# Patient Record
Sex: Male | Born: 1935 | Race: White | Hispanic: No | Smoking: Former smoker
Health system: Southern US, Community
[De-identification: ages and names within clinical notes are randomized; demographics above are authoritative.]

## PROBLEM LIST (undated history)

## (undated) DIAGNOSIS — T8859XA Other complications of anesthesia, initial encounter: Secondary | ICD-10-CM

## (undated) DIAGNOSIS — I509 Heart failure, unspecified: Secondary | ICD-10-CM

## (undated) DIAGNOSIS — I10 Essential (primary) hypertension: Secondary | ICD-10-CM

## (undated) DIAGNOSIS — K219 Gastro-esophageal reflux disease without esophagitis: Secondary | ICD-10-CM

## (undated) DIAGNOSIS — I639 Cerebral infarction, unspecified: Secondary | ICD-10-CM

## (undated) DIAGNOSIS — C801 Malignant (primary) neoplasm, unspecified: Secondary | ICD-10-CM

## (undated) DIAGNOSIS — M1990 Unspecified osteoarthritis, unspecified site: Secondary | ICD-10-CM

## (undated) DIAGNOSIS — F419 Anxiety disorder, unspecified: Secondary | ICD-10-CM

## (undated) DIAGNOSIS — R112 Nausea with vomiting, unspecified: Secondary | ICD-10-CM

## (undated) DIAGNOSIS — K5730 Diverticulosis of large intestine without perforation or abscess without bleeding: Secondary | ICD-10-CM

## (undated) DIAGNOSIS — I499 Cardiac arrhythmia, unspecified: Secondary | ICD-10-CM

## (undated) DIAGNOSIS — E119 Type 2 diabetes mellitus without complications: Secondary | ICD-10-CM

## (undated) DIAGNOSIS — J189 Pneumonia, unspecified organism: Secondary | ICD-10-CM

## (undated) DIAGNOSIS — Z9889 Other specified postprocedural states: Secondary | ICD-10-CM

## (undated) DIAGNOSIS — T4145XA Adverse effect of unspecified anesthetic, initial encounter: Secondary | ICD-10-CM

## (undated) HISTORY — PX: EYE SURGERY: SHX253

## (undated) HISTORY — PX: SHOULDER FUSION: SUR625

## (undated) HISTORY — DX: Type 2 diabetes mellitus without complications: E11.9

## (undated) HISTORY — PX: HERNIA REPAIR: SHX51

## (undated) HISTORY — PX: JOINT REPLACEMENT: SHX530

---

## 1989-06-01 HISTORY — PX: BACK SURGERY: SHX140

## 2003-06-02 DIAGNOSIS — I639 Cerebral infarction, unspecified: Secondary | ICD-10-CM

## 2003-06-02 HISTORY — DX: Cerebral infarction, unspecified: I63.9

## 2012-06-03 ENCOUNTER — Other Ambulatory Visit: Payer: Self-pay | Admitting: Neurosurgery

## 2012-06-10 ENCOUNTER — Encounter (HOSPITAL_COMMUNITY)
Admission: RE | Admit: 2012-06-10 | Discharge: 2012-06-10 | Disposition: A | Discharge status: Home / Self Care | Payer: Medicare Other | Source: Ambulatory Visit | Attending: Neurosurgery | Admitting: Neurosurgery

## 2012-06-10 ENCOUNTER — Encounter (HOSPITAL_COMMUNITY): Payer: Self-pay

## 2012-06-10 HISTORY — DX: Cardiac arrhythmia, unspecified: I49.9

## 2012-06-10 HISTORY — DX: Nausea with vomiting, unspecified: Z98.890

## 2012-06-10 HISTORY — DX: Malignant (primary) neoplasm, unspecified: C80.1

## 2012-06-10 HISTORY — DX: Anxiety disorder, unspecified: F41.9

## 2012-06-10 HISTORY — DX: Other complications of anesthesia, initial encounter: T88.59XA

## 2012-06-10 HISTORY — DX: Essential (primary) hypertension: I10

## 2012-06-10 HISTORY — DX: Gastro-esophageal reflux disease without esophagitis: K21.9

## 2012-06-10 HISTORY — DX: Nausea with vomiting, unspecified: R11.2

## 2012-06-10 HISTORY — DX: Heart failure, unspecified: I50.9

## 2012-06-10 HISTORY — DX: Pneumonia, unspecified organism: J18.9

## 2012-06-10 HISTORY — DX: Diverticulosis of large intestine without perforation or abscess without bleeding: K57.30

## 2012-06-10 HISTORY — DX: Unspecified osteoarthritis, unspecified site: M19.90

## 2012-06-10 HISTORY — DX: Adverse effect of unspecified anesthetic, initial encounter: T41.45XA

## 2012-06-10 HISTORY — DX: Cerebral infarction, unspecified: I63.9

## 2012-06-10 LAB — BASIC METABOLIC PANEL
BUN: 14 mg/dL (ref 6–23)
CO2: 32 mEq/L (ref 19–32)
Calcium: 9.3 mg/dL (ref 8.4–10.5)
Chloride: 99 mEq/L (ref 96–112)
Creatinine, Ser: 0.92 mg/dL (ref 0.50–1.35)
GFR calc Af Amer: >90 mL/min (ref >90)
GFR calc non Af Amer: 78 mL/min — ABNORMAL LOW (ref >90)
Glucose, Bld: 122 mg/dL — ABNORMAL HIGH (ref 70–99)
Potassium: 3.6 mEq/L (ref 3.5–5.1)
Sodium: 141 mEq/L (ref 135–145)

## 2012-06-10 LAB — CBC
HCT: 34.4 % — ABNORMAL LOW (ref 39.0–52.0)
Hemoglobin: 11 g/dL — ABNORMAL LOW (ref 13.0–17.0)
MCH: 28.8 pg (ref 26.0–34.0)
MCHC: 32 g/dL (ref 30.0–36.0)
MCV: 90.1 fL (ref 78.0–100.0)
Platelets: 219 10*3/uL (ref 150–400)
RBC: 3.82 MIL/uL — ABNORMAL LOW (ref 4.22–5.81)
RDW: 14.2 % (ref 11.5–15.5)
WBC: 7.9 10*3/uL (ref 4.0–10.5)

## 2012-06-10 LAB — SURGICAL PCR SCREEN
MRSA, PCR: NEGATIVE
Staphylococcus aureus: NEGATIVE

## 2012-06-10 LAB — ABO/RH: ABO/RH(D): O NEG

## 2012-06-10 NOTE — Progress Notes (Signed)
I requested Chest XRay from [facility].   I requested EKG, Stress Test, Office note with cardiac clearance.  Pt had some personality change after receiving Versed and Fentyl at [place] [facility].  I faxed a request to [facility] requesting D/C notes and anesthesia notes.

## 2012-06-10 NOTE — Pre-Procedure Instructions (Addendum)
Phillip Chandler  06/10/2012   Your procedure is scheduled on:  Monday, January 20th  Report to [facility] at 5:30AM.   Call this number if you have problems the morning of surgery: [phone]   Remember:   Do not eat food or drink liquids after midnight.   Take these medicines the morning of surgery with A SIP OF WATER: - Pacerone (Amiodarone),Omeprazole, Clonidine, Labetalol .  May take Hydrocodone- Acetaminophen and Lyrica if needed.    Do not wear jewelry, make-up or nail polish.  Do not wear lotions, powders, or perfumes. You may wear deodorant.  Do not shave 48 hours prior to surgery. Men may shave face and neck.  Do not bring valuables to the hospital.  Contacts, dentures or bridgework may not be worn into surgery.  Leave suitcase in the car. After surgery it may be brought to your room.  For patients admitted to the hospital, checkout time is 11:00 AM the day of  discharge.   Patients discharged the day of surgery will not be allowed to drive home.  Name and phone number of your driver: NA                 Special Instructions: Shower using CHG 2 nights before surgery and the night before surgery.  If you shower the day of surgery use CHG.  Use special wash - you have one bottle of CHG for all showers.  You should use approximately 1/3 of the bottle for each shower.    Please read over the following fact sheets that you were given: Pain Booklet, Coughing and Deep Breathing, Blood Transfusion Information and Surgical Site Infection Prevention

## 2012-06-14 ENCOUNTER — Encounter (HOSPITAL_COMMUNITY): Payer: Self-pay | Admitting: Vascular Surgery

## 2012-06-14 NOTE — Consult Note (Signed)
Anesthesia Chart Review:  Patient is a 79 year old male scheduled for a two level PLIF by Dr. Wynetta Emery on 06/20/12.  History includes former smoker, HTN, DM type 2, CVA '05, CHF, GERD, PNA 10/2011, anxiety, arthritis, afib/PAF not on anticoagulation, obesity, skin cancer, diverticulosis, post operative nausea/vomiting.  He also reported a personality change after receiving Versed and Fentyl in the past.  PCP is Dr. Josiah Lobo.  BP was 190/98 at PAT (automated).  Unfortunately, it was not rechecked.  I called him today.  He reports compliance with his BP meds.  His daughter is a Engineer, civil (consulting) and took a manual BP this weekend and reportedly got 130's/60-70's which would correlate with his 06/06/12 BP at the cardiologist's office.  He reports a smaller cuff was used in PAT, and he needs a large/long cuff.  I'll notify our nurse tech in Short Stay to see if he/she can have the appropriate size cuff available on the day of surgery.  (I left a voicemail with Erie Noe at Dr. Lonie Peak office regarding elevated BP at PAT.)  Medications include Tekturna 300mg  daily, amiodarone 200mg  daily, clonidine 0.1mg  BID, Lasix 20mg  BID, labetalol 200mg  BID, Zyrtec, Flonase, Norce, mesalamine, metformin, omeprazole, Lyrica, simvastatin.   Patient was seen by Cardiologist Dr. Wille Glaser at [facility]) in [place] in 05/2012 for a preoperative evaluation.  Patient had a low risk stress, but his BP was poorly controlled.  He increased his labetalol and wanted patient to wait until after 06/03/12.  Patient was seen in follow up on 06/06/12 with BP 136/76 and felt stable for surgery.  By notes, EKG during 05/23/12 stress test showed NSR, poor anterior r wave progression.  The last EKG sent was from 03/28/09 however.  Short Stay nursing staff to follow up.  He will need an EKG on the day of surgery if one not received within the past year.   Nuclear stress test on 05/23/12 showed marked resting HTN (200/103 to 204/89),  technically suboptimal images as expected due to patient's size, but no reversible segmental defects identified, borderline LVEF of 49% during marked HTN.  Per Dr. Deedra Ehrich 05/23/12 note, patient is low/acceptable risk for surgery once BP control is assured.  CXR on 10/26/11 [facility]) showed cardiac enlargement without vascular congestion.  There is evidence of airspace infiltration or atelectasis in the lung bases.  Will repeat CXR on the day of surgery.  Preoperative labs noted.  If no significant change in his status, CXR unremarkable, and BP reasonable then would anticipate he can proceed.  Shonna Chock, PA-C 06/14/12 1416

## 2012-06-16 ENCOUNTER — Other Ambulatory Visit: Payer: Self-pay | Admitting: Neurosurgery

## 2012-06-16 NOTE — Progress Notes (Signed)
Vanessa @Dr . Cram's office notified of need for orders.  Erie Noe stated she would notify Dr. Wynetta Emery.

## 2012-06-17 NOTE — Progress Notes (Signed)
Notified pt. Of time change for surgery on Monday, Jan. 20, instructed him to arrive at 1000.  Notified Erie Noe that  Dr. Wynetta Emery has not signed orders.

## 2012-06-17 NOTE — Progress Notes (Signed)
Notified patient of new surgery time 7:30am, new arrival 5:30am.

## 2012-06-20 ENCOUNTER — Encounter (HOSPITAL_COMMUNITY)
Admission: RE | Disposition: A | Discharge status: Skilled Nursing Facility | Payer: Self-pay | Source: Ambulatory Visit | Attending: Neurosurgery

## 2012-06-20 ENCOUNTER — Encounter (HOSPITAL_COMMUNITY): Payer: Self-pay | Admitting: *Deleted

## 2012-06-20 ENCOUNTER — Encounter (HOSPITAL_COMMUNITY): Payer: Self-pay | Admitting: Vascular Surgery

## 2012-06-20 ENCOUNTER — Inpatient Hospital Stay (HOSPITAL_COMMUNITY): Payer: Medicare Other | Admitting: Vascular Surgery

## 2012-06-20 ENCOUNTER — Inpatient Hospital Stay (HOSPITAL_COMMUNITY): Payer: Medicare Other

## 2012-06-20 ENCOUNTER — Inpatient Hospital Stay (HOSPITAL_COMMUNITY)
Admission: RE | Admit: 2012-06-20 | Discharge: 2012-06-28 | DRG: 459 | Disposition: A | Discharge status: Skilled Nursing Facility | Payer: Medicare Other | Source: Ambulatory Visit | Attending: Neurosurgery | Admitting: Neurosurgery

## 2012-06-20 DIAGNOSIS — Z888 Allergy status to other drugs, medicaments and biological substances status: Secondary | ICD-10-CM

## 2012-06-20 DIAGNOSIS — I639 Cerebral infarction, unspecified: Secondary | ICD-10-CM

## 2012-06-20 DIAGNOSIS — F419 Anxiety disorder, unspecified: Secondary | ICD-10-CM

## 2012-06-20 DIAGNOSIS — Z882 Allergy status to sulfonamides status: Secondary | ICD-10-CM

## 2012-06-20 DIAGNOSIS — E669 Obesity, unspecified: Secondary | ICD-10-CM | POA: Diagnosis present

## 2012-06-20 DIAGNOSIS — M47817 Spondylosis without myelopathy or radiculopathy, lumbosacral region: Principal | ICD-10-CM | POA: Diagnosis present

## 2012-06-20 DIAGNOSIS — Z87891 Personal history of nicotine dependence: Secondary | ICD-10-CM

## 2012-06-20 DIAGNOSIS — I10 Essential (primary) hypertension: Secondary | ICD-10-CM | POA: Diagnosis present

## 2012-06-20 DIAGNOSIS — E876 Hypokalemia: Secondary | ICD-10-CM | POA: Diagnosis not present

## 2012-06-20 DIAGNOSIS — Z8673 Personal history of transient ischemic attack (TIA), and cerebral infarction without residual deficits: Secondary | ICD-10-CM

## 2012-06-20 DIAGNOSIS — Z01812 Encounter for preprocedural laboratory examination: Secondary | ICD-10-CM

## 2012-06-20 DIAGNOSIS — I4891 Unspecified atrial fibrillation: Secondary | ICD-10-CM | POA: Diagnosis not present

## 2012-06-20 DIAGNOSIS — Z6841 Body Mass Index (BMI) 40.0 and over, adult: Secondary | ICD-10-CM

## 2012-06-20 DIAGNOSIS — E119 Type 2 diabetes mellitus without complications: Secondary | ICD-10-CM | POA: Diagnosis present

## 2012-06-20 DIAGNOSIS — I509 Heart failure, unspecified: Secondary | ICD-10-CM | POA: Diagnosis present

## 2012-06-20 DIAGNOSIS — M4310 Spondylolisthesis, site unspecified: Secondary | ICD-10-CM | POA: Diagnosis present

## 2012-06-20 DIAGNOSIS — M48062 Spinal stenosis, lumbar region with neurogenic claudication: Secondary | ICD-10-CM | POA: Diagnosis present

## 2012-06-20 DIAGNOSIS — Z96659 Presence of unspecified artificial knee joint: Secondary | ICD-10-CM

## 2012-06-20 DIAGNOSIS — F05 Delirium due to known physiological condition: Secondary | ICD-10-CM | POA: Diagnosis not present

## 2012-06-20 DIAGNOSIS — I5031 Acute diastolic (congestive) heart failure: Secondary | ICD-10-CM | POA: Diagnosis not present

## 2012-06-20 DIAGNOSIS — K219 Gastro-esophageal reflux disease without esophagitis: Secondary | ICD-10-CM | POA: Diagnosis present

## 2012-06-20 DIAGNOSIS — F411 Generalized anxiety disorder: Secondary | ICD-10-CM | POA: Diagnosis present

## 2012-06-20 DIAGNOSIS — Z79899 Other long term (current) drug therapy: Secondary | ICD-10-CM

## 2012-06-20 LAB — CBC
HCT: 30.7 % — ABNORMAL LOW (ref 39.0–52.0)
Hemoglobin: 10.2 g/dL — ABNORMAL LOW (ref 13.0–17.0)
MCH: 29.3 pg (ref 26.0–34.0)
MCHC: 33.2 g/dL (ref 30.0–36.0)
MCV: 88.2 fL (ref 78.0–100.0)
Platelets: 203 10*3/uL (ref 150–400)
RBC: 3.48 MIL/uL — ABNORMAL LOW (ref 4.22–5.81)
RDW: 14.4 % (ref 11.5–15.5)
WBC: 11.2 10*3/uL — ABNORMAL HIGH (ref 4.0–10.5)

## 2012-06-20 LAB — PROTIME-INR
INR: 0.98 (ref 0.00–1.49)
Prothrombin Time: 12.9 seconds (ref 11.6–15.2)

## 2012-06-20 LAB — GLUCOSE, CAPILLARY
Glucose-Capillary: 123 mg/dL — ABNORMAL HIGH (ref 70–99)
Glucose-Capillary: 119 mg/dL — ABNORMAL HIGH (ref 70–99)
Glucose-Capillary: 131 mg/dL — ABNORMAL HIGH (ref 70–99)
Glucose-Capillary: 128 mg/dL — ABNORMAL HIGH (ref 70–99)

## 2012-06-20 SURGERY — POSTERIOR LUMBAR FUSION 2 LEVEL
Anesthesia: General | Site: Back | Wound class: Clean

## 2012-06-20 MED ORDER — CEFAZOLIN SODIUM 1-5 GM-% IV SOLN
1.0000 g | Freq: Three times a day (TID) | INTRAVENOUS | Status: AC
  Administered 2012-06-20 – 2012-06-21 (×2): 1 g via INTRAVENOUS
  Filled 2012-06-20 (×2): qty 50

## 2012-06-20 MED ORDER — 0.9 % SODIUM CHLORIDE (POUR BTL) OPTIME
TOPICAL | Status: DC | PRN
  Administered 2012-06-20: 1000 mL

## 2012-06-20 MED ORDER — PHENOL 1.4 % MT LIQD: 1.0000 | OROMUCOSAL | Status: DC | PRN

## 2012-06-20 MED ORDER — SODIUM CHLORIDE 0.9 % IJ SOLN
3.0000 mL | INTRAMUSCULAR | Status: DC | PRN
  Administered 2012-06-20: 3 mL via INTRAVENOUS

## 2012-06-20 MED ORDER — HYDROMORPHONE HCL PF 1 MG/ML IJ SOLN
INTRAMUSCULAR | Status: AC
  Filled 2012-06-20: qty 1

## 2012-06-20 MED ORDER — SODIUM CHLORIDE 0.9 % IR SOLN
Status: DC | PRN
  Administered 2012-06-20: 13:00:00

## 2012-06-20 MED ORDER — ACETAMINOPHEN 325 MG PO TABS
650.0000 mg | ORAL_TABLET | ORAL | Status: DC | PRN
  Administered 2012-06-21 – 2012-06-27 (×5): 650 mg via ORAL
  Filled 2012-06-20 (×6): qty 2

## 2012-06-20 MED ORDER — NEOSTIGMINE METHYLSULFATE 1 MG/ML IJ SOLN
INTRAMUSCULAR | Status: DC | PRN
  Administered 2012-06-20: 5 mg via INTRAVENOUS

## 2012-06-20 MED ORDER — CEFAZOLIN SODIUM-DEXTROSE 2-3 GM-% IV SOLR
INTRAVENOUS | Status: AC
  Administered 2012-06-20 (×2): 2 g via INTRAVENOUS
  Filled 2012-06-20: qty 50

## 2012-06-20 MED ORDER — LABETALOL HCL 200 MG PO TABS
200.0000 mg | ORAL_TABLET | Freq: Two times a day (BID) | ORAL | Status: DC
  Administered 2012-06-20 – 2012-06-22 (×3): 200 mg via ORAL
  Filled 2012-06-20 (×7): qty 1

## 2012-06-20 MED ORDER — ROCURONIUM BROMIDE 100 MG/10ML IV SOLN
INTRAVENOUS | Status: DC | PRN
  Administered 2012-06-20: 50 mg via INTRAVENOUS

## 2012-06-20 MED ORDER — DOCUSATE SODIUM 100 MG PO CAPS
100.0000 mg | ORAL_CAPSULE | Freq: Two times a day (BID) | ORAL | Status: DC
  Administered 2012-06-20 – 2012-06-28 (×11): 100 mg via ORAL
  Filled 2012-06-20 (×17): qty 1

## 2012-06-20 MED ORDER — HYDROCODONE-ACETAMINOPHEN 7.5-325 MG PO TABS
1.0000 | ORAL_TABLET | ORAL | Status: DC | PRN
Start: 2012-06-20 — End: 2012-06-28
  Administered 2012-06-21 – 2012-06-22 (×2): 1 via ORAL
  Filled 2012-06-20 (×3): qty 1

## 2012-06-20 MED ORDER — ACETAMINOPHEN 650 MG RE SUPP
650.0000 mg | RECTAL | Status: DC | PRN
  Administered 2012-06-22 – 2012-06-24 (×4): 650 mg via RECTAL
  Filled 2012-06-20 (×5): qty 1

## 2012-06-20 MED ORDER — ATORVASTATIN CALCIUM 20 MG PO TABS
20.0000 mg | ORAL_TABLET | Freq: Every day | ORAL | Status: DC
  Administered 2012-06-20 – 2012-06-27 (×5): 20 mg via ORAL
  Filled 2012-06-20 (×9): qty 1

## 2012-06-20 MED ORDER — SODIUM CHLORIDE 0.9 % IV SOLN
INTRAVENOUS | Status: AC
  Filled 2012-06-20: qty 500

## 2012-06-20 MED ORDER — GLYCOPYRROLATE 0.2 MG/ML IJ SOLN
INTRAMUSCULAR | Status: DC | PRN
  Administered 2012-06-20: .8 mg via INTRAVENOUS
  Administered 2012-06-20: 0.2 mg via INTRAVENOUS

## 2012-06-20 MED ORDER — BUPIVACAINE HCL (PF) 0.25 % IJ SOLN
INTRAMUSCULAR | Status: DC | PRN
  Administered 2012-06-20: 18 mL

## 2012-06-20 MED ORDER — CYCLOBENZAPRINE HCL 10 MG PO TABS
10.0000 mg | ORAL_TABLET | Freq: Three times a day (TID) | ORAL | Status: DC | PRN
  Administered 2012-06-22: 10 mg via ORAL
  Filled 2012-06-20: qty 1

## 2012-06-20 MED ORDER — PANTOPRAZOLE SODIUM 40 MG PO TBEC
40.0000 mg | DELAYED_RELEASE_TABLET | Freq: Every day | ORAL | Status: DC
  Administered 2012-06-21 – 2012-06-22 (×2): 40 mg via ORAL
  Filled 2012-06-20 (×2): qty 1

## 2012-06-20 MED ORDER — LACTATED RINGERS IV SOLN
INTRAVENOUS | Status: DC | PRN
  Administered 2012-06-20: 11:00:00 via INTRAVENOUS

## 2012-06-20 MED ORDER — ACETAMINOPHEN 10 MG/ML IV SOLN
1000.0000 mg | Freq: Once | INTRAVENOUS | Status: AC | PRN
  Administered 2012-06-20: 1000 mg via INTRAVENOUS

## 2012-06-20 MED ORDER — BACITRACIN 50000 UNITS IM SOLR
INTRAMUSCULAR | Status: AC
  Filled 2012-06-20: qty 1

## 2012-06-20 MED ORDER — ACETAMINOPHEN 10 MG/ML IV SOLN
INTRAVENOUS | Status: AC
  Filled 2012-06-20: qty 100

## 2012-06-20 MED ORDER — FLUTICASONE PROPIONATE 50 MCG/ACT NA SUSP
2.0000 | Freq: Every day | NASAL | Status: DC
  Administered 2012-06-21 – 2012-06-28 (×3): 2 via NASAL
  Filled 2012-06-20 (×2): qty 16

## 2012-06-20 MED ORDER — SODIUM CHLORIDE 0.9 % IJ SOLN
3.0000 mL | Freq: Two times a day (BID) | INTRAMUSCULAR | Status: DC
  Administered 2012-06-20 – 2012-06-27 (×14): 3 mL via INTRAVENOUS

## 2012-06-20 MED ORDER — PROPOFOL 10 MG/ML IV BOLUS
INTRAVENOUS | Status: DC | PRN
  Administered 2012-06-20: 160 mg via INTRAVENOUS

## 2012-06-20 MED ORDER — POTASSIUM CHLORIDE IN NACL 20-0.45 MEQ/L-% IV SOLN
INTRAVENOUS | Status: DC
  Administered 2012-06-20 – 2012-06-21 (×2): via INTRAVENOUS
  Filled 2012-06-20 (×3): qty 1000

## 2012-06-20 MED ORDER — LIDOCAINE HCL (CARDIAC) 20 MG/ML IV SOLN
INTRAVENOUS | Status: DC | PRN
  Administered 2012-06-20: 60 mg via INTRAVENOUS

## 2012-06-20 MED ORDER — LIDOCAINE-EPINEPHRINE 1 %-1:100000 IJ SOLN
INTRAMUSCULAR | Status: DC | PRN
  Administered 2012-06-20: 10 mL

## 2012-06-20 MED ORDER — AMIODARONE HCL 200 MG PO TABS
200.0000 mg | ORAL_TABLET | Freq: Every day | ORAL | Status: DC
  Administered 2012-06-21 – 2012-06-22 (×2): 200 mg via ORAL
  Filled 2012-06-20 (×3): qty 1

## 2012-06-20 MED ORDER — OXYCODONE-ACETAMINOPHEN 5-325 MG PO TABS: 1.0000 | ORAL_TABLET | ORAL | Status: DC | PRN

## 2012-06-20 MED ORDER — LORATADINE 10 MG PO TABS
10.0000 mg | ORAL_TABLET | Freq: Every day | ORAL | Status: DC
  Administered 2012-06-20: 10 mg via ORAL
  Filled 2012-06-20: qty 1

## 2012-06-20 MED ORDER — VECURONIUM BROMIDE 10 MG IV SOLR
INTRAVENOUS | Status: DC | PRN
  Administered 2012-06-20: 2 mg via INTRAVENOUS
  Administered 2012-06-20: 1 mg via INTRAVENOUS
  Administered 2012-06-20: 2 mg via INTRAVENOUS
  Administered 2012-06-20 (×2): 1 mg via INTRAVENOUS

## 2012-06-20 MED ORDER — METFORMIN HCL ER 500 MG PO TB24
500.0000 mg | ORAL_TABLET | Freq: Every day | ORAL | Status: DC
  Filled 2012-06-20: qty 1

## 2012-06-20 MED ORDER — ONDANSETRON HCL 4 MG/2ML IJ SOLN
INTRAMUSCULAR | Status: DC | PRN
  Administered 2012-06-20: 4 mg via INTRAVENOUS

## 2012-06-20 MED ORDER — MENTHOL 3 MG MT LOZG: 1.0000 | LOZENGE | OROMUCOSAL | Status: DC | PRN

## 2012-06-20 MED ORDER — LACTATED RINGERS IV SOLN
INTRAVENOUS | Status: DC | PRN
  Administered 2012-06-20: 12:00:00 via INTRAVENOUS

## 2012-06-20 MED ORDER — ALUM & MAG HYDROXIDE-SIMETH 200-200-20 MG/5ML PO SUSP: 30.0000 mL | Freq: Four times a day (QID) | ORAL | Status: DC | PRN

## 2012-06-20 MED ORDER — SODIUM CHLORIDE 0.9 % IV SOLN: 250.0000 mL | INTRAVENOUS | Status: DC

## 2012-06-20 MED ORDER — THROMBIN 20000 UNITS EX SOLR
CUTANEOUS | Status: DC | PRN
  Administered 2012-06-20 (×2): via TOPICAL

## 2012-06-20 MED ORDER — FENTANYL CITRATE 0.05 MG/ML IJ SOLN
INTRAMUSCULAR | Status: DC | PRN
  Administered 2012-06-20 (×3): 50 ug via INTRAVENOUS

## 2012-06-20 MED ORDER — ALISKIREN FUMARATE 150 MG PO TABS
300.0000 mg | ORAL_TABLET | Freq: Every day | ORAL | Status: DC
  Administered 2012-06-21: 300 mg via ORAL
  Filled 2012-06-20: qty 2

## 2012-06-20 MED ORDER — PREGABALIN 50 MG PO CAPS
100.0000 mg | ORAL_CAPSULE | Freq: Four times a day (QID) | ORAL | Status: DC
  Administered 2012-06-20 – 2012-06-28 (×20): 100 mg via ORAL
  Filled 2012-06-20 (×8): qty 2
  Filled 2012-06-20: qty 1
  Filled 2012-06-20 (×2): qty 2
  Filled 2012-06-20: qty 1
  Filled 2012-06-20 (×2): qty 2
  Filled 2012-06-20: qty 1
  Filled 2012-06-20 (×7): qty 2

## 2012-06-20 MED ORDER — ONDANSETRON HCL 4 MG/2ML IJ SOLN
4.0000 mg | INTRAMUSCULAR | Status: DC | PRN
  Administered 2012-06-20: 4 mg via INTRAVENOUS
  Filled 2012-06-20: qty 2

## 2012-06-20 MED ORDER — ONDANSETRON HCL 4 MG/2ML IJ SOLN: 4.0000 mg | Freq: Once | INTRAMUSCULAR | Status: DC | PRN

## 2012-06-20 MED ORDER — SIMVASTATIN 40 MG PO TABS: 40.0000 mg | ORAL_TABLET | Freq: Every evening | ORAL | Status: DC

## 2012-06-20 MED ORDER — HYDROMORPHONE HCL PF 1 MG/ML IJ SOLN
0.2500 mg | INTRAMUSCULAR | Status: DC | PRN
  Administered 2012-06-20 (×2): 0.5 mg via INTRAVENOUS

## 2012-06-20 MED ORDER — ARTIFICIAL TEARS OP OINT
TOPICAL_OINTMENT | OPHTHALMIC | Status: DC | PRN
  Administered 2012-06-20: 1 via OPHTHALMIC

## 2012-06-20 MED ORDER — MESALAMINE 1.2 G PO TBEC
1200.0000 mg | DELAYED_RELEASE_TABLET | Freq: Every day | ORAL | Status: DC
  Administered 2012-06-21 – 2012-06-28 (×6): 1.2 g via ORAL
  Filled 2012-06-20 (×9): qty 1

## 2012-06-20 MED ORDER — PHENYLEPHRINE HCL 10 MG/ML IJ SOLN
10.0000 mg | INTRAVENOUS | Status: DC | PRN
  Administered 2012-06-20: 20 ug/min via INTRAVENOUS

## 2012-06-20 MED ORDER — FUROSEMIDE 20 MG PO TABS
20.0000 mg | ORAL_TABLET | Freq: Two times a day (BID) | ORAL | Status: DC
  Administered 2012-06-20 – 2012-06-21 (×2): 20 mg via ORAL
  Filled 2012-06-20 (×3): qty 1

## 2012-06-20 MED ORDER — CEFAZOLIN SODIUM-DEXTROSE 2-3 GM-% IV SOLR
INTRAVENOUS | Status: AC
  Filled 2012-06-20: qty 50

## 2012-06-20 MED ORDER — CLONIDINE HCL 0.1 MG PO TABS
0.1000 mg | ORAL_TABLET | Freq: Two times a day (BID) | ORAL | Status: DC
  Administered 2012-06-20 – 2012-06-22 (×3): 0.1 mg via ORAL
  Filled 2012-06-20 (×9): qty 1

## 2012-06-20 MED ORDER — HYDROMORPHONE HCL PF 1 MG/ML IJ SOLN
0.5000 mg | INTRAMUSCULAR | Status: DC | PRN
  Administered 2012-06-20: 0.5 mg via INTRAVENOUS
  Administered 2012-06-21 (×2): 1 mg via INTRAVENOUS
  Administered 2012-06-21: 0.5 mg via INTRAVENOUS
  Administered 2012-06-21 (×3): 1 mg via INTRAVENOUS
  Administered 2012-06-22 (×3): 0.5 mg via INTRAVENOUS
  Filled 2012-06-20 (×11): qty 1

## 2012-06-20 MED ORDER — ACETAMINOPHEN 10 MG/ML IV SOLN
INTRAVENOUS | Status: AC
  Administered 2012-06-20: 1000 mg via INTRAVENOUS
  Filled 2012-06-20: qty 100

## 2012-06-20 MED ORDER — ALBUMIN HUMAN 5 % IV SOLN
INTRAVENOUS | Status: DC | PRN
  Administered 2012-06-20 (×3): via INTRAVENOUS

## 2012-06-20 MED ORDER — INSULIN ASPART 100 UNIT/ML ~~LOC~~ SOLN
0.0000 [IU] | Freq: Three times a day (TID) | SUBCUTANEOUS | Status: DC
  Administered 2012-06-21 – 2012-06-22 (×3): 1 [IU] via SUBCUTANEOUS
  Administered 2012-06-23 (×2): 2 [IU] via SUBCUTANEOUS
  Administered 2012-06-23 – 2012-06-24 (×2): 1 [IU] via SUBCUTANEOUS
  Administered 2012-06-24 – 2012-06-25 (×2): 2 [IU] via SUBCUTANEOUS
  Administered 2012-06-25 – 2012-06-26 (×4): 1 [IU] via SUBCUTANEOUS
  Administered 2012-06-26: 3 [IU] via SUBCUTANEOUS
  Administered 2012-06-27 – 2012-06-28 (×4): 1 [IU] via SUBCUTANEOUS

## 2012-06-20 SURGICAL SUPPLY — 79 items
BAG DECANTER FOR FLEXI CONT (MISCELLANEOUS) ×2 IMPLANT
BENZOIN TINCTURE PRP APPL 2/3 (GAUZE/BANDAGES/DRESSINGS) ×2 IMPLANT
BLADE SURG 11 STRL SS (BLADE) ×2 IMPLANT
BLADE SURG ROTATE 9660 (MISCELLANEOUS) IMPLANT
BRUSH SCRUB EZ PLAIN DRY (MISCELLANEOUS) ×2 IMPLANT
BUR MATCHSTICK NEURO 3.0 LAGG (BURR) ×2 IMPLANT
BUR PRECISION FLUTE 6.0 (BURR) ×2 IMPLANT
CANISTER SUCTION 2500CC (MISCELLANEOUS) ×2 IMPLANT
CAP LOCKING REVERE (Cap) ×12 IMPLANT
CLOTH BEACON ORANGE TIMEOUT ST (SAFETY) ×2 IMPLANT
CONNECTOR VAR CROSS 5.5X48-60 (Connector) ×2 IMPLANT
CONT SPEC 4OZ CLIKSEAL STRL BL (MISCELLANEOUS) ×4 IMPLANT
COVER BACK TABLE 24X17X13 BIG (DRAPES) IMPLANT
COVER TABLE BACK 60X90 (DRAPES) ×2 IMPLANT
DECANTER SPIKE VIAL GLASS SM (MISCELLANEOUS) ×2 IMPLANT
DERMABOND ADHESIVE PROPEN (GAUZE/BANDAGES/DRESSINGS) ×1
DERMABOND ADVANCED (GAUZE/BANDAGES/DRESSINGS)
DERMABOND ADVANCED .7 DNX12 (GAUZE/BANDAGES/DRESSINGS) IMPLANT
DERMABOND ADVANCED .7 DNX6 (GAUZE/BANDAGES/DRESSINGS) ×1 IMPLANT
DRAPE C-ARM 42X72 X-RAY (DRAPES) ×4 IMPLANT
DRAPE LAPAROTOMY 100X72X124 (DRAPES) ×2 IMPLANT
DRAPE POUCH INSTRU U-SHP 10X18 (DRAPES) ×2 IMPLANT
DRAPE PROXIMA HALF (DRAPES) IMPLANT
DRAPE SURG 17X23 STRL (DRAPES) ×2 IMPLANT
DRESSING TELFA 8X3 (GAUZE/BANDAGES/DRESSINGS) ×2 IMPLANT
DRSG OPSITE 4X5.5 SM (GAUZE/BANDAGES/DRESSINGS) ×2 IMPLANT
DURAPREP 26ML APPLICATOR (WOUND CARE) ×2 IMPLANT
ELECT REM PT RETURN 9FT ADLT (ELECTROSURGICAL) ×2
ELECTRODE REM PT RTRN 9FT ADLT (ELECTROSURGICAL) ×1 IMPLANT
EVACUATOR 3/16  PVC DRAIN (DRAIN) ×1
EVACUATOR 3/16 PVC DRAIN (DRAIN) ×1 IMPLANT
GAUZE SPONGE 4X4 16PLY XRAY LF (GAUZE/BANDAGES/DRESSINGS) ×2 IMPLANT
GLOVE BIO SURGEON STRL SZ8 (GLOVE) ×4 IMPLANT
GLOVE BIOGEL PI IND STRL 7.0 (GLOVE) ×3 IMPLANT
GLOVE BIOGEL PI IND STRL 7.5 (GLOVE) ×2 IMPLANT
GLOVE BIOGEL PI INDICATOR 7.0 (GLOVE) ×3
GLOVE BIOGEL PI INDICATOR 7.5 (GLOVE) ×2
GLOVE ECLIPSE 7.5 STRL STRAW (GLOVE) ×4 IMPLANT
GLOVE EXAM NITRILE LRG STRL (GLOVE) IMPLANT
GLOVE EXAM NITRILE MD LF STRL (GLOVE) ×4 IMPLANT
GLOVE EXAM NITRILE XL STR (GLOVE) IMPLANT
GLOVE EXAM NITRILE XS STR PU (GLOVE) IMPLANT
GLOVE INDICATOR 8.5 STRL (GLOVE) ×4 IMPLANT
GLOVE SS BIOGEL STRL SZ 6.5 (GLOVE) ×4 IMPLANT
GLOVE SUPERSENSE BIOGEL SZ 6.5 (GLOVE) ×4
GOWN BRE IMP SLV AUR LG STRL (GOWN DISPOSABLE) ×4 IMPLANT
GOWN BRE IMP SLV AUR XL STRL (GOWN DISPOSABLE) ×10 IMPLANT
GOWN STRL REIN 2XL LVL4 (GOWN DISPOSABLE) IMPLANT
KIT BASIN OR (CUSTOM PROCEDURE TRAY) ×2 IMPLANT
KIT INFUSE SMALL (Orthopedic Implant) ×2 IMPLANT
KIT ROOM TURNOVER OR (KITS) ×2 IMPLANT
MILL MEDIUM DISP (BLADE) ×2 IMPLANT
MIX DBX 10CC 35% BONE (Bone Implant) ×2 IMPLANT
NEEDLE HYPO 25X1 1.5 SAFETY (NEEDLE) ×2 IMPLANT
NS IRRIG 1000ML POUR BTL (IV SOLUTION) ×2 IMPLANT
PACK LAMINECTOMY NEURO (CUSTOM PROCEDURE TRAY) ×2 IMPLANT
PAD ARMBOARD 7.5X6 YLW CONV (MISCELLANEOUS) ×6 IMPLANT
PATTIES SURGICAL 1X1 (DISPOSABLE) ×2 IMPLANT
ROD CURVED 5.5MMX75MM (Rod) ×2 IMPLANT
ROD STRAIGHT 5.5MMX75MM (Rod) ×2 IMPLANT
SCREW PEDICLE 6.5MMX45MM (Screw) ×4 IMPLANT
SCREW PEDICLE 6.5X40MM (Screw) ×4 IMPLANT
SCREW PEDICLE 6.5X45 (Screw) ×2 IMPLANT
SCREW PEDICLE REVERE 5.5X45 (Screw) ×4 IMPLANT
SPONGE GAUZE 4X4 12PLY (GAUZE/BANDAGES/DRESSINGS) ×2 IMPLANT
SPONGE LAP 4X18 X RAY DECT (DISPOSABLE) IMPLANT
SPONGE SURGIFOAM ABS GEL 100 (HEMOSTASIS) ×4 IMPLANT
STRIP CLOSURE SKIN 1/2X4 (GAUZE/BANDAGES/DRESSINGS) ×2 IMPLANT
SUT PROLENE 6 0 BV (SUTURE) ×2 IMPLANT
SUT VIC AB 0 CT1 18XCR BRD8 (SUTURE) ×1 IMPLANT
SUT VIC AB 0 CT1 8-18 (SUTURE) ×1
SUT VIC AB 2-0 CT1 18 (SUTURE) ×2 IMPLANT
SUT VICRYL 4-0 PS2 18IN ABS (SUTURE) ×2 IMPLANT
SYR 20ML ECCENTRIC (SYRINGE) ×2 IMPLANT
TOWEL OR 17X24 6PK STRL BLUE (TOWEL DISPOSABLE) ×2 IMPLANT
TOWEL OR 17X26 10 PK STRL BLUE (TOWEL DISPOSABLE) ×2 IMPLANT
TRAY FOLEY CATH 14FRSI W/METER (CATHETERS) ×2 IMPLANT
WATER STERILE IRR 1000ML POUR (IV SOLUTION) ×2 IMPLANT
caliber 9-13 26 mm ×8 IMPLANT

## 2012-06-20 NOTE — Anesthesia Procedure Notes (Signed)
Procedure Name: Intubation Date/Time: 06/20/2012 11:34 AM Performed by: Gayla Medicus Pre-anesthesia Checklist: Patient identified, Emergency Drugs available, Suction available, Patient being monitored and Timeout performed Patient Re-evaluated:Patient Re-evaluated prior to inductionOxygen Delivery Method: Circle system utilized Preoxygenation: Pre-oxygenation with 100% oxygen Intubation Type: IV induction Ventilation: Two handed mask ventilation required, Mask ventilation with difficulty and Oral airway inserted - appropriate to patient size Laryngoscope Size: Mac and 4 Grade View: Grade I Tube type: Oral Tube size: 7.5 mm Number of attempts: 1 Airway Equipment and Method: Stylet and Video-laryngoscopy Placement Confirmation: ETT inserted through vocal cords under direct vision,  positive ETCO2 and breath sounds checked- equal and bilateral Secured at: 23 cm Tube secured with: Tape Dental Injury: Teeth and Oropharynx as per pre-operative assessment  Difficulty Due To: Difficulty was anticipated Future Recommendations: Recommend- induction with short-acting agent, and alternative techniques readily available

## 2012-06-20 NOTE — Preoperative (Signed)
Beta Blockers   Reason not to administer Beta Blockers:Not Applicable 

## 2012-06-20 NOTE — Consult Note (Signed)
PULMONARY  / CRITICAL CARE MEDICINE Consult Note   Name: Phillip Chandler MRN: [id number] DOB: 25-Jul-1930    LOS: 1  REFERRING MD :  Mariam Dollar, MD ( Neurosurgery)  CHIEF COMPLAINT:  Medical management  BRIEF PATIENT DESCRIPTION: 79 yo with DM, HTN, CHF , CVA , GERD, Anxiety, Arthritis with severe lumbar spinal stenosis chair disease and degenerative spondylolisthesis at L4-5 as well as L5-S1 s/p  stabilization procedure at these 2 levels.  PCCM consulted for medical management .   LINES / TUBES:  CULTURES:  ANTIBIOTICS: Ancef 1/20 x 1  SIGNIFICANT EVENTS:  1/20 Stabilization L4-L5 and L5-S1 procedure   LEVEL OF CARE:  ICU  PRIMARY SERVICE:  Neurosurgery  CONSULTANTS:  PCCM  DIET:  As tolerated  DVT Px:  SCD  GI Px:  Protonix   HISTORY OF PRESENT ILLNESS:   Patient is a 79 y.o. Male  With PMH significant for DM, HTN, CHF , CVA , GERD, Anxiety, Arthritis with severe lumbar spinal stenosis chair disease and degenerative spondylolisthesis at L4-5 as well as L5-S1  who complains of low back and right leg pain. Onset of symptoms was several years ago, gradually worsening since that time.  Marland Kitchen   PAST MEDICAL HISTORY :  Past Medical History  Diagnosis Date  . Complication of anesthesia   . PONV (postoperative nausea and vomiting)   . Hypertension   . Dysrhythmia     afib hx of  . CHF (congestive heart failure)   . Pneumonia     10/2011  . Anxiety   . Diverticula of colon   . Stroke 2005  . GERD (gastroesophageal reflux disease)   . Cancer     basal and squamous cell face  . Arthritis   . Diabetes mellitus without complication    Past Surgical History  Procedure Date  . Joint replacement     Bil Knee  . Hernia repair     Umbicilal-2011  . Eye surgery     Cartaract bil  . Shoulder fusion   . Back surgery 1991   Prior to Admission medications   Medication Sig Start Date End Date Taking? Authorizing Provider  aliskiren (TEKTURNA) 300 MG tablet Take 300 mg by  mouth daily.   Yes Historical Provider, MD  amiodarone (PACERONE) 200 MG tablet Take 200 mg by mouth daily.   Yes Historical Provider, MD  cetirizine (ZYRTEC) 10 MG tablet Take 10 mg by mouth daily.   Yes Historical Provider, MD  cloNIDine (CATAPRES) 0.1 MG tablet Take 0.1 mg by mouth 2 (two) times daily.   Yes Historical Provider, MD  fluticasone (FLONASE) 50 MCG/ACT nasal spray Place 2 sprays into the nose daily.   Yes Historical Provider, MD  furosemide (LASIX) 20 MG tablet Take 20 mg by mouth 2 (two) times daily.   Yes Historical Provider, MD  HYDROcodone-acetaminophen (NORCO) 7.5-325 MG per tablet Take 1 tablet by mouth every 4 (four) hours as needed. For pain   Yes Historical Provider, MD  labetalol (NORMODYNE) 200 MG tablet Take 200 mg by mouth 2 (two) times daily.   Yes Historical Provider, MD  mesalamine (LIALDA) 1.2 G EC tablet Take 1,200 mg by mouth daily with breakfast.   Yes Historical Provider, MD  metFORMIN (GLUMETZA) 500 MG (MOD) 24 hr tablet Take 500 mg by mouth daily with breakfast.   Yes Historical Provider, MD  omeprazole (PRILOSEC) 40 MG capsule Take 40 mg by mouth daily.   Yes Historical Provider, MD  pregabalin (LYRICA) 100 MG capsule Take 100 mg by mouth 4 (four) times daily.   Yes Historical Provider, MD  simvastatin (ZOCOR) 40 MG tablet Take 40 mg by mouth every evening.   Yes Historical Provider, MD   Allergies  Allergen Reactions  . Fentanyl Other (See Comments)    Personality change- cried a lot.  . Versed (Midazolam) Other (See Comments)    Can in personality.  . Altace (Ramipril)     Shortness of breath  . Sulfa Antibiotics     hives   FAMILY HISTORY:  History reviewed. No pertinent family history.  SOCIAL HISTORY:  reports that he has quit smoking. He does not have any smokeless tobacco history on file. He reports that he does not drink alcohol or use illicit drugs.  REVIEW OF SYSTEMS:  As per HPI   INTERVAL HISTORY:   VITAL SIGNS: Temp:  [97.8 F  (36.6 C)-98.7 F (37.1 C)] 98.4 F (36.9 C) (01/20 1930) Pulse Rate:  [54-72] 54  (01/20 2300) Resp:  [11-20] 13  (01/20 2300) BP: (152-204)/(59-101) 163/62 mmHg (01/20 2300) SpO2:  [92 %-97 %] 95 % (01/20 2300) Arterial Line BP: (167-234)/(65-90) 186/65 mmHg (01/20 2300) Weight:  [284 lb 6.3 oz (129 kg)] 284 lb 6.3 oz (129 kg) (01/20 2000)    INTAKE / OUTPUT: Intake/Output      01/20 0701 - 01/21 0700   I.V. (mL/kg) 2165 (16.8)   Blood 724   IV Piggyback 802   Total Intake(mL/kg) 3691 (28.6)   Urine (mL/kg/hr) 430 (0.1)   Drains 275   Blood 450   Total Output 1155   Net +2536         PHYSICAL EXAMINATION: General:  In mild discomfort Neuro:  Alert and Oriented, HEENT:  PERRl, EOMI  Cardiovascular:  Bradycardia , Irregular rate  Lungs:  Good air movement, no wheezing  Abdomen:  Soft, distended, non-tender,  Musculoskeletal:  Moving all four extremities , 2 + pitting edema present  Skin:  Ecchymosis present on upper extremities   LABS:  Lab 06/20/12 1820 06/20/12 1104  HGB 10.2* --  WBC 11.2* --  PLT 203 --  NA -- --  K -- --  CL -- --  CO2 -- --  GLUCOSE -- --  BUN -- --  CREATININE -- --  CALCIUM -- --  MG -- --  PHOS -- --  AST -- --  ALT -- --  ALKPHOS -- --  BILITOT -- --  PROT -- --  ALBUMIN -- --  INR -- 0.98  APTT -- --  INR -- 0.98  LATICACIDVEN -- --  TROPONINI -- --  PHART -- --  PCO2ART -- --  PO2ART -- --  HCO3 -- --  O2SAT -- --   IMAGING: 1/20  PCXR >>> Malpositioned catheter projecting at the right lower neck/upper chest. Probably an IJ approach line which has coursed into a  superficial vein. Recommend removal and repositioning. Increased vascular congestion and retrocardiac atelectasis.  ECG:  DIAGNOSES: Active Problems:  Degenerative spondylolisthesis  Diabetes mellitus  HTN (hypertension)  A-fib  CHF (congestive heart failure)  Anxiety  GERD (gastroesophageal reflux disease)  ASSESSMENT / PLAN:  PULMONARY A:  No active issues. P:   Goal SpO2>92 Supplemental oxygen  CARDIOVASCULAR A: A-fib without RVR. Bradycardia.  Hx of CHF, no evidence of exacerbation. P:  Continue Aliskiren , Amiodaore, Clonidine, Lasix , Labetolol   RENAL A: No active issues. P:   BMET  GASTROINTESTINAL A:  UC/Crohns disease? P:   Continue Mesalamine   HEMATOLOGIC A:  Anemia - unclear baseline  P:  Trend CBC  ENDOCRINE A:  DM P:   D/c Metformin SSI   NEUROLOGIC A:  S/p Redo decompressive lumbar laminectomy L4-5 in excess will be needed with a standard interbody fusion. P:   Per Neurosurgery   Lonia Farber, MD Pulmonary and Critical Care Medicine [facility] Pager: [phone]

## 2012-06-20 NOTE — Anesthesia Preprocedure Evaluation (Addendum)
Anesthesia Evaluation  Patient identified by MRN, date of birth, ID band Patient awake    Reviewed: Allergy & Precautions, H&P , NPO status , Patient's Chart, lab work & pertinent test results  History of Anesthesia Complications (+) PONV  Airway Mallampati: II      Dental  (+) Edentulous Upper and Edentulous Lower   Pulmonary pneumonia -, resolved,  breath sounds clear to auscultation        Cardiovascular hypertension, +CHF + dysrhythmias Rhythm:Regular Rate:Normal     Neuro/Psych    GI/Hepatic   Endo/Other  diabetes  Renal/GU      Musculoskeletal   Abdominal (+) + obese,  Abdomen: soft.    Peds  Hematology   Anesthesia Other Findings   Reproductive/Obstetrics                         Anesthesia Physical Anesthesia Plan  ASA: III  Anesthesia Plan: General   Post-op Pain Management:    Induction: Intravenous  Airway Management Planned: Oral ETT  Additional Equipment: Arterial line and CVP  Intra-op Plan:   Post-operative Plan: Extubation in OR  Informed Consent: I have reviewed the patients History and Physical, chart, labs and discussed the procedure including the risks, benefits and alternatives for the proposed anesthesia with the patient or authorized representative who has indicated his/her understanding and acceptance.     Plan Discussed with: CRNA and Surgeon  Anesthesia Plan Comments: (Type 2 DM  Htn H/O Afib now in SR Low risk nuclear stress test done on 05/23/12 Obesity Chronic venous stasis H/O Post-op N/V)        Anesthesia Quick Evaluation

## 2012-06-20 NOTE — Op Note (Signed)
Preoperative diagnosis: Grade 1 spondylolisthesis L4-5 with severe lumbar spinal stenosis L4-5 L5-S1 with severe foraminal stenosis of the L4, L5, and S1 nerve root. Mechanical low back pain lumbar radiculopathy and neurogenic claudication  Postoperative diagnosis: Same  Procedure: Redo decompressive lumbar laminectomy L4-5 in excess will be needed with a standard interbody fusion  #2 decompressive lumbar laminectomy L5-S1 and excess will be needed with a standard interbody fusion  #3 posterior lumbar interbody fusion L4-5 and L5-S1 using expandable peek cages packed with local autograft mixed with DBX  #4 pedicle screw fixation using the 5.5 Revere globus pedicle to system L4-S1  #5 posterior lateral arthrodesis L4-S1 using local autograft mixed with DBX and BMP  #6 open reduction spinal deformity  #7 placement of a large Hemovac drain  Surgeon: Jillyn Hidden Francy Mcilvaine  Assistant: Shirlean Kelly  Anesthesia: Gen.  EBL: 800  History of present illness: Patient is a very pleasant 79 year old on who's had long-standing history with his low back he actually underwent previous laminectomy for excision of some sort spinal tumor many many years ago however the patient had progressive worsening back and right greater left leg pain rating to his hip down the backs of his legs his tops of his feet and big toe. He had a baseline footdrop on the left from his previous surgery and he's had progressive weakness has developed and the right leg. Workup has revealed severe stenosis and spondylolisthesis and bi-foraminal stenosis of the L4-L5 and S1 nerve roots. Due to patient's failed conservative treatment imaging findings progression of clinical syndrome I recommended a decompression stabilization procedure at L4-5 and L5-S1 x-rays were reviewed the risks benefits of the operation with the patient as well as perioperative course and expectations of outcome alternatives of surgery he understood and agreed to proceed  forward.  Operative procedure: Patient was induced under general anesthesia positioned prone on the Wilson frame his back was prepped and draped in routine sterile fashion. His old incision was opened up and the scar tissues dissected free and subperiosteal dissections care lamina of L4-L5 and S1 bilaterally exposing the TPS L4 L5-S1 bilaterally once identify the appropriate level with fluoroscopy the spinous process of L4 was removed and immediately this unroofed a pocket of 5 clear fluid felt to be possibly ran assuming sealer synovial cyst on the right side on the opposite side middle surgery. So working underneath the lamina of L5 facets decompression was begun marking up to the scarred an area at L4-5 there was marked facet arthropathy with severe diastases of the facet joints there is marked overgrowth of all the facet joints and severe compression of thecal sac at L. at L4-5 facet complex level an extensive amount of inflammatory tissue even on the side had not been operated on L4-5 so this is all teased off of the dura a very large osteophyte coming off the L4-5 facet was digging into the thecal sac at this level was teased off of the dura with a dental dissector and removed the suture rongeurs R. Christian Patent examiner. While I was doing this I did see some clear fluid however after a complete the dissection had room and had done radical foraminotomies of the L4-L5 and S1 nerve roots exploring this area the L4-5 disc space I did not appreciate any dural tear and there is no longer any leaking out after being packed with short-arm Gelfoam. So I started decompressed on the right side and excision of scar tissues dissected off of the old laminotomy defect the  L4-L5 and S1 nerve was identified complete medial facetectomies were performed on that side as well I'll the scar tissues dissected off the disc space to identify the disc space at L4-5 and L5 nerve root was skeletonized flush with pedicle as was  aggressive abutting both superior tickling process is a ladder axis the lateral margins disc S. bilaterally. After all decompression been done attention second pedicle screw placement using a high-speed drill pilot holes were drilled K. of the PROBE with a 552 Christian probe the probe O55 tap however at L4 pedicle which was small and they elected to use 5 5 x 45 screws with 6 5 x 45 at L5 and 6 5 x 40 at S1 are all 6 screws and placed all screws excellent purchase fluoroscopy was used the step along the way as well as internal and external bony landmarks to confirm no medial or lateral breech. Then testing the interbody work first working at L5-S1 the right-sided spaces cleanout a distractor was inserted sequentially up to a 10 and a 9 mm cage was selected the spaces cleanout I. endplates were prepared with Epstein curettes rotating cutters disc space cleanout pituitary rongeurs and a 9 cage was placed and expanded proximal 4 returns to 12 mm this had good apposition the endplates and opened up the disc space significantly then testing at L4-5 had a candidate lateral to the dura again some additional clear fluid came out of bed annulotomy was again and goes with the back of her edematous fluid as opposed CSF this disc was packed in and workup the spinal listhesis level of L4-5 disc spaces cleanout pituitary rongeurs Epstein curettes and rotating cutters then after adequate endplate preparation been achieved and 9 the medications inserted here to it also expanded up to approximately 12 mm this significantly reduced the deformity L4-5. Then the distractor was removed with the opposite side in a similar fashion both opposite sides were prepared local are graft was packed centrally BMP was packed anteriorly and after local autograft packed centrally the other ossification were inserted. After only about work been done postop fluoroscopy confirmed significant reduction of her scoliotic deformity as well as a  spondylolisthesis and good placement of all screws and implants. There was a copiously irrigated meticulous in space was maintained aggressive decortication was care MTPs or lateral gutters 75 mm rods were placed top tightness tightened at S1 the L5 and S4-L4 screws were also tightened down. Across it was applied ulnar foraminal reexplored with a coronary.her to confirm patency no migration of graft material Gelfoam was overlaid top of the dura large abductor was placed the wounds closed in layers with after Vicryl and the skin was closed with a running 4 subcuticular benzoin incisions were applied patient recovered in stable condition. At the end of case on it counts and sponge counts were correct.

## 2012-06-20 NOTE — Transfer of Care (Signed)
Immediate Anesthesia Transfer of Care Note  Patient: Phillip Chandler  Procedure(s) Performed: Procedure(s) (LRB) with comments: POSTERIOR LUMBAR FUSION 2 LEVEL (N/A) - decompression and fusion lumbar four-five, lumbar four-sacral one  Patient Location: PACU  Anesthesia Type:General  Level of Consciousness: awake, alert  and oriented  Airway & Oxygen Therapy: Patient Spontanous Breathing and Patient connected to face mask oxygen  Post-op Assessment: Report given to PACU RN, Post -op Vital signs reviewed and stable and Patient moving all extremities X 4  Post vital signs: Reviewed and stable  Complications: No apparent anesthesia complications

## 2012-06-20 NOTE — Progress Notes (Signed)
Received order to d/c central line from Dr. Randa Evens. IV team made aware.

## 2012-06-20 NOTE — H&P (Addendum)
Subjective: Patient is a 79 y.o. male who complains of low back and right leg pain. Onset of symptoms was several years ago, gradually worsening since that time.  Onset was not related to a remote injury. The pain is rated severe, and is located at the right gluteal area. The pain is described as sharp and occurs intermittently. The symptoms has been progressive. Symptoms are exacerbated by nothing in particular. The patient has tried exercise therapy as well as epidural steroid injections with no significant relief. CT and subsequent CT myelography have revealed severe stenosis and degenerative disc disease at L4-5 L5-S1 with a grade 1 spondylolisthesis at L4-5 causing severe foraminal stenosis of the L4 and L5 nerve roots. It also shows laminotomy defect in the left where he had his previous surgery performed by Dr. Roxan Hockey. Due to patient's failure conservative treatment progression of clinical syndrome and imaging findings we have recommended a reexploration and redo this decompression L4-5 posterior lumbar interbody fusion L4-5 L5-S1 I extensively reviewed the risks and benefits of the operation as well as perioperative course and expectations of outcome alternatives surgery and he understood and agreed to proceed forward.   Past Medical History  Diagnosis Date  . Complication of anesthesia   . PONV (postoperative nausea and vomiting)   . Hypertension   . Dysrhythmia     afib hx of  . CHF (congestive heart failure)   . Pneumonia     10/2011  . Anxiety   . Diverticula of colon   . Stroke 2005  . GERD (gastroesophageal reflux disease)   . Cancer     basal and squamous cell face  . Arthritis   . Diabetes mellitus without complication     Past Surgical History  Procedure Date  . Joint replacement     Bil Knee  . Hernia repair     Umbicilal-2011  . Eye surgery     Cartaract bil  . Shoulder fusion   . Back surgery 1991    Allergies  Allergen Reactions  . Fentanyl Other (See  Comments)    Personality change- cried a lot.  . Versed (Midazolam) Other (See Comments)    Can in personality.  . Altace (Ramipril)     Shortness of breath  . Sulfa Antibiotics     hives    History  Substance Use Topics  . Smoking status: Former Smoker -- 8 years  . Smokeless tobacco: Not on file     Comment: quit in the 50's  . Alcohol Use: No    History reviewed. No pertinent family history. Prior to Admission medications   Medication Sig Start Date End Date Taking? Authorizing Provider  aliskiren (TEKTURNA) 300 MG tablet Take 300 mg by mouth daily.   Yes Historical Provider, MD  amiodarone (PACERONE) 200 MG tablet Take 200 mg by mouth daily.   Yes Historical Provider, MD  cetirizine (ZYRTEC) 10 MG tablet Take 10 mg by mouth daily.   Yes Historical Provider, MD  cloNIDine (CATAPRES) 0.1 MG tablet Take 0.1 mg by mouth 2 (two) times daily.   Yes Historical Provider, MD  fluticasone (FLONASE) 50 MCG/ACT nasal spray Place 2 sprays into the nose daily.   Yes Historical Provider, MD  furosemide (LASIX) 20 MG tablet Take 20 mg by mouth 2 (two) times daily.   Yes Historical Provider, MD  HYDROcodone-acetaminophen (NORCO) 7.5-325 MG per tablet Take 1 tablet by mouth every 4 (four) hours as needed. For pain   Yes Historical Provider, MD  labetalol (NORMODYNE) 200 MG tablet Take 200 mg by mouth 2 (two) times daily.   Yes Historical Provider, MD  mesalamine (LIALDA) 1.2 G EC tablet Take 1,200 mg by mouth daily with breakfast.   Yes Historical Provider, MD  metFORMIN (GLUMETZA) 500 MG (MOD) 24 hr tablet Take 500 mg by mouth daily with breakfast.   Yes Historical Provider, MD  omeprazole (PRILOSEC) 40 MG capsule Take 40 mg by mouth daily.   Yes Historical Provider, MD  pregabalin (LYRICA) 100 MG capsule Take 100 mg by mouth 4 (four) times daily.   Yes Historical Provider, MD  simvastatin (ZOCOR) 40 MG tablet Take 40 mg by mouth every evening.   Yes Historical Provider, MD     Review of  Systems  Positive ROS: Review of systems is remarkable for vision difficulty wears glasses hearing loss nasal congestion sinus sinusitis hypertension hypercholesterolemia asthma weakness in his arms and legs back pain allergies and diabetes  All other systems have been reviewed and were otherwise negative with the exception of those mentioned in the HPI and as above.  Objective: Vital signs in last 24 hours: Temp:  [97.8 F (36.6 C)] 97.8 F (36.6 C) (01/20 0919) Pulse Rate:  [59] 59  (01/20 0919) Resp:  [18] 18  (01/20 0919) BP: (174)/(101) 174/101 mmHg (01/20 0924) SpO2:  [97 %] 97 % (01/20 0919)  General Appearance: Alert, cooperative, no distress, appears stated age Head: Normocephalic, without obvious abnormality, atraumatic Eyes: PERRL, conjunctiva/corneas clear, EOM's intact, fundi benign, both eyes      Ears: Normal TM's and external ear canals, both ears Throat: Lips, mucosa, and tongue normal; teeth and gums normal Neck: Supple, symmetrical, trachea midline, no adenopathy; thyroid: No enlargement/tenderness/nodules; no carotid bruit or JVD Back: Symmetric, no curvature, ROM normal, no CVA tenderness Lungs: Clear to auscultation bilaterally, respirations unlabored Heart: Regular rate and rhythm, S1 and S2 normal, no murmur, rub or gallop Abdomen: Soft, non-tender, bowel sounds active all four quadrants, no masses, no organomegaly Extremities: Extremities normal, multiple areas of spontaneous ecchymoses and bruising significant lower extremity edema Pulses: 2+ and symmetric all extremities Skin: Skin color, texture, turgor normal, no rashes or lesions  NEUROLOGIC:  Motor Exam - upper extremity. strength is 5 out of 5 right lower extremity strength is 4+ out of 5 in his right quad 4/5 weakness in dorsiflexion of his right foot and EHL is also 4+ out of  5 the left side as a baseline footdrop from his original surgery many many years ago. Sensory Exam - grossly  normal Coordination - grossly normal Gait - grossly normal Balance - grossly normal Cranial Nerves: I: smell Not tested  II: visual acuity  OS:     OD:   II: visual fields Full to confrontation  II: pupils Equal, round, reactive to light  III,VII: ptosis None  III,IV,VI: extraocular muscles  Full ROM  V: mastication Normal  V: facial light touch sensation  Normal  V,VII: corneal reflex  Present  VII: facial muscle function - upper  Normal  VII: facial muscle function - lower Normal  VIII: hearing Not tested  IX: soft palate elevation  Normal  IX,X: gag reflex Present  XI: trapezius strength  5/5  XI: sternocleidomastoid strength 5/5  XI: neck flexion strength  5/5  XII: tongue strength  Normal    Data Review Lab Results  Component Value Date   WBC 7.9 06/10/2012   HGB 11.0* 06/10/2012   HCT 34.4* 06/10/2012   MCV 90.1  06/10/2012   PLT 219 06/10/2012   Lab Results  Component Value Date   NA 141 06/10/2012   K 3.6 06/10/2012   CL 99 06/10/2012   CO2 32 06/10/2012   BUN 14 06/10/2012   CREATININE 0.92 06/10/2012   GLUCOSE 122* 06/10/2012   No results found for this basename: INR, PROTIME    Assessment/Plan: 79 year old gentleman presents with severe lumbar spinal stenosis chair disease and degenerative spondylolisthesis at L4-5 as well as L5-S1 he presents for decompression stabilization procedure at these 2 levels.   Jini Horiuchi P 06/20/2012 9:55 AM

## 2012-06-20 NOTE — Anesthesia Postprocedure Evaluation (Signed)
  Anesthesia Post-op Note  Patient: Phillip Chandler  Procedure(s) Performed: Procedure(s) (LRB) with comments: POSTERIOR LUMBAR FUSION 2 LEVEL (N/A) - decompression and fusion lumbar four-five, lumbar four-sacral one  Patient Location: PACU  Anesthesia Type:General  Level of Consciousness: awake  Airway and Oxygen Therapy: Patient Spontanous Breathing  Post-op Pain: mild  Post-op Assessment: Post-op Vital signs reviewed  Post-op Vital Signs: Reviewed  Complications: No apparent anesthesia complications

## 2012-06-21 DIAGNOSIS — I5031 Acute diastolic (congestive) heart failure: Secondary | ICD-10-CM | POA: Diagnosis not present

## 2012-06-21 DIAGNOSIS — K219 Gastro-esophageal reflux disease without esophagitis: Secondary | ICD-10-CM | POA: Diagnosis present

## 2012-06-21 DIAGNOSIS — I639 Cerebral infarction, unspecified: Secondary | ICD-10-CM | POA: Insufficient documentation

## 2012-06-21 DIAGNOSIS — F419 Anxiety disorder, unspecified: Secondary | ICD-10-CM | POA: Diagnosis present

## 2012-06-21 DIAGNOSIS — M4310 Spondylolisthesis, site unspecified: Secondary | ICD-10-CM

## 2012-06-21 LAB — BASIC METABOLIC PANEL
BUN: 16 mg/dL (ref 6–23)
BUN: 21 mg/dL (ref 6–23)
BUN: 20 mg/dL (ref 6–23)
CO2: 28 mEq/L (ref 19–32)
CO2: 23 mEq/L (ref 19–32)
CO2: 27 mEq/L (ref 19–32)
Calcium: 8.4 mg/dL (ref 8.4–10.5)
Calcium: 8.4 mg/dL (ref 8.4–10.5)
Calcium: 8.5 mg/dL (ref 8.4–10.5)
Chloride: 104 mEq/L (ref 96–112)
Chloride: 102 mEq/L (ref 96–112)
Chloride: 104 mEq/L (ref 96–112)
Creatinine, Ser: 0.92 mg/dL (ref 0.50–1.35)
Creatinine, Ser: 1.32 mg/dL (ref 0.50–1.35)
Creatinine, Ser: 1.29 mg/dL (ref 0.50–1.35)
GFR calc Af Amer: >90 mL/min (ref >90)
GFR calc Af Amer: 58 mL/min — ABNORMAL LOW (ref >90)
GFR calc Af Amer: 59 mL/min — ABNORMAL LOW (ref >90)
GFR calc non Af Amer: 78 mL/min — ABNORMAL LOW (ref >90)
GFR calc non Af Amer: 50 mL/min — ABNORMAL LOW (ref >90)
GFR calc non Af Amer: 51 mL/min — ABNORMAL LOW (ref >90)
Glucose, Bld: 126 mg/dL — ABNORMAL HIGH (ref 70–99)
Glucose, Bld: 118 mg/dL — ABNORMAL HIGH (ref 70–99)
Glucose, Bld: 130 mg/dL — ABNORMAL HIGH (ref 70–99)
Potassium: 3.6 mEq/L (ref 3.5–5.1)
Potassium: 4.3 mEq/L (ref 3.5–5.1)
Potassium: 4.1 mEq/L (ref 3.5–5.1)
Sodium: 141 mEq/L (ref 135–145)
Sodium: 139 mEq/L (ref 135–145)
Sodium: 141 mEq/L (ref 135–145)

## 2012-06-21 LAB — TYPE AND SCREEN
ABO/RH(D): O NEG
Antibody Screen: NEGATIVE
Unit division: 0
Unit division: 0

## 2012-06-21 LAB — CREATININE, URINE, RANDOM: Creatinine, Urine: 231.41 mg/dL

## 2012-06-21 LAB — CBC
HCT: 30.9 % — ABNORMAL LOW (ref 39.0–52.0)
Hemoglobin: 10.1 g/dL — ABNORMAL LOW (ref 13.0–17.0)
MCH: 29.1 pg (ref 26.0–34.0)
MCHC: 32.7 g/dL (ref 30.0–36.0)
MCV: 89 fL (ref 78.0–100.0)
Platelets: 203 10*3/uL (ref 150–400)
RBC: 3.47 MIL/uL — ABNORMAL LOW (ref 4.22–5.81)
RDW: 14.9 % (ref 11.5–15.5)
WBC: 10.3 10*3/uL (ref 4.0–10.5)

## 2012-06-21 LAB — GLUCOSE, CAPILLARY
Glucose-Capillary: 124 mg/dL — ABNORMAL HIGH (ref 70–99)
Glucose-Capillary: 140 mg/dL — ABNORMAL HIGH (ref 70–99)
Glucose-Capillary: 121 mg/dL — ABNORMAL HIGH (ref 70–99)
Glucose-Capillary: 149 mg/dL — ABNORMAL HIGH (ref 70–99)

## 2012-06-21 LAB — SODIUM, URINE, RANDOM: Sodium, Ur: <10 mEq/L

## 2012-06-21 MED ORDER — SODIUM CHLORIDE 0.9 % IV SOLN
INTRAVENOUS | Status: DC
  Administered 2012-06-21 – 2012-06-27 (×3): via INTRAVENOUS

## 2012-06-21 MED ORDER — CEFAZOLIN SODIUM-DEXTROSE 2-3 GM-% IV SOLR
2.0000 g | Freq: Three times a day (TID) | INTRAVENOUS | Status: AC
  Administered 2012-06-21 (×2): 2 g via INTRAVENOUS
  Filled 2012-06-21 (×2): qty 50

## 2012-06-21 MED ORDER — SODIUM CHLORIDE 0.9 % IV BOLUS (SEPSIS)
250.0000 mL | Freq: Once | INTRAVENOUS | Status: AC
  Administered 2012-06-21: 250 mL via INTRAVENOUS

## 2012-06-21 MED ORDER — SODIUM CHLORIDE 0.9 % IV BOLUS (SEPSIS)
500.0000 mL | Freq: Once | INTRAVENOUS | Status: AC
  Administered 2012-06-21: 500 mL via INTRAVENOUS

## 2012-06-21 NOTE — Progress Notes (Signed)
Subjective: Patient reports Please feeling okay and legs feel better no new leg pain no new numbness tingling he has a lot of pain in his back that is managed the medication.  Objective: Vital signs in last 24 hours: Temp:  [98.4 F (36.9 C)-99.1 F (37.3 C)] 99.1 F (37.3 C) (01/21 0757) Pulse Rate:  [54-79] 78  (01/21 1000) Resp:  [11-20] 15  (01/21 1000) BP: (125-204)/(59-83) 125/66 mmHg (01/21 1000) SpO2:  [92 %-98 %] 94 % (01/21 1000) Arterial Line BP: (140-234)/(58-90) 140/60 mmHg (01/21 1000) Weight:  [129 kg (284 lb 6.3 oz)] 129 kg (284 lb 6.3 oz) (01/20 2000)  Intake/Output from previous day: 01/20 0701 - 01/21 0700 In: 4166 [I.V.:2590; Blood:724; IV Piggyback:852] Out: 1930 [Urine:805; Drains:675; Blood:450] Intake/Output this shift: Total I/O In: 150 [I.V.:150] Out: 405 [Urine:145; Drains:260]  Baseline strength right looks to me at 4-4+ out of 5 and baseline left foot drop  Lab Results:  Basename 06/21/12 0500 06/20/12 1820  WBC 10.3 11.2*  HGB 10.1* 10.2*  HCT 30.9* 30.7*  PLT 203 203   BMET  Basename 06/21/12 0500  NA 141  K 3.6  CL 104  CO2 28  GLUCOSE 126*  BUN 16  CREATININE 0.92  CALCIUM 8.4    Studies/Results: Dg Chest 2 View  06/20/2012  *RADIOLOGY REPORT*  Clinical Data: Preop lumbar surgery.  Hypertension, diabetes.  CHEST - 2 VIEW  Comparison: 04/15/2012  Findings: The heart is borderline in size.  Mild vascular congestion.  Mild biapical pleural thickening, stable.  Stable prominence of the markings in the right perihilar region.  Lingular scarring, stable.  No effusions or acute infiltrates.  No acute bony abnormality.  IMPRESSION: Stable chronic changes.  No acute findings.   Original Report Authenticated By: Charlett Nose, M.D.    Dg Lumbar Spine 2-3 Views  06/20/2012  *RADIOLOGY REPORT*  Clinical Data: L4-S1 PLIF  DG C-ARM 61-120 MIN, LUMBAR SPINE - 2-3 VIEW  Technique: Two fluoroscopic intraoperative spot views of the lower lumbar spine  are provided.  Comparison:  None.  Findings: Provided images demonstrate interbody spacers and pedicle screws from L4-S1.  IMPRESSION: L4-S1 PLIF in progress.   Original Report Authenticated By: Holley Dexter, M.D.    Dg Chest Port 1 View  06/20/2012  *RADIOLOGY REPORT*  Clinical Data: 79 year old male line placement.  PORTABLE CHEST - 1 VIEW  Comparison: 0859 hours the same day and earlier.  Findings: Portable semi upright AP view 1847 hours.  Curve plastic catheter projects over the right base of the neck and clavicle.  No other central line identified.  Stable cardiomegaly and mediastinal contours.  No pneumothorax. Mildly increased retrocardiac opacity.  Increased vascular congestion, no definite overt pulmonary edema.  IMPRESSION: 1.  Malpositioned catheter projecting at the right lower neck/upper chest.  Probably an IJ approach line which has coursed into a superficial vein.  Recommend removal and repositioning. 2.  Increased vascular congestion and retrocardiac atelectasis.  #1 discussed with Dr. Judie Petit by telephone on 06/20/12 at 1901 hours.   Original Report Authenticated By: Erskine Speed, M.D.    Dg C-arm [id number] Min  06/20/2012  *RADIOLOGY REPORT*  Clinical Data: L4-S1 PLIF  DG C-ARM 61-120 MIN, LUMBAR SPINE - 2-3 VIEW  Technique: Two fluoroscopic intraoperative spot views of the lower lumbar spine are provided.  Comparison:  None.  Findings: Provided images demonstrate interbody spacers and pedicle screws from L4-S1.  IMPRESSION: L4-S1 PLIF in progress.   Original Report Authenticated By: Maisie Fus  Maricela Curet, M.D.     Assessment/Plan: Continue mobilizes physical therapy I did take out his Hemovac secondary to the mix of blood and CSF drainage. We'll continue to mobilize with physical therapy appreciate critical-care assisting in his management.  LOS: 1 day     Edina Winningham P 06/21/2012, 10:32 AM

## 2012-06-21 NOTE — Progress Notes (Signed)
PULMONARY  / CRITICAL CARE MEDICINE Consult Note   Name: Phillip Chandler MRN: [id number] DOB: 07-31-1930    LOS: 1  REFERRING MD :  Mariam Dollar, MD ( Neurosurgery)  CHIEF COMPLAINT:  Medical management  BRIEF PATIENT DESCRIPTION: 79 yo with DM, HTN, CHF , CVA , GERD, Anxiety, Arthritis with severe lumbar spinal stenosis chair disease and degenerative spondylolisthesis at L4-5 as well as L5-S1 s/p  stabilization procedure at these 2 levels.  PCCM consulted for medical management .   LINES / TUBES: PIV  CULTURES: None  ANTIBIOTICS: Ancef 1/20 x 1  SIGNIFICANT EVENTS:  1/20 Stabilization L4-L5 and L5-S1 procedure   LEVEL OF CARE:  ICU  PRIMARY SERVICE:  Neurosurgery  CONSULTANTS:  PCCM  DIET:  As tolerated  DVT Px:  SCD  GI Px:  Protonix    INTERVAL HISTORY:  Doing well .  No new issues.  VITAL SIGNS: Temp:  [97.8 F (36.6 C)-99.1 F (37.3 C)] 99.1 F (37.3 C) (01/21 0757) Pulse Rate:  [54-79] 79  (01/21 0800) Resp:  [11-20] 16  (01/21 0800) BP: (150-204)/(59-101) 157/76 mmHg (01/21 0800) SpO2:  [92 %-98 %] 94 % (01/21 0800) Arterial Line BP: (167-234)/(61-90) 191/74 mmHg (01/21 0800) Weight:  [129 kg (284 lb 6.3 oz)] 129 kg (284 lb 6.3 oz) (01/20 2000)    INTAKE / OUTPUT: Intake/Output      01/20 0701 - 01/21 0700 01/21 0701 - 01/22 0700   I.V. (mL/kg) 2590 (20.1) 50 (0.4)   Blood 724    IV Piggyback 852    Total Intake(mL/kg) 4166 (32.3) 50 (0.4)   Urine (mL/kg/hr) 805 (0.3) 100   Drains 675 140   Blood 450    Total Output 1930 240   Net +2236 -190          PHYSICAL EXAMINATION: General:  In mild discomfort Neuro:  Alert and Oriented, HEENT:  PERRl, EOMI  Cardiovascular:  Bradycardia , Irregular rate  Lungs:  Good air movement, no wheezing  Abdomen:  Soft, distended, non-tender,  Musculoskeletal:  Moving all four extremities , 1 + pitting edema present  Skin:  Ecchymosis present on upper extremities   LABS:  Lab 06/21/12 0500 06/20/12 1820  06/20/12 1104  HGB 10.1* 10.2* --  WBC 10.3 11.2* --  PLT 203 203 --  NA 141 -- --  K 3.6 -- --  CL 104 -- --  CO2 28 -- --  GLUCOSE 126* -- --  BUN 16 -- --  CREATININE 0.92 -- --  CALCIUM 8.4 -- --  MG -- -- --  PHOS -- -- --  AST -- -- --  ALT -- -- --  ALKPHOS -- -- --  BILITOT -- -- --  PROT -- -- --  ALBUMIN -- -- --  INR -- -- 0.98  APTT -- -- --  INR -- -- 0.98  LATICACIDVEN -- -- --  TROPONINI -- -- --  PHART -- -- --  PCO2ART -- -- --  PO2ART -- -- --  HCO3 -- -- --  O2SAT -- -- --   IMAGING: No films  DIAGNOSES: Active Problems:  Degenerative spondylolisthesis  Diabetes mellitus  HTN (hypertension)  A-fib  CHF (congestive heart failure)  Anxiety  GERD (gastroesophageal reflux disease)  ASSESSMENT / PLAN:  PULMONARY A: No active issues. P:   Goal SpO2>92 Supplemental oxygen  CARDIOVASCULAR A: A-fib without RVR. Bradycardia.  Hx of CHF, no evidence of exacerbation. P:  Continue Aliskiren , Amiodaore, Clonidine,  Lasix , Labetolol   RENAL A: No active issues. P:   Monitor  GASTROINTESTINAL A:   UC/Crohns disease? P:   Continue Mesalamine   HEMATOLOGIC A:  Anemia - unclear baseline  P:  Trend CBC  ENDOCRINE A:  DM P:   D/c Metformin SSI   NEUROLOGIC A:  S/p Redo decompressive lumbar laminectomy L4-5 in excess will be needed with a standard interbody fusion. P:   Per Neurosurgery   Shan Levans, MD Pulmonary and Critical Care Medicine [facility]  [phone]  Cell  [phone]  If no response or cell goes to voicemail, call beeper [phone]

## 2012-06-21 NOTE — Progress Notes (Signed)
eLink Physician-Brief Progress Note Patient Name: Phillip Chandler DOB: 09-Jun-1930 MRN: [id number]  Date of Service  06/21/2012   HPI/Events of Note   fena < 1  eICU Interventions  Ns bolus   Intervention Category Intermediate Interventions: Oliguria - evaluation and management  Ricahrd Schwager V. 06/21/2012, 5:49 PM

## 2012-06-21 NOTE — Progress Notes (Signed)
Rehab Admissions Coordinator Note:  Patient was screened by Clois Dupes for appropriateness for an Inpatient Acute Rehab Consult.  At this time, we are recommending Inpatient Rehab consult.  Clois Dupes 06/21/2012, 1:42 PM  I can be reached at [phone].

## 2012-06-21 NOTE — Progress Notes (Signed)
Physical Therapy Treatment Patient Details Name: Phillip Chandler MRN: [id number] DOB: 06-18-1930 Today's Date: 06/21/2012 Time: 1191-4782 PT Time Calculation (min): 13 min  PT Assessment / Plan / Recommendation Comments on Treatment Session  Pt with increased fatigue sitting in chair and needs increase (A) to transfer from chair > bed.  Pt with noticeable decrease in BP and RN aware.  Pt returned to bed with BP maintaining however continues to run low.  RN in room.  Continue to recommend inpatient rehab.    Follow Up Recommendations  CIR     Barriers to Discharge None      Equipment Recommendations  None recommended by PT    Recommendations for Other Services Rehab consult  Frequency Min 5X/week   Plan Discharge plan remains appropriate;Frequency remains appropriate    Precautions / Restrictions Precautions Precautions: Back Precaution Booklet Issued: Yes (comment) Precaution Comments: Pt educated on 3/3 back precautions Required Braces or Orthoses: Spinal Brace Spinal Brace: Lumbar corset;Applied in sitting position Restrictions Weight Bearing Restrictions: No   Pertinent Vitals/Pain BP decrease see vitals    Mobility  Bed Mobility Bed Mobility: Rolling Left;Left Sidelying to Sit Rolling Right: 3: Mod assist;With rail Rolling Left: 3: Mod assist;With rail Supine to Sit: 3: Mod assist;With rails;HOB elevated Sitting - Scoot to Edge of Bed: 3: Mod assist;With rail Sit to Supine: 1: +2 Total assist;HOB flat Sit to Supine: Patient Percentage: 20% Details for Bed Mobility Assistance: Facilitation to maintain back precautions. Pt required (A) with BIL LE and side lying to roll supine onto back.  Transfers Transfers: Sit to Stand;Stand to Sit Sit to Stand: 1: +2 Total assist;With upper extremity assist;From bed (pad ) Sit to Stand: Patient Percentage: 20% Stand to Sit: 1: +2 Total assist;To bed;To elevated surface Stand to Sit: Patient Percentage: 10% Stand Pivot Transfers:  1: +2 Total assist Stand Pivot Transfers: Patient Percentage: 50% Squat Pivot Transfers: 1: +2 Total assist Squat Pivot Transfers: Patient Percentage: 10% Details for Transfer Assistance: Pt increased fatigue this second visit with decr BP with map (54) . Pt required facilitation to pivot to the right . Ambulation/Gait Ambulation/Gait Assistance: Not tested (comment)    Exercises     PT Diagnosis: Difficulty walking;Generalized weakness;Acute pain;Abnormality of gait  PT Problem List: Decreased strength;Decreased activity tolerance;Decreased balance;Decreased mobility;Decreased coordination;Decreased knowledge of use of DME;Pain;Decreased knowledge of precautions PT Treatment Interventions: DME instruction;Gait training;Stair training;Functional mobility training;Therapeutic activities;Therapeutic exercise;Balance training;Neuromuscular re-education;Patient/family education   PT Goals Acute Rehab PT Goals PT Goal Formulation: With patient Time For Goal Achievement: 07/05/12 Potential to Achieve Goals: Good Pt will Roll Supine to Left Side: with supervision PT Goal: Rolling Supine to Left Side - Progress: Not met Pt will go Supine/Side to Sit: with supervision PT Goal: Supine/Side to Sit - Progress: Not met Pt will Sit at [facility] of Bed: with modified independence;1-2 min PT Goal: Sit at Edge Of Bed - Progress: Not met Pt will go Sit to Supine/Side: with min assist PT Goal: Sit to Supine/Side - Progress: Not met Pt will go Sit to Stand: with supervision PT Goal: Sit to Stand - Progress: Not met Pt will go Stand to Sit: with supervision PT Goal: Stand to Sit - Progress: Not met Pt will Transfer Bed to Chair/Chair to Bed: with supervision PT Transfer Goal: Bed to Chair/Chair to Bed - Progress: Not met Pt will Ambulate: 16 - 50 feet;with min assist;with rolling walker (with left AFO) PT Goal: Ambulate - Progress: Goal set today  Visit Information  Last PT Received On:  06/21/12 Assistance Needed: +2 PT/OT Co-Evaluation/Treatment: Yes    Subjective Data  Subjective: "I'm having a hard time standing." Patient Stated Goal: To eventually go home after more therapy.  Family agreeable to inpatient rehab   Cognition  Overall Cognitive Status: Appears within functional limits for tasks assessed/performed Arousal/Alertness: Lethargic Orientation Level: Appears intact for tasks assessed Behavior During Session: Lethargic    Balance  Balance Balance Assessed: No Static Sitting Balance Static Sitting - Balance Support: Bilateral upper extremity supported;Feet supported Static Sitting - Level of Assistance: 5: Stand by assistance Static Standing Balance Static Standing - Balance Support: Bilateral upper extremity supported;During functional activity Static Standing - Level of Assistance: 1: +2 Total assist Static Standing - Comment/# of Minutes: (A) to maintain balance due to right side lean and overall unsteady with initial sit stand with wide BOS  End of Session PT - End of Session Equipment Utilized During Treatment: Gait belt;Back brace Activity Tolerance: Patient limited by fatigue Patient left: in bed;with call bell/phone within reach;with nursing in room;with family/visitor present Nurse Communication: Mobility status   GP     Elnoria Livingston 06/21/2012, 2:02 PM

## 2012-06-21 NOTE — Progress Notes (Signed)
Occupational Therapy Treatment Patient Details Name: Phillip Chandler MRN: [id number] DOB: 02-Dec-1930 Today's Date: 06/21/2012 Time: 4098-1191 OT Time Calculation (min): 13 min  OT Assessment / Plan / Recommendation Comments on Treatment Session Pt with decr mobility this session. pt with increased fatigue after sitting in chair for prolonged period. pt with decr BP at this time.    Follow Up Recommendations  CIR    Barriers to Discharge       Equipment Recommendations  3 in 1 bedside comode;Wheelchair (measurements OT);Wheelchair cushion (measurements OT)    Recommendations for Other Services Rehab consult  Frequency Min 2X/week   Plan Discharge plan remains appropriate    Precautions / Restrictions Precautions Precautions: Back Precaution Booklet Issued: Yes (comment) Precaution Comments: Pt educated on 3/3 back precautions Required Braces or Orthoses: Spinal Brace Spinal Brace: Lumbar corset;Applied in sitting position Restrictions Weight Bearing Restrictions: No   Pertinent Vitals/Pain BP decr see vitals    ADL  Eating/Feeding: Set up Where Assessed - Eating/Feeding: Chair Grooming: Wash/dry face;Set up Where Assessed - Grooming: Supported sitting Upper Body Bathing: Chest;Right arm;Left arm;Abdomen;Moderate assistance Where Assessed - Upper Body Bathing: Supported sitting Lower Body Bathing: +1 Total assistance Where Assessed - Lower Body Bathing: Unsupported sitting Upper Body Dressing: Minimal assistance Where Assessed - Upper Body Dressing: Unsupported sitting Lower Body Dressing: +1 Total assistance Where Assessed - Lower Body Dressing: Unsupported sitting Toilet Transfer: +2 Total assistance Toilet Transfer: Patient Percentage: 30% Toilet Transfer Method: Sit to stand Toilet Transfer Equipment: Raised toilet seat with arms (or 3-in-1 over toilet) Equipment Used: Back brace;Gait belt Transfers/Ambulation Related to ADLs: Pt on elevated surface from AM visit.  Pt required total+2 Pt=20% with pad used. pt required total +2 facilitation to pivot Rt into bed using pad to help hip extension. Pt unable to to maintain upright posture. Pt squad pivot to Rt side. ADL Comments: pt with decr BP and RN calling for therapy to (A) with transfer. Pt repositioned supine .Rn in room addressing decr BP    OT Diagnosis: Generalized weakness;Acute pain  OT Problem List: Decreased strength;Decreased activity tolerance;Impaired balance (sitting and/or standing);Decreased safety awareness;Decreased knowledge of use of DME or AE;Decreased knowledge of precautions;Pain OT Treatment Interventions: Self-care/ADL training;DME and/or AE instruction;Therapeutic activities;Patient/family education;Balance training   OT Goals Acute Rehab OT Goals OT Goal Formulation: With patient Time For Goal Achievement: 07/05/12 Potential to Achieve Goals: Good ADL Goals Pt Will Perform Grooming: with set-up;Sitting, chair;Supported ADL Goal: Grooming - Progress: Goal set today Pt Will Perform Upper Body Bathing: with set-up;Sitting, chair;Supported ADL Goal: Upper Body Bathing - Progress: Goal set today Pt Will Perform Upper Body Dressing: with set-up;Sitting, chair;Supported ADL Goal: Patent attorney - Progress: Goal set today Pt Will Transfer to Toilet: with max assist;with DME;3-in-1 ADL Goal: Statistician - Progress: Progressing toward goals Miscellaneous OT Goals Miscellaneous OT Goal #1: Pt will complete bed mobility Min (A) as precursor to adls with HOB <20 degrees no rail as precursor to adls OT Goal: Miscellaneous Goal #1 - Progress: Progressing toward goals  Visit Information  Last OT Received On: 06/21/12 Assistance Needed: +2 PT/OT Co-Evaluation/Treatment: Yes    Subjective Data  Subjective: "No one should mess with you girls. You are strong. i bet your husband doesn't talk back" Patient Stated Goal: to return home with wife   Prior Functioning  Home  Living Lives With: Spouse Available Help at Discharge: Family (wife uses cane or RW as well) Type of Home: House Home Access: Level entry Home  Layout: One level Bathroom Shower/Tub: Walk-in shower;Door [employer]: Standard (3n1 over commode) Bathroom Accessibility: Yes How Accessible: Accessible via walker Home Adaptive Equipment: Bedside commode/3-in-1;Straight cane;Walker - rolling Additional Comments: wife is older than patient with balance deficits as well. Prior Function Level of Independence: Independent with assistive device(s) (with RW) Able to Take Stairs?: No Driving: Yes Vocation: Retired Musician: No difficulties Dominant Hand: Right    Cognition  Overall Cognitive Status: Appears within functional limits for tasks assessed/performed Arousal/Alertness: Lethargic Orientation Level: Appears intact for tasks assessed Behavior During Session: Lethargic    Mobility  Shoulder Instructions Bed Mobility Bed Mobility: Rolling Left;Left Sidelying to Sit Rolling Right: 3: Mod assist;With rail Rolling Left: 3: Mod assist;With rail Supine to Sit: 3: Mod assist;With rails;HOB elevated Sitting - Scoot to Edge of Bed: 3: Mod assist;With rail Sit to Supine: 1: +2 Total assist;HOB flat Sit to Supine: Patient Percentage: 20% Details for Bed Mobility Assistance: Facilitation to maintain back precautions. Pt required (A) with BIL LE and side lying to roll supine onto back.  Transfers Transfers: Sit to Stand;Stand to Sit Sit to Stand: 1: +2 Total assist;With upper extremity assist;From bed (pad ) Sit to Stand: Patient Percentage: 20% Stand to Sit: 1: +2 Total assist;To bed;To elevated surface Stand to Sit: Patient Percentage: 10% Details for Transfer Assistance: Pt increased fatigue this second visit with decr BP with map (54) . Pt required facilitation to pivot to the right .       Exercises      Balance Balance Balance Assessed: Yes Static Sitting  Balance Static Sitting - Balance Support: Bilateral upper extremity supported;Feet supported Static Sitting - Level of Assistance: 5: Stand by assistance Static Sitting - Comment/# of Minutes: 6 Static Standing Balance Static Standing - Balance Support: Bilateral upper extremity supported;During functional activity Static Standing - Level of Assistance: 1: +2 Total assist Static Standing - Comment/# of Minutes: (A) to maintain balance due to right side lean and overall unsteady with initial sit stand with wide BOS   End of Session OT - End of Session Activity Tolerance: Patient tolerated treatment well Patient left: in bed;with call bell/phone within reach;with family/visitor present (x 2 daughters) Nurse Communication: Mobility status;Precautions  GO     Lucile Shutters 06/21/2012, 1:48 PM Pager: [phone]

## 2012-06-21 NOTE — Evaluation (Signed)
Physical Therapy Evaluation Patient Details Name: Phillip Chandler MRN: [id number] DOB: 1930/09/02 Today's Date: 06/21/2012 Time: 1011-1040 PT Time Calculation (min): 29 min  PT Assessment / Plan / Recommendation Clinical Impression  Pt is 79 y/o male admitted for s/p redo this decompression L4-5 posterior lumbar interbody fusion L4-5 L5-S1 .   Pt with hx of left foot drop from previous back surgery and wears AFO.  Pt will benefit from acute PT services to improve overall mobiity, balance, transfer and gait to prepare for safe d/c to next venue.    PT Assessment  Patient needs continued PT services    Follow Up Recommendations  CIR    Barriers to Discharge None      Equipment Recommendations  None recommended by PT    Recommendations for Other Services Rehab consult   Frequency Min 5X/week    Precautions / Restrictions Precautions Precautions: Back Precaution Booklet Issued: Yes (comment) Precaution Comments: Pt educated on 3/3 back precautions Required Braces or Orthoses: Spinal Brace Spinal Brace: Lumbar corset;Applied in sitting position Restrictions Weight Bearing Restrictions: No   Pertinent Vitals/Pain 3/10 back pain prior to mobility; 5/10 back after mobility      Mobility  Bed Mobility Bed Mobility: Rolling Left;Left Sidelying to Sit Rolling Left: 3: Mod assist;With rail Supine to Sit: 3: Mod assist;With rails;HOB elevated Sitting - Scoot to Edge of Bed: 3: Mod assist;With rail Details for Bed Mobility Assistance: facilitation for weight shifting and pad used to progress hips toward EOB. Pt unable to scoot using hands and can not weight shift without (A) Transfers Transfers: Sit to Stand;Stand to Sit;Stand Pivot Transfers Sit to Stand: 1: +2 Total assist;With upper extremity assist;From bed;From elevated surface Sit to Stand: Patient Percentage: 30% Stand to Sit: 1: +2 Total assist;With upper extremity assist;To elevated surface;To chair/3-in-1 Stand to Sit:  Patient Percentage: 30% Stand Pivot Transfers: 1: +2 Total assist Stand Pivot Transfers: Patient Percentage: 50% Details for Transfer Assistance: Pt required x2 sit<>stand with elevated surface and unable to advance Left LE. Pt without left AFO due to edema and unable to donn shoe. Pt sit<>stand third attempt pivot to right with facilitation for weight shift and manual cues to advance LE. Ambulation/Gait Ambulation/Gait Assistance: Not tested (comment)     PT Diagnosis: Difficulty walking;Generalized weakness;Acute pain;Abnormality of gait  PT Problem List: Decreased strength;Decreased activity tolerance;Decreased balance;Decreased mobility;Decreased coordination;Decreased knowledge of use of DME;Pain;Decreased knowledge of precautions PT Treatment Interventions: DME instruction;Gait training;Stair training;Functional mobility training;Therapeutic activities;Therapeutic exercise;Balance training;Neuromuscular re-education;Patient/family education   PT Goals Acute Rehab PT Goals PT Goal Formulation: With patient Time For Goal Achievement: 07/05/12 Potential to Achieve Goals: Good Pt will Roll Supine to Left Side: with supervision PT Goal: Rolling Supine to Left Side - Progress: Goal set today Pt will go Supine/Side to Sit: with supervision PT Goal: Supine/Side to Sit - Progress: Goal set today Pt will Sit at Edge of Bed: with modified independence;1-2 min PT Goal: Sit at Edge Of Bed - Progress: Goal set today Pt will go Sit to Supine/Side: with min assist PT Goal: Sit to Supine/Side - Progress: Goal set today Pt will go Sit to Stand: with supervision PT Goal: Sit to Stand - Progress: Goal set today Pt will go Stand to Sit: with supervision PT Goal: Stand to Sit - Progress: Goal set today Pt will Transfer Bed to Chair/Chair to Bed: with supervision PT Transfer Goal: Bed to Chair/Chair to Bed - Progress: Goal set today Pt will Ambulate: 16 - 50 feet;with min  assist;with rolling walker  (with left AFO) PT Goal: Ambulate - Progress: Goal set today  Visit Information  Last PT Received On: 06/21/12 Assistance Needed: +2 PT/OT Co-Evaluation/Treatment: Yes    Subjective Data  Subjective: "I think the doctor said I'm going to need more rehab before I go home." Patient Stated Goal: to eventually go home   Prior Functioning  Home Living Lives With: Spouse Available Help at Discharge: Family (wife uses cane or RW as well) Type of Home: House Home Access: Level entry Home Layout: One level Bathroom Shower/Tub: Walk-in shower;Door [employer]: Standard (3n1 over commode) Bathroom Accessibility: Yes How Accessible: Accessible via walker Home Adaptive Equipment: Bedside commode/3-in-1;Straight cane;Walker - rolling Additional Comments: wife is older than patient with balance deficits as well. Prior Function Level of Independence: Independent with assistive device(s) (with RW) Able to Take Stairs?: No Driving: Yes Vocation: Retired Musician: No difficulties Dominant Hand: Right    Cognition  Overall Cognitive Status: Appears within functional limits for tasks assessed/performed Arousal/Alertness: Awake/alert Orientation Level: Appears intact for tasks assessed Behavior During Session: [facility] for tasks performed    Extremity/Trunk Assessment Right Upper Extremity Assessment RUE ROM/Strength/Tone: [facility] for tasks assessed;Due to precautions RUE Coordination: WFL - gross motor (deficits with fine motor due to edema) Left Upper Extremity Assessment LUE ROM/Strength/Tone: WFL for tasks assessed;Due to precautions LUE Coordination: WFL - gross motor (deficits noted due to edema at hand and wrist) Right Lower Extremity Assessment RLE ROM/Strength/Tone: Deficits;Unable to fully assess;Due to precautions (At least 2+/5 gross) RLE Sensation: Deficits (Impaired light touch) Left Lower Extremity Assessment LLE ROM/Strength/Tone: Deficits;Unable to fully  assess;Due to pain LLE ROM/Strength/Tone Deficits: Pt with left foot drop from previous back surgery and wears left AFO.  Pt with noticeable left LE knee buckling during transfer and unable to advance. LLE Sensation: Deficits LLE Sensation Deficits: Impaired light touch   Balance Balance Balance Assessed: Yes Static Sitting Balance Static Sitting - Balance Support: Bilateral upper extremity supported;Feet supported Static Sitting - Level of Assistance: 5: Stand by assistance Static Sitting - Comment/# of Minutes: 6 Static Standing Balance Static Standing - Balance Support: Bilateral upper extremity supported;During functional activity Static Standing - Level of Assistance: 1: +2 Total assist Static Standing - Comment/# of Minutes: (A) to maintain balance due to right side lean and overall unsteady with initial sit stand with wide BOS  End of Session PT - End of Session Equipment Utilized During Treatment: Gait belt;Back brace Activity Tolerance: Patient limited by fatigue Patient left: in chair;with call bell/phone within reach Nurse Communication: Mobility status  GP     Denton Derks 06/21/2012, 1:09 PM [name], PT DPT [phone]

## 2012-06-21 NOTE — Evaluation (Signed)
Occupational Therapy Evaluation Patient Details Name: Phillip Chandler MRN: [id number] DOB: 10/06/1930 Today's Date: 06/21/2012 Time: 1012-1050 OT Time Calculation (min): 38 min  OT Assessment / Plan / Recommendation Clinical Impression  79 yo male s/p redo this decompression L4-5 posterior lumbar interbody fusion L4-5 L5-S1 . Pt with Lt foot drop and wears a brace. Ot to follow acutely. Recommend CIR for d/c planning    OT Assessment  Patient needs continued OT Services    Follow Up Recommendations  CIR    Barriers to Discharge      Equipment Recommendations  3 in 1 bedside comode;Wheelchair (measurements OT);Wheelchair cushion (measurements OT)    Recommendations for Other Services Rehab consult  Frequency  Min 2X/week    Precautions / Restrictions Precautions Precautions: Back Precaution Booklet Issued: Yes (comment) Precaution Comments: Pt educated on 3/3 back precautions Required Braces or Orthoses: Spinal Brace Spinal Brace: Lumbar corset;Applied in sitting position Restrictions Weight Bearing Restrictions: No   Pertinent Vitals/Pain     ADL  Eating/Feeding: Set up Where Assessed - Eating/Feeding: Chair Grooming: Wash/dry face;Set up Where Assessed - Grooming: Supported sitting Upper Body Bathing: Chest;Right arm;Left arm;Abdomen;Moderate assistance Where Assessed - Upper Body Bathing: Supported sitting Lower Body Bathing: +1 Total assistance Where Assessed - Lower Body Bathing: Unsupported sitting Upper Body Dressing: Minimal assistance Where Assessed - Upper Body Dressing: Unsupported sitting Lower Body Dressing: +1 Total assistance Where Assessed - Lower Body Dressing: Unsupported sitting Toilet Transfer: +2 Total assistance Toilet Transfer: Patient Percentage: 30% Toilet Transfer Method: Sit to stand Toilet Transfer Equipment: Raised toilet seat with arms (or 3-in-1 over toilet) Equipment Used: Gait belt Transfers/Ambulation Related to ADLs: Pt must  transfer to the Right side. Pt is unable to lift Lt foot and pivot to left. pt with foot drop on lt side however boot unable to don due to BIL LE edema. Pt will need to elevated BIL LE to decr edema for don of boots.  ADL Comments: Pt required (A) to sequence bed mobility and maintain back precautions. pt with noted edema in Lt UE and open wounds covered with clear tegaderm. Pt able to maintain sitting balance with shoulder flexion demonstrating fair balance. (total (A) to don brace)    OT Diagnosis: Generalized weakness;Acute pain  OT Problem List: Decreased strength;Decreased activity tolerance;Impaired balance (sitting and/or standing);Decreased safety awareness;Decreased knowledge of use of DME or AE;Decreased knowledge of precautions;Pain OT Treatment Interventions: Self-care/ADL training;DME and/or AE instruction;Therapeutic activities;Patient/family education;Balance training   OT Goals Acute Rehab OT Goals OT Goal Formulation: With patient Time For Goal Achievement: 07/05/12 Potential to Achieve Goals: Good ADL Goals Pt Will Perform Grooming: with set-up;Sitting, chair;Supported ADL Goal: Grooming - Progress: Goal set today Pt Will Perform Upper Body Bathing: with set-up;Sitting, chair;Supported ADL Goal: Upper Body Bathing - Progress: Goal set today Pt Will Perform Upper Body Dressing: with set-up;Sitting, chair;Supported ADL Goal: Patent attorney - Progress: Goal set today Pt Will Transfer to Toilet: with max assist;with DME;3-in-1 ADL Goal: Toilet Transfer - Progress: Goal set today Miscellaneous OT Goals Miscellaneous OT Goal #1: Pt will complete bed mobility Min (A) as precursor to adls with HOB <20 degrees no rail as precursor to adls OT Goal: Miscellaneous Goal #1 - Progress: Goal set today  Visit Information  Last OT Received On: 05/21/13 Assistance Needed: +2 PT/OT Co-Evaluation/Treatment: Yes    Subjective Data  Subjective: "No one should mess with you girls. You  are strong. i bet your husband doesn't talk back" Patient Stated Goal: to  return home with wife   Prior Functioning     Home Living Lives With: Spouse Available Help at Discharge: Family (wife uses cane or RW as well) Type of Home: House Home Access: Level entry Home Layout: One level Bathroom Shower/Tub: Walk-in shower;Door [employer]: Standard (3n1 over commode) Bathroom Accessibility: Yes How Accessible: Accessible via walker Home Adaptive Equipment: Bedside commode/3-in-1;Straight cane;Walker - rolling Additional Comments: wife is older than patient with balance deficits as well. Prior Function Level of Independence: Independent with assistive device(s) (with RW) Able to Take Stairs?: No Driving: Yes Vocation: Retired Musician: No difficulties Dominant Hand: Right         Vision/Perception     Cognition  Overall Cognitive Status: Appears within functional limits for tasks assessed/performed Arousal/Alertness: Awake/alert Orientation Level: Appears intact for tasks assessed Behavior During Session: [facility] for tasks performed    Extremity/Trunk Assessment Right Upper Extremity Assessment RUE ROM/Strength/Tone: [facility] for tasks assessed;Due to precautions RUE Coordination: WFL - gross motor (deficits with fine motor due to edema) Left Upper Extremity Assessment LUE ROM/Strength/Tone: WFL for tasks assessed;Due to precautions LUE Coordination: WFL - gross motor (deficits noted due to edema at hand and wrist) Right Lower Extremity Assessment RLE ROM/Strength/Tone: Deficits;Unable to fully assess;Due to precautions (At least 2+/5 gross) RLE Sensation: Deficits (Impaired light touch) Left Lower Extremity Assessment LLE ROM/Strength/Tone: Deficits;Unable to fully assess;Due to pain LLE ROM/Strength/Tone Deficits: Pt with left foot drop from previous back surgery and wears left AFO.  Pt with noticeable left LE knee buckling during transfer and unable  to advance. LLE Sensation: Deficits LLE Sensation Deficits: Impaired light touch     Mobility Bed Mobility Bed Mobility: Rolling Left;Left Sidelying to Sit Rolling Left: 3: Mod assist;With rail Supine to Sit: 3: Mod assist;With rails;HOB elevated Sitting - Scoot to Edge of Bed: 3: Mod assist;With rail Details for Bed Mobility Assistance: facilitation for weight shifting and pad used to progress hips toward EOB. Pt unable to scoot using hands and can not weight shift without (A) Transfers Transfers: Sit to Stand;Stand to Sit Sit to Stand: 1: +2 Total assist;With upper extremity assist;From bed;From elevated surface Sit to Stand: Patient Percentage: 30% Stand to Sit: 1: +2 Total assist;With upper extremity assist;To elevated surface;To chair/3-in-1 Stand to Sit: Patient Percentage: 30% Details for Transfer Assistance: Pt required x2 sit<>stand with elevated surface and unable to advance lt foot. Pt without boot due to edema. pt sit<>stand third attempt pivot to right with facilitation for weight shift.     Shoulder Instructions     Exercise     Balance Balance Balance Assessed: Yes Static Sitting Balance Static Sitting - Balance Support: Bilateral upper extremity supported;Feet supported Static Sitting - Level of Assistance: 5: Stand by assistance Static Sitting - Comment/# of Minutes: 6 Static Standing Balance Static Standing - Balance Support: Bilateral upper extremity supported;During functional activity Static Standing - Level of Assistance: 1: +2 Total assist Static Standing - Comment/# of Minutes: 1 min   End of Session OT - End of Session Activity Tolerance: Patient tolerated treatment well Patient left: in chair;with call bell/phone within reach Nurse Communication: Mobility status;Precautions  GO     Lucile Shutters 06/21/2012, 12:53 PM Pager: [phone]

## 2012-06-21 NOTE — Clinical Documentation Improvement (Signed)
BMI DOCUMENTATION CLARIFICATION QUERY  THIS DOCUMENT IS NOT A PERMANENT PART OF THE MEDICAL RECORD         06/21/12  Dear Dr. Wynetta Emery,  In an effort to better capture your patient's severity of illness, reflect appropriate length of stay and utilization of resources, a review of the patient medical record has revealed the following indicators.   Based on your clinical judgment, please clarify and document in a progress note and/or discharge summary the clinical condition associated with the following supporting information: In responding to this query please exercise your independent judgment.  The fact that a query is asked, does not imply that any particular answer is desired or expected.   Hello Dr. Wynetta Emery!   According to the documented Height and Weight in CHL/EPIC, the patients BMI is 43.24. If your clinical findings/judgment agrees with this,if possible could you please help clarify the suspected diagnosis in the progress note and discharge summary. THANK YOU!    [facility]: A diagnosis of UNDERWEIGHT or MORBID OBESITY should have the BMI documented along with it.  Possible Clinical Conditions?  - Morbid Obesity  - Other condition (please document in the progress notes and/or discharge summary)  - Cannot Clinically determine at this time   Supporting Information: Estimated Body mass index is 43.24 kg/(m^2) as calculated from the following:   Height as of this encounter: 5\' 8" (1.727 m).   Weight as of this encounter: 284 lb 6.3 oz(129 kg).    No additional documentation in chart upon review. SM    Thank You,  Saul Fordyce  Clinical Documentation Specialist: [phone] Pager  Health Information Management [place]

## 2012-06-21 NOTE — Progress Notes (Signed)
eLink Physician-Brief Progress Note Patient Name: Phillip Chandler DOB: 1931-02-25 MRN: [id number]  Date of Service  06/21/2012   HPI/Events of Note   Presumed hypovolemia, Bp drop  eICU Interventions  Dc tekturna Change fluids to NS 100/h Chk urine lytes - push volume if pre-renal   Intervention Category Intermediate Interventions: Oliguria - evaluation and management  ALVA,RAKESH V. 06/21/2012, 3:42 PM

## 2012-06-21 NOTE — Progress Notes (Signed)
Utilization review completed.  

## 2012-06-22 ENCOUNTER — Inpatient Hospital Stay (HOSPITAL_COMMUNITY): Payer: Medicare Other

## 2012-06-22 LAB — CBC WITH DIFFERENTIAL/PLATELET
Basophils Absolute: 0 10*3/uL (ref 0.0–0.1)
Basophils Relative: 0 % (ref 0–1)
Eosinophils Absolute: 0 10*3/uL (ref 0.0–0.7)
Eosinophils Relative: 0 % (ref 0–5)
HCT: 32.8 % — ABNORMAL LOW (ref 39.0–52.0)
Hemoglobin: 10.6 g/dL — ABNORMAL LOW (ref 13.0–17.0)
Lymphocytes Relative: 5 % — ABNORMAL LOW (ref 12–46)
Lymphs Abs: 0.8 10*3/uL (ref 0.7–4.0)
MCH: 29.3 pg (ref 26.0–34.0)
MCHC: 32.3 g/dL (ref 30.0–36.0)
MCV: 90.6 fL (ref 78.0–100.0)
Monocytes Absolute: 2.4 10*3/uL — ABNORMAL HIGH (ref 0.1–1.0)
Monocytes Relative: 14 % — ABNORMAL HIGH (ref 3–12)
Neutro Abs: 14.2 10*3/uL — ABNORMAL HIGH (ref 1.7–7.7)
Neutrophils Relative %: 81 % — ABNORMAL HIGH (ref 43–77)
Platelets: 182 10*3/uL (ref 150–400)
RBC: 3.62 MIL/uL — ABNORMAL LOW (ref 4.22–5.81)
RDW: 14.8 % (ref 11.5–15.5)
WBC: 17.4 10*3/uL — ABNORMAL HIGH (ref 4.0–10.5)

## 2012-06-22 LAB — BASIC METABOLIC PANEL
BUN: 17 mg/dL (ref 6–23)
BUN: 13 mg/dL (ref 6–23)
CO2: 29 mEq/L (ref 19–32)
CO2: 24 mEq/L (ref 19–32)
Calcium: 8.4 mg/dL (ref 8.4–10.5)
Calcium: 8.9 mg/dL (ref 8.4–10.5)
Chloride: 108 mEq/L (ref 96–112)
Chloride: 104 mEq/L (ref 96–112)
Creatinine, Ser: 1.06 mg/dL (ref 0.50–1.35)
Creatinine, Ser: 0.69 mg/dL (ref 0.50–1.35)
GFR calc Af Amer: 75 mL/min — ABNORMAL LOW (ref >90)
GFR calc Af Amer: >90 mL/min (ref >90)
GFR calc non Af Amer: 65 mL/min — ABNORMAL LOW (ref >90)
GFR calc non Af Amer: 88 mL/min — ABNORMAL LOW (ref >90)
Glucose, Bld: 123 mg/dL — ABNORMAL HIGH (ref 70–99)
Glucose, Bld: 158 mg/dL — ABNORMAL HIGH (ref 70–99)
Potassium: 3.6 mEq/L (ref 3.5–5.1)
Potassium: 3.7 mEq/L (ref 3.5–5.1)
Sodium: 144 mEq/L (ref 135–145)
Sodium: 141 mEq/L (ref 135–145)

## 2012-06-22 LAB — CBC
HCT: 30.5 % — ABNORMAL LOW (ref 39.0–52.0)
Hemoglobin: 9.6 g/dL — ABNORMAL LOW (ref 13.0–17.0)
MCH: 28.7 pg (ref 26.0–34.0)
MCHC: 31.5 g/dL (ref 30.0–36.0)
MCV: 91.3 fL (ref 78.0–100.0)
Platelets: 193 10*3/uL (ref 150–400)
RBC: 3.34 MIL/uL — ABNORMAL LOW (ref 4.22–5.81)
RDW: 15 % (ref 11.5–15.5)
WBC: 13.9 10*3/uL — ABNORMAL HIGH (ref 4.0–10.5)

## 2012-06-22 LAB — GLUCOSE, CAPILLARY
Glucose-Capillary: 118 mg/dL — ABNORMAL HIGH (ref 70–99)
Glucose-Capillary: 137 mg/dL — ABNORMAL HIGH (ref 70–99)
Glucose-Capillary: 144 mg/dL — ABNORMAL HIGH (ref 70–99)
Glucose-Capillary: 139 mg/dL — ABNORMAL HIGH (ref 70–99)

## 2012-06-22 MED ORDER — BISACODYL 10 MG RE SUPP
10.0000 mg | Freq: Every day | RECTAL | Status: DC | PRN
  Administered 2012-06-22: 10 mg via RECTAL
  Filled 2012-06-22 (×2): qty 1

## 2012-06-22 MED ORDER — HYDRALAZINE HCL 20 MG/ML IJ SOLN
INTRAMUSCULAR | Status: AC
  Filled 2012-06-22: qty 1

## 2012-06-22 MED ORDER — HYDRALAZINE HCL 20 MG/ML IJ SOLN
20.0000 mg | Freq: Four times a day (QID) | INTRAMUSCULAR | Status: AC
  Administered 2012-06-22 – 2012-06-23 (×5): 20 mg via INTRAVENOUS
  Filled 2012-06-22 (×6): qty 1

## 2012-06-22 MED FILL — Sodium Chloride Irrigation Soln 0.9%: Qty: 1000 | Status: AC

## 2012-06-22 MED FILL — Heparin Sodium (Porcine) Inj 1000 Unit/ML: INTRAMUSCULAR | Qty: 30 | Status: AC

## 2012-06-22 MED FILL — Sodium Chloride IV Soln 0.9%: INTRAVENOUS | Qty: 1000 | Status: AC

## 2012-06-22 NOTE — Progress Notes (Signed)
Dr Danielle Dess notified of patient's orientation to self and place but not time and situation. Patient appears to be in pain but is confused when asked orientation questions. Dr. Wynetta Emery notified by Dr. Danielle Dess. Dr Wynetta Emery is bedside and will adjust pain meds. CBC and electrolytes drawn. Will continue to assess.

## 2012-06-22 NOTE — Progress Notes (Signed)
Dr. Danielle Dess at bedside speaking with patient's family. Reiterated that we will plan to not give any pain medication until patient's neuro status improves. Ordered to hold PO medications tonight as patient does not seem awake enough to swallow. Will continue to monitor patient.

## 2012-06-22 NOTE — Progress Notes (Signed)
Subjective: Patient reports Still lobe of this morning he has less headache he is having some hip pain and back pain he still reports that his legs feel significantly improved from preop and he denies any new numbness or tingling in his legs or his feet.  Objective: Vital signs in last 24 hours: Temp:  [97.5 F (36.4 C)-101.4 F (38.6 C)] 98.5 F (36.9 C) (01/22 0754) Pulse Rate:  [62-97] 93  (01/22 0900) Resp:  [14-21] 16  (01/22 0900) BP: (90-180)/(43-82) 172/68 mmHg (01/22 0900) SpO2:  [92 %-98 %] 98 % (01/22 0900) Weight:  [129.6 kg (285 lb 11.5 oz)] 129.6 kg (285 lb 11.5 oz) (01/22 0500)  Intake/Output from previous day: 01/21 0701 - 01/22 0700 In: 2465.8 [I.V.:1915.8; IV Piggyback:550] Out: 1405 [Urine:1145; Drains:260] Intake/Output this shift: Total I/O In: 200 [I.V.:200] Out: 130 [Urine:130]  Patient is awake alert oriented strength is 4+ out of 5 as a baseline right lower extremity left lower 70s also is baseline with a left foot drop his wound is clean and dry with no drainage no swelling  Lab Results:  Basename 06/22/12 0610 06/21/12 0500  WBC 13.9* 10.3  HGB 9.6* 10.1*  HCT 30.5* 30.9*  PLT 193 203   BMET  Basename 06/22/12 0610 06/21/12 1828  NA 144 141  K 3.6 4.1  CL 108 104  CO2 29 27  GLUCOSE 123* 130*  BUN 17 20  CREATININE 1.06 1.29  CALCIUM 8.4 8.5    Studies/Results: Dg Lumbar Spine 2-3 Views  06/20/2012  *RADIOLOGY REPORT*  Clinical Data: L4-S1 PLIF  DG C-ARM 61-120 MIN, LUMBAR SPINE - 2-3 VIEW  Technique: Two fluoroscopic intraoperative spot views of the lower lumbar spine are provided.  Comparison:  None.  Findings: Provided images demonstrate interbody spacers and pedicle screws from L4-S1.  IMPRESSION: L4-S1 PLIF in progress.   Original Report Authenticated By: Holley Dexter, M.D.    Dg Chest Port 1 View  06/20/2012  *RADIOLOGY REPORT*  Clinical Data: 79 year old male line placement.  PORTABLE CHEST - 1 VIEW  Comparison: 0859 hours the  same day and earlier.  Findings: Portable semi upright AP view 1847 hours.  Curve plastic catheter projects over the right base of the neck and clavicle.  No other central line identified.  Stable cardiomegaly and mediastinal contours.  No pneumothorax. Mildly increased retrocardiac opacity.  Increased vascular congestion, no definite overt pulmonary edema.  IMPRESSION: 1.  Malpositioned catheter projecting at the right lower neck/upper chest.  Probably an IJ approach line which has coursed into a superficial vein.  Recommend removal and repositioning. 2.  Increased vascular congestion and retrocardiac atelectasis.  #1 discussed with Dr. Judie Petit by telephone on 06/20/12 at 1901 hours.   Original Report Authenticated By: Erskine Speed, M.D.    Dg C-arm [phone] Min  06/20/2012  *RADIOLOGY REPORT*  Clinical Data: L4-S1 PLIF  DG C-ARM 61-120 MIN, LUMBAR SPINE - 2-3 VIEW  Technique: Two fluoroscopic intraoperative spot views of the lower lumbar spine are provided.  Comparison:  None.  Findings: Provided images demonstrate interbody spacers and pedicle screws from L4-S1.  IMPRESSION: L4-S1 PLIF in progress.   Original Report Authenticated By: Holley Dexter, M.D.     Assessment/Plan: Continue flat bedrest with log roll privileges for another 24 hours will start mobilizing the patient tomorrow. Urine output significantly improved blood pressure slightly hypertensive critical-care plug initiating his antihypertensives again after being put on hold. His electrolytes are also improving.  LOS: 2 days  Trentin Knappenberger P 06/22/2012, 10:20 AM

## 2012-06-22 NOTE — Progress Notes (Signed)
PULMONARY  / CRITICAL CARE MEDICINE Consult Note   Name: Phillip Chandler MRN: [id number] DOB: 1931/04/16    LOS: 2  REFERRING MD :  Mariam Dollar, MD ( Neurosurgery)  CHIEF COMPLAINT:  Medical management  BRIEF PATIENT DESCRIPTION: 79 yo with DM, HTN, CHF , CVA , GERD, Anxiety, Arthritis with severe lumbar spinal stenosis chair disease and degenerative spondylolisthesis at L4-5 as well as L5-S1 s/p  stabilization procedure at these 2 levels.  PCCM consulted for medical management .   LINES / TUBES: PIV  CULTURES: None  ANTIBIOTICS: Ancef 1/20 x 1  SIGNIFICANT EVENTS:  1/20 Stabilization L4-L5 and L5-S1 procedure   LEVEL OF CARE:  ICU  PRIMARY SERVICE:  Neurosurgery  CONSULTANTS:  PCCM  DIET:  As tolerated  DVT Px:  SCD  GI Px:  Protonix    INTERVAL HISTORY:  Oliguric and hypotensive 1/21, responded to volume  FENA <1.  VITAL SIGNS: Temp:  [97.5 F (36.4 C)-101.4 F (38.6 C)] 98.5 F (36.9 C) (01/22 0754) Pulse Rate:  [62-97] 93  (01/22 0900) Resp:  [14-21] 16  (01/22 0900) BP: (90-180)/(43-82) 172/68 mmHg (01/22 0900) SpO2:  [92 %-98 %] 98 % (01/22 0900) Arterial Line BP: (140)/(60) 140/60 mmHg (01/21 1000) Weight:  [129.6 kg (285 lb 11.5 oz)] 129.6 kg (285 lb 11.5 oz) (01/22 0500)    INTAKE / OUTPUT: Intake/Output      01/21 0701 - 01/22 0700 01/22 0701 - 01/23 0700   I.V. (mL/kg) 1915.8 (14.8) 200 (1.5)   Blood     IV Piggyback 550    Total Intake(mL/kg) 2465.8 (19) 200 (1.5)   Urine (mL/kg/hr) 1145 (0.4) 130   Drains 260    Blood     Total Output 1405 130   Net +1060.8 +70          PHYSICAL EXAMINATION: General:  In mild discomfort Neuro:  Alert and Oriented, HEENT:  PERRl, EOMI  Cardiovascular:  Bradycardia , Irregular rate  Lungs:  Good air movement, no wheezing  Abdomen:  Soft, distended, non-tender,  Musculoskeletal:  Moving all four extremities , 1 + pitting edema present  Skin:  Ecchymosis present on upper extremities   LABS:  Lab  06/22/12 0610 06/21/12 1828 06/21/12 1645 06/21/12 0500 06/20/12 1820 06/20/12 1104  HGB 9.6* -- -- 10.1* 10.2* --  WBC 13.9* -- -- 10.3 11.2* --  PLT 193 -- -- 203 203 --  NA 144 141 139 141 -- --  K 3.6 4.1 -- -- -- --  CL 108 104 102 104 -- --  CO2 29 27 23 28  -- --  GLUCOSE 123* 130* 118* 126* -- --  BUN 17 20 21 16  -- --  CREATININE 1.06 1.29 1.32 0.92 -- --  CALCIUM 8.4 8.5 8.4 8.4 -- --  MG -- -- -- -- -- --  PHOS -- -- -- -- -- --  AST -- -- -- -- -- --  ALT -- -- -- -- -- --  ALKPHOS -- -- -- -- -- --  BILITOT -- -- -- -- -- --  PROT -- -- -- -- -- --  ALBUMIN -- -- -- -- -- --  INR -- -- -- -- -- 0.98  APTT -- -- -- -- -- --  INR -- -- -- -- -- 0.98  LATICACIDVEN -- -- -- -- -- --  TROPONINI -- -- -- -- -- --  PHART -- -- -- -- -- --  PCO2ART -- -- -- -- -- --  PO2ART -- -- -- -- -- --  HCO3 -- -- -- -- -- --  O2SAT -- -- -- -- -- --   IMAGING: No films  DIAGNOSES: Active Problems:  Degenerative spondylolisthesis  Diabetes mellitus  HTN (hypertension)  A-fib  CHF (congestive heart failure)  Anxiety  GERD (gastroesophageal reflux disease)  ASSESSMENT / PLAN:  PULMONARY A: No active issues. P:   Goal SpO2>92 Supplemental oxygen  CARDIOVASCULAR A: A-fib without RVR. Bradycardia.  Hx of CHF, no evidence of exacerbation. P:  Continue Aliskiren , Amiodaore, Clonidine, Lasix , Labetolol ,  tekturna held  RENAL A: oliguria and FENA <1 better with volume. tekturna held P:   Monitor  GASTROINTESTINAL A:   UC/Crohns disease? P:   Continue Mesalamine   HEMATOLOGIC A:  Anemia - unclear baseline  P:  Trend CBC  ENDOCRINE A:  DM P:   SSI   NEUROLOGIC A:  S/p Redo decompressive lumbar laminectomy L4-5 in excess will be needed with a standard interbody fusion. P:   Per Neurosurgery   Shan Levans, MD Pulmonary and Critical Care Medicine [facility]  [phone]  Cell  [phone]  If no response or cell goes to  voicemail, call beeper [phone]

## 2012-06-22 NOTE — Progress Notes (Signed)
Patient ID: Phillip Chandler, male   DOB: 02-17-1931, 79 y.o.   MRN: [id number] Daughter anxious about father's condition. At this time he appears to be disoriented mumbles words but appears to be breathing well and moves all extremities to command. He is not complaining of pain at the current time. I expressed the fact that the patient's current delirium may be related to the surgery itself or to medication but have not advised any further medications as nothing will help to clear the delirium other than the passage of time. Reassured the daughter that he is receiving. For period discussed concerns about the potential for respiratory distress and the possibility of needing respiratory support however at this time the patient appears stable. Will continue to observe him clinically. Reassured daughter that all appropriate medical care has been given.

## 2012-06-22 NOTE — Progress Notes (Signed)
Dr. Delford Field notified of patient's increased BP. Hydralazine 20 mg ordered and given. Will continue to assess.

## 2012-06-22 NOTE — Progress Notes (Signed)
Agree with OT/PT Cancellation Note.  [place], [place] DPT [phone]

## 2012-06-22 NOTE — Clinical Social Work Psychosocial (Signed)
     Clinical Social Work Department BRIEF PSYCHOSOCIAL ASSESSMENT 06/22/2012  Patient:  Phillip Chandler, Phillip Chandler     Account Number:  [id number]     Admit date:  06/20/2012  Clinical Social Worker:  Robin Searing  Date/Time:  06/22/2012 11:03 AM  Referred by:  Physician  Date Referred:  06/22/2012 Referred for  SNF Placement   Other Referral:   Interview type:  Patient Other interview type:    PSYCHOSOCIAL DATA Living Status:  WIFE Admitted from facility:   Level of care:   Primary support name:  Wife- Phillip Chandler Primary support relationship to patient:  SPOUSE Degree of support available:   good    CURRENT CONCERNS Current Concerns  Post-Acute Placement   Other Concerns:    SOCIAL WORK ASSESSMENT / PLAN Met with patient to discuss possible d/c needs and he is receptive to possible SNF- states he lives in [place] and "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

## 2012-06-22 NOTE — Progress Notes (Signed)
OT / PT  Cancellation Note  Patient Details Name: Phillip Chandler MRN: [id number] DOB: 1931-04-27   Cancelled Treatment:    Reason Eval/Treat Not Completed: Patient not medically ready (flat bedrest at this time)  Lucile Shutters Pager: [phone]  06/22/2012, 11:34 AM

## 2012-06-23 ENCOUNTER — Inpatient Hospital Stay (HOSPITAL_COMMUNITY): Payer: Medicare Other

## 2012-06-23 DIAGNOSIS — I5031 Acute diastolic (congestive) heart failure: Secondary | ICD-10-CM

## 2012-06-23 DIAGNOSIS — F411 Generalized anxiety disorder: Secondary | ICD-10-CM

## 2012-06-23 DIAGNOSIS — I517 Cardiomegaly: Secondary | ICD-10-CM

## 2012-06-23 LAB — GLUCOSE, CAPILLARY
Glucose-Capillary: 156 mg/dL — ABNORMAL HIGH (ref 70–99)
Glucose-Capillary: 140 mg/dL — ABNORMAL HIGH (ref 70–99)
Glucose-Capillary: 155 mg/dL — ABNORMAL HIGH (ref 70–99)
Glucose-Capillary: 159 mg/dL — ABNORMAL HIGH (ref 70–99)

## 2012-06-23 LAB — BLOOD GAS, ARTERIAL
Acid-Base Excess: 3.2 mmol/L — ABNORMAL HIGH (ref 0.0–2.0)
Bicarbonate: 26.1 mEq/L — ABNORMAL HIGH (ref 20.0–24.0)
Drawn by: 32470
O2 Content: 4 L/min
O2 Saturation: 96.5 %
Patient temperature: 98.6
TCO2: 27.1 mmol/L (ref 0–100)
pCO2 arterial: 32.1 mmHg — ABNORMAL LOW (ref 35.0–45.0)
pH, Arterial: 7.52 — ABNORMAL HIGH (ref 7.350–7.450)
pO2, Arterial: 73 mmHg — ABNORMAL LOW (ref 80.0–100.0)

## 2012-06-23 LAB — BASIC METABOLIC PANEL
BUN: 14 mg/dL (ref 6–23)
CO2: 27 mEq/L (ref 19–32)
Calcium: 9.1 mg/dL (ref 8.4–10.5)
Chloride: 104 mEq/L (ref 96–112)
Creatinine, Ser: 0.72 mg/dL (ref 0.50–1.35)
GFR calc Af Amer: >90 mL/min (ref >90)
GFR calc non Af Amer: 86 mL/min — ABNORMAL LOW (ref >90)
Glucose, Bld: 159 mg/dL — ABNORMAL HIGH (ref 70–99)
Potassium: 3.4 mEq/L — ABNORMAL LOW (ref 3.5–5.1)
Sodium: 143 mEq/L (ref 135–145)

## 2012-06-23 LAB — HEPATIC FUNCTION PANEL
ALT: 15 U/L (ref 0–53)
AST: 29 U/L (ref 0–37)
Albumin: 3.1 g/dL — ABNORMAL LOW (ref 3.5–5.2)
Alkaline Phosphatase: 43 U/L (ref 39–117)
Bilirubin, Direct: 0.2 mg/dL (ref 0.0–0.3)
Indirect Bilirubin: 0.4 mg/dL (ref 0.3–0.9)
Total Bilirubin: 0.6 mg/dL (ref 0.3–1.2)
Total Protein: 6.7 g/dL (ref 6.0–8.3)

## 2012-06-23 LAB — CBC
HCT: 31.8 % — ABNORMAL LOW (ref 39.0–52.0)
Hemoglobin: 10.3 g/dL — ABNORMAL LOW (ref 13.0–17.0)
MCH: 29 pg (ref 26.0–34.0)
MCHC: 32.4 g/dL (ref 30.0–36.0)
MCV: 89.6 fL (ref 78.0–100.0)
Platelets: 218 10*3/uL (ref 150–400)
RBC: 3.55 MIL/uL — ABNORMAL LOW (ref 4.22–5.81)
RDW: 14.7 % (ref 11.5–15.5)
WBC: 17.1 10*3/uL — ABNORMAL HIGH (ref 4.0–10.5)

## 2012-06-23 LAB — TROPONIN I: Troponin I: 0.53 ng/mL (ref <0.30)

## 2012-06-23 MED ORDER — VANCOMYCIN HCL 10 G IV SOLR
2000.0000 mg | Freq: Once | INTRAVENOUS | Status: AC
  Administered 2012-06-23: 2000 mg via INTRAVENOUS
  Filled 2012-06-23: qty 2000

## 2012-06-23 MED ORDER — AMIODARONE HCL IN DEXTROSE 360-4.14 MG/200ML-% IV SOLN
1.0000 mg/min | INTRAVENOUS | Status: AC
  Administered 2012-06-23: 1 mg/min via INTRAVENOUS
  Filled 2012-06-23 (×2): qty 200

## 2012-06-23 MED ORDER — VANCOMYCIN HCL 10 G IV SOLR
1250.0000 mg | Freq: Two times a day (BID) | INTRAVENOUS | Status: DC
  Administered 2012-06-24 – 2012-06-26 (×5): 1250 mg via INTRAVENOUS
  Filled 2012-06-23 (×7): qty 1250

## 2012-06-23 MED ORDER — PIPERACILLIN-TAZOBACTAM 3.375 G IVPB
3.3750 g | Freq: Three times a day (TID) | INTRAVENOUS | Status: DC
  Administered 2012-06-23 – 2012-06-27 (×12): 3.375 g via INTRAVENOUS
  Filled 2012-06-23 (×14): qty 50

## 2012-06-23 MED ORDER — LABETALOL HCL 5 MG/ML IV SOLN
INTRAVENOUS | Status: AC
  Filled 2012-06-23: qty 4

## 2012-06-23 MED ORDER — AMIODARONE LOAD VIA INFUSION: 150.0000 mg | Freq: Once | INTRAVENOUS | Status: DC

## 2012-06-23 MED ORDER — BIOTENE DRY MOUTH MT LIQD
15.0000 mL | Freq: Two times a day (BID) | OROMUCOSAL | Status: DC
  Administered 2012-06-23 – 2012-06-24 (×4): 15 mL via OROMUCOSAL

## 2012-06-23 MED ORDER — ASPIRIN EC 81 MG PO TBEC
81.0000 mg | DELAYED_RELEASE_TABLET | Freq: Every day | ORAL | Status: DC
  Administered 2012-06-25 – 2012-06-28 (×4): 81 mg via ORAL
  Filled 2012-06-23 (×6): qty 1

## 2012-06-23 MED ORDER — AMIODARONE HCL IN DEXTROSE 360-4.14 MG/200ML-% IV SOLN
0.5000 mg/min | INTRAVENOUS | Status: DC
  Administered 2012-06-23 – 2012-06-26 (×5): 0.5 mg/min via INTRAVENOUS
  Administered 2012-06-26: 150 mg/min via INTRAVENOUS
  Administered 2012-06-26 (×2): 0.5 mg/min via INTRAVENOUS
  Filled 2012-06-23 (×17): qty 200

## 2012-06-23 MED ORDER — POTASSIUM CHLORIDE 10 MEQ/100ML IV SOLN
10.0000 meq | INTRAVENOUS | Status: AC
  Administered 2012-06-23 (×4): 10 meq via INTRAVENOUS
  Filled 2012-06-23 (×4): qty 100

## 2012-06-23 MED ORDER — CHLORHEXIDINE GLUCONATE 0.12 % MT SOLN
15.0000 mL | Freq: Two times a day (BID) | OROMUCOSAL | Status: DC
  Administered 2012-06-23 – 2012-06-24 (×4): 15 mL via OROMUCOSAL
  Filled 2012-06-23 (×4): qty 15

## 2012-06-23 MED ORDER — AMIODARONE IV BOLUS ONLY 150 MG/100ML
150.0000 mg | Freq: Once | INTRAVENOUS | Status: AC
  Administered 2012-06-23: 150 mg via INTRAVENOUS
  Filled 2012-06-23: qty 100

## 2012-06-23 MED ORDER — AMIODARONE HCL 200 MG PO TABS
200.0000 mg | ORAL_TABLET | Freq: Every day | ORAL | Status: DC
  Filled 2012-06-23 (×2): qty 1

## 2012-06-23 MED ORDER — POTASSIUM CHLORIDE CRYS ER 20 MEQ PO TBCR: 40.0000 meq | EXTENDED_RELEASE_TABLET | Freq: Once | ORAL | Status: DC

## 2012-06-23 MED ORDER — ESMOLOL HCL-SODIUM CHLORIDE 2000 MG/100ML IV SOLN
25.0000 ug/kg/min | INTRAVENOUS | Status: DC
  Administered 2012-06-23: 50 ug/kg/min via INTRAVENOUS
  Administered 2012-06-23: 25 ug/kg/min via INTRAVENOUS
  Administered 2012-06-24: 100 ug/kg/min via INTRAVENOUS
  Administered 2012-06-24: 150 ug/kg/min via INTRAVENOUS
  Administered 2012-06-24: 75 ug/kg/min via INTRAVENOUS
  Filled 2012-06-23 (×9): qty 100

## 2012-06-23 MED ORDER — LABETALOL HCL 5 MG/ML IV SOLN
10.0000 mg | Freq: Once | INTRAVENOUS | Status: AC
  Administered 2012-06-23: 10 mg via INTRAVENOUS

## 2012-06-23 MED ORDER — ASPIRIN 300 MG RE SUPP
300.0000 mg | Freq: Once | RECTAL | Status: AC
  Administered 2012-06-23: 300 mg via RECTAL
  Filled 2012-06-23: qty 1

## 2012-06-23 MED ORDER — FUROSEMIDE 10 MG/ML IJ SOLN
40.0000 mg | Freq: Two times a day (BID) | INTRAMUSCULAR | Status: AC
  Administered 2012-06-23 – 2012-06-24 (×4): 40 mg via INTRAVENOUS
  Filled 2012-06-23 (×5): qty 4

## 2012-06-23 NOTE — Progress Notes (Signed)
  Amiodarone Drug - Drug Interaction Consult Note  Recommendations: -Watch potassium trend (K runs have been ordered by MD) -No medication changes are suggested  Amiodarone is metabolized by the cytochrome P450 system and therefore has the potential to cause many drug interactions. Amiodarone has an average plasma half-life of 50 days (range 20 to 100 days).   There is potential for drug interactions to occur several weeks or months after stopping treatment and the onset of drug interactions may be slow after initiating amiodarone.   [x]  Statins: Increased risk of myopathy. Simvastatin- restrict dose to 20mg  daily. Other statins: counsel patients to report any muscle pain or weakness immediately.  []  Anticoagulants: Amiodarone can increase anticoagulant effect. Consider warfarin dose reduction. Patients should be monitored closely and the dose of anticoagulant altered accordingly, remembering that amiodarone levels take several weeks to stabilize.  []  Antiepileptics: Amiodarone can increase plasma concentration of phenytoin, phenytoin dose should be reduced. Note that small changes in phenytoin dose can result in large changes in phenytoin levels. Monitor patient closely and counsel on signs of toxicity.  [x]  Beta blockers: increased risk of bradycardia, AV block and myocardial depression. Sotalol - avoid concomitant use.  []   Calcium channel blockers (diltiazem and verapamil): increased risk of bradycardia, AV block and myocardial depression.  []   Cyclosporine: Amiodarone increases levels of cyclosporine. Reduced dose of cyclosporine is recommended.  []  Digoxin dose should be halved when amiodarone is started.  [x]  Diuretics: increased risk of cardiotoxicity if hypokalemia occurs.   -K= 3.4: this am and potassium runs have been given  []  Oral hypoglycemic agents (glyburide, glipizide, glimepiride): increased risk of hypoglycemia. Patient's glucose levels should be monitored closely when  initiating amiodarone therapy.   []  Drugs that prolong the QT interval: Concurrent therapy is contraindicated due to the increased risk of torsades de pointes; . Antibiotics: e.g. fluoroquinolones, erythromycin. . Antiarrhythmics: e.g. quinidine, procainamide, disopyramide, sotalol. . Antipsychotics: e.g. phenothiazines, haloperidol.  . Lithium, tricyclic antidepressants, and methadone.  Thank You,   Harland German, Pharm D 06/23/2012 3:33 PM

## 2012-06-23 NOTE — Consult Note (Signed)
CARDIOLOGY CONSULT NOTE  Patient ID: Phillip Chandler, MRN: [id number], DOB/AGE: 1930-06-07 79 y.o. Admit date: 06/20/2012 Date of Consult: 06/23/2012  Primary Physician: Lester Fauquier, MD Primary Cardiologist: Dr. Wille Glaser at [facility]) in [place]   Reason for Consultation: A.Fib  HPI: 79 y.o. male w/ PMHx significant for A.Fib (on Amio, no anticoag), Unspecified CHF, HTN, CVA, DMII, and Obesity who was admitted to [facility] on 06/20/2012 for planned fusion of L4-5, L5-S1 and subsequently has developed confusion and  A.Fib w/ RVR.  Patient is followed by Dr. Wille Glaser with [facility]. Per his family he was diagnosed with A.Fib about 3 yrs ago and at that time was placed on coumadin with plans for DCCV. He spontaneously converted and never required DCCV and has not had a documented recurrence before today. His coumadin was stopped due to  "his blood getting too thin and bruising a lot". Has not been on anticoagulation since that time. Denies h/o GI bleed. Per Anesthesia notes he had a low risk stress 05/2012 as part of his preop evaluation.   He underwent surgery on 1/20 without complications. On 1/21 he was noted to be oliguric and have hypotension presumably from hypovolemia. He was given IVF and Tekturna was stopped. On 1/22 he developed confusion that was felt to be delirium from surgery vs medications. On 1/23 he became hypertensive, spiked a fever (101.7), and converted to A.Fib w/ RVR. His mental status is improving, but not back to baseline. He was started on an esmolol drip and given amiodarone bolus. Rates are now 100-110s. BP stable. Respiratory status stable on [place].    Past Medical History  Diagnosis Date  . Complication of anesthesia   . PONV (postoperative nausea and vomiting)   . Hypertension   . Dysrhythmia     afib hx of  . CHF (congestive heart failure)   . Pneumonia     10/2011  . Anxiety   . Diverticula of colon   . Stroke  2005  . GERD (gastroesophageal reflux disease)   . Cancer     basal and squamous cell face  . Arthritis   . Diabetes mellitus without complication      Nuclear stress test -05/23/12  Marked resting HTN (200/103 to 204/89), technically suboptimal images as expected due to patient's size, but no reversible segmental defects identified, borderline LVEF of 49% during marked HTN  EKG 05/23/12  NSR poor anterior r wave progression  Surgical History:  Past Surgical History  Procedure Date  . Joint replacement     Bil Knee  . Hernia repair     Umbicilal-2011  . Eye surgery     Cartaract bil  . Shoulder fusion   . Back surgery 1991     Home Meds: Medication Sig  aliskiren (TEKTURNA) 300 MG tablet Take 300 mg by mouth daily.  amiodarone (PACERONE) 200 MG tablet Take 200 mg by mouth daily.  cetirizine (ZYRTEC) 10 MG tablet Take 10 mg by mouth daily.  cloNIDine (CATAPRES) 0.1 MG tablet Take 0.1 mg by mouth 2 (two) times daily.  fluticasone (FLONASE) 50 MCG/ACT nasal spray Place 2 sprays into the nose daily.  furosemide (LASIX) 20 MG tablet Take 20 mg by mouth 2 (two) times daily.  HYDROcodone-acetaminophen (NORCO) 7.5-325 MG per tablet Take 1 tablet by mouth every 4 (four) hours as needed. For pain  labetalol (NORMODYNE) 200 MG tablet Take 200 mg by mouth 2 (two) times daily.  mesalamine (LIALDA) 1.2 G  EC tablet Take 1,200 mg by mouth daily with breakfast.  metFORMIN (GLUMETZA) 500 MG (MOD) 24 hr tablet Take 500 mg by mouth daily with breakfast.  omeprazole (PRILOSEC) 40 MG capsule Take 40 mg by mouth daily.  pregabalin (LYRICA) 100 MG capsule Take 100 mg by mouth 4 (four) times daily.  simvastatin (ZOCOR) 40 MG tablet Take 40 mg by mouth every evening.    Inpatient Medications:   . amiodarone  200 mg Oral Daily  . antiseptic oral rinse  15 mL Mouth Rinse q12n4p  . atorvastatin  20 mg Oral q1800  . chlorhexidine  15 mL Mouth Rinse BID  . cloNIDine  0.1 mg Oral BID  . docusate  sodium  100 mg Oral BID  . fluticasone  2 spray Each Nare Daily  . hydrALAZINE  20 mg Intravenous Q6H  . insulin aspart  0-9 Units Subcutaneous TID WC  . mesalamine  1,200 mg Oral Q breakfast  . pantoprazole  40 mg Oral Daily  . pregabalin  100 mg Oral QID  . sodium chloride  3 mL Intravenous Q12H   . sodium chloride    . sodium chloride 50 mL/hr at 06/23/12 0900  . esmolol 25 mcg/kg/min (06/23/12 1108)    Allergies:  Allergies  Allergen Reactions  . Fentanyl Other (See Comments)    Personality change- cried a lot.  . Versed (Midazolam) Other (See Comments)    Can in personality.  . Altace (Ramipril)     Shortness of breath  . Sulfa Antibiotics     hives    History   Social History  . Marital Status: Married    Spouse Name: N/A    Number of Children: N/A  . Years of Education: N/A   Occupational History  . Not on file.   Social History Main Topics  . Smoking status: Former Smoker -- 8 years  . Smokeless tobacco: Not on file     Comment: quit in the 50's  . Alcohol Use: No  . Drug Use: No  . Sexually Active: Not on file   Other Topics Concern  . Not on file   Social History Narrative  . No narrative on file     History reviewed. No pertinent family history.   Review of Systems: Unable to obtain due to patient confusion.  Labs:  Component Value Date   WBC 17.1* 06/23/2012   HGB 10.3* 06/23/2012   HCT 31.8* 06/23/2012   MCV 89.6 06/23/2012   PLT 218 06/23/2012    Lab 06/23/12 0435  NA 143  K 3.4*  CL 104  CO2 27  BUN 14  CREATININE 0.72  CALCIUM 9.1  GLUCOSE 159*     06/23/2012 08:00  pH, Arterial 7.520 (H)  pCO2 arterial 32.1 (L)  pO2, Arterial 73.0 (L)  Bicarbonate 26.1 (H)  TCO2 27.1  Acid-Base Excess 3.2 (H)    Radiology/Studies:   06/23/2012  -  PORTABLE CHEST - 1 VIEW   Findings: There is a right-sided internal jugular central venous catheter with tip terminating in the mid superior vena cava. No associated pneumothorax. There is  cephalization of the pulmonary vasculature, indistinctness of the interstitial markings, and patchy airspace disease throughout the lungs bilaterally suggestive of moderate pulmonary edema.  Lung volumes are low.  Probable small left pleural effusion.  Bibasilar subsegmental atelectasis (left greater than right).  Mild cardiomegaly. The patient is rotated to the left on today's exam, resulting in distortion of the mediastinal contours and reduced diagnostic  sensitivity and specificity for mediastinal pathology.  Atherosclerosis in the thoracic aorta  IMPRESSION: 1.  Interval placement of right IJ central venous catheter with tip terminating in the mid superior vena cava. 2.  No pneumothorax. 3.  Worsening moderate edema with small left pleural effusion and bibasilar subsegmental atelectasis. 4.  Mild cardiomegaly is unchanged. 5.  Atherosclerosis.     EKG: 06/21/12 Sinus rhythm 1st degree AVB ( ), 86bpm, LAD, inferior infarct of indeterminate age, poor R wave progression  06/23/12 A.fib 124bpm, LPFB  Physical Exam: Blood pressure 146/81, pulse 127, temperature 101.7 F (38.7 C), temperature source Oral, resp. rate 31, height 5\' 8"  (1.727 m), weight 285 lb 11.5 oz (129.6 kg), SpO2 97.00%. General: obese elderly white male, in no acute distress. Head: Normocephalic, atraumatic, sclera non-icteric, no xanthomas, nares are without discharge.  Neck: Supple. Negative for carotid bruits. JVP 10-12 cm. Lungs: Crackles at bases bilaterally Heart: tachy, irreg with S1 S2. 2/6 systolic murmur LLSB Abdomen: Soft, non-tender, non-distended with normoactive bowel sounds. No hepatomegaly. No rebound/guarding. No obvious abdominal masses. Msk:  Strength and tone appear normal for age. Extremities: No clubbing or cyanosis. 1+ BLE edema.  Distal pedal pulses are intact and equal bilaterally. Neuro: Awake and oriented to person and place. Moves all extremities spontaneously. Psych:  Responds to questions slowly with  a flat affect.   Assessment and Plan:  79 y.o. male w/ PMHx significant for A.Fib (on Amio, no anticoag), Unspecified CHF, HTN, CVA, DMII, and Obesity who was admitted to [facility] on 06/20/2012 for planned fusion of L4-5, L5-S1 and subsequently has developed confusion and  A.Fib w/ RVR.   Signed, Melani Brisbane PA-C 06/23/2012, 11:32 AM   Patient seen with PA, agree with the above note.  1. Atrial fibrillation: Patient is in atrial fibrillation with RVR, started this morning.  He has a history of paroxysmal atrial fibrillation.  He has not been on anticoagulation.  - Agree with acute rate control with esmolol, titrate 25-200 mcg/kg/min to keep HR< 100.  Will transition to Toprol XL eventually.  - No anticoagulation for now given recent spinal surgery.  - Can continue home amiodarone dosing for rate control.   - With resolution of his febrile illness and diuresis, he may come out of atrial fibrillation on his own.  2. CHF: Acute on chronic ?diastolic CHF.  He is volume overloaded on exam with CVP 13 this morning.  EF 49% on myoview prior to procedure.   - Echo - Continue Lasix 40 mg IV bid.  He has responded well to this.   We will follow.  Marca Ancona 06/23/2012 12:19 PM

## 2012-06-23 NOTE — Progress Notes (Addendum)
PULMONARY  / CRITICAL CARE MEDICINE Consult Note   Name: Phillip Chandler MRN: [id number] DOB: January 25, 1931    LOS: 3  REFERRING MD :  Mariam Dollar, MD ( Neurosurgery)  CHIEF COMPLAINT:  Medical management  BRIEF PATIENT DESCRIPTION: 79 yo with DM, HTN, CHF , CVA , GERD, Anxiety, Arthritis with severe lumbar spinal stenosis chair disease and degenerative spondylolisthesis at L4-5 as well as L5-S1 s/p  stabilization procedure at these 2 levels.  PCCM consulted for medical management .   LINES / TUBES: PIV 1-23 lt i j cvl>>  CULTURES: 1-23 bc x 2> 1-23 uc>>  ANTIBIOTICS: Ancef 1/20 x 1  SIGNIFICANT EVENTS:  1/20 Stabilization L4-L5 and L5-S1 procedure   LEVEL OF CARE:  ICU  PRIMARY SERVICE:  Neurosurgery  CONSULTANTS:  PCCM  DIET:  As tolerated  DVT Px:  SCD  GI Px:  Protonix   INTERVAL HISTORY:  1-23 confused, afib new onset . Htn. Called to bedside urgently  VITAL SIGNS: Temp:  [97.4 F (36.3 C)-101.7 F (38.7 C)] 101.7 F (38.7 C) (01/23 0750) Pulse Rate:  [79-129] 129  (01/23 0500) Resp:  [15-30] 30  (01/23 0700) BP: (136-206)/(49-104) 162/78 mmHg (01/23 0700) SpO2:  [90 %-99 %] 97 % (01/23 0500) Weight:  [129.6 kg (285 lb 11.5 oz)] 129.6 kg (285 lb 11.5 oz) (01/23 0400)    INTAKE / OUTPUT: Intake/Output      01/22 0701 - 01/23 0700 01/23 0701 - 01/24 0700   P.O. 240    I.V. (mL/kg) 1323.3 (10.2)    IV Piggyback 100    Total Intake(mL/kg) 1663.3 (12.8)    Urine (mL/kg/hr) 3930 (1.3)    Drains     Total Output 3930    Net -2266.7           PHYSICAL EXAMINATION: General:  Confused, hyperthermic Neuro:  Confused, follows some commands HEENT:  PERRl, EOMI  Cardiovascular:  Afib with rvr 140, bp190/110 Lungs:  Good air movement, no wheezing  Abdomen:  Soft, distended, non-tender,  Musculoskeletal:  Moving all four extremities , 1 + pitting edema present  Skin:  Ecchymosis present on upper extremities   LABS:  Lab 06/23/12 0800 06/23/12 0435  06/22/12 1837 06/22/12 0610 06/21/12 1828 06/21/12 1645 06/21/12 0500 06/20/12 1820 06/20/12 1104  HGB -- 10.3* 10.6* 9.6* -- -- 10.1* 10.2* --  WBC -- 17.1* 17.4* 13.9* -- -- 10.3 11.2* --  PLT -- 218 182 193 -- -- -- -- --  NA -- 143 141 144 141 139 -- -- --  K -- 3.4* 3.7 -- -- -- -- -- --  CL -- 104 104 108 104 102 -- -- --  CO2 -- 27 24 29 27 23  -- -- --  GLUCOSE -- 159* 158* 123* 130* 118* -- -- --  BUN -- 14 13 17 20 21  -- -- --  CREATININE -- 0.72 0.69 1.06 1.29 1.32 -- -- --  CALCIUM -- 9.1 8.9 8.4 8.5 8.4 -- -- --  MG -- -- -- -- -- -- -- -- --  PHOS -- -- -- -- -- -- -- -- --  AST -- -- -- -- -- -- -- -- --  ALT -- -- -- -- -- -- -- -- --  ALKPHOS -- -- -- -- -- -- -- -- --  BILITOT -- -- -- -- -- -- -- -- --  PROT -- -- -- -- -- -- -- -- --  ALBUMIN -- -- -- -- -- -- -- -- --  INR -- -- -- -- -- -- -- -- 0.98  APTT -- -- -- -- -- -- -- -- --  INR -- -- -- -- -- -- -- -- 0.98  LATICACIDVEN -- -- -- -- -- -- -- -- --  TROPONINI -- -- -- -- -- -- -- -- --  PHART 7.520* -- -- -- -- -- -- -- --  PCO2ART 32.1* -- -- -- -- -- -- -- --  PO2ART 73.0* -- -- -- -- -- -- -- --  HCO3 26.1* -- -- -- -- -- -- -- --  O2SAT 96.5 -- -- -- -- -- -- -- --   IMAGING: Dg Chest 1 View  06/22/2012  *RADIOLOGY REPORT*  Clinical Data: Shortness of breath.  CHEST - 1 VIEW  Comparison: 06/20/2012  Findings: Portable views of the chest demonstrates low lung volumes.  Prominent interstitial markings may represent pulmonary edema.  The heart and mediastinum are prominent and likely accentuated by the low lung volumes.  IMPRESSION: Prominent interstitial densities, particularly in the left hilum. Findings are concerning for pulmonary edema.  There are also low lung volumes.   Original Report Authenticated By: Richarda Overlie, M.D.      DIAGNOSES: Active Problems:  Degenerative spondylolisthesis  Diabetes mellitus  HTN (hypertension)  A-fib  CHF (congestive heart failure)  Anxiety  GERD  (gastroesophageal reflux disease)  ASSESSMENT / PLAN:  PULMONARY A: r/o aspiration vs chf from rvr P:   Goal SpO2>92 Supplemental oxygen abg review pcxr now after lone cvp See renal / ID  CARDIOVASCULAR A: A-fib without RVR and HTN.   Hx of CHF, pcxr concerning for pulm edema / chf htn P:  Continue Aliskiren , Amiodaore, Clonidine, Lasix , dc  Labetolol ,  add esmolol 1-23 Cards consult , leBuaer to see 1-23 1000a, consider amiodarone infusion, then esmolol taper if able tekturna held May need lasix, assess cvp  RENAL A: oliguria, pcxr conern edema and resp alk, mod distress P:   Lasix Chem in am  reaplce K  Consider kvo, await cvp  GASTROINTESTINAL A:   UC/Crohns disease? P:   Continue Mesalamine  Npo ppi lft  HEMATOLOGIC A:  Anemia - unclear baseline  P:  Trend CBC scd No hep post op  ENDOCRINE A:  DM. hypokalemia P:   SSI  Replete K+ check mg/phos  NEUROLOGIC A:  S/p Redo decompressive lumbar laminectomy L4-5 in excess will be needed with a standard interbody fusion. P:   Per Neurosurgery  Treat metabolic stress  Infectious dz A: 1-23 fever spike with recent surgery, r/o aspiration, pcxr could be c/w rt side predom P: pan culture, sputum, zosyn  Brett Canales Minor ACNP [facility] PCCM Pager [phone] till 3 pm If no answer page [phone] 06/23/2012, 8:54 AM Ccm time 35 min  I have fully examined this patient and agree with above findings.    And edited in full  Mcarthur Rossetti. [name], MD, FACP Pgr: [phone] [facility]

## 2012-06-23 NOTE — Progress Notes (Signed)
eLink Physician-Brief Progress Note Patient Name: Phillip Chandler DOB: 10-Jun-1930 MRN: [id number]  Date of Service  06/23/2012   HPI/Events of Note   Pos trops  eICU Interventions  ASA 81 daily Ct esmolol   Intervention Category Intermediate Interventions: Diagnostic test evaluation  Baleigh Rennaker V. 06/23/2012, 4:02 PM

## 2012-06-23 NOTE — Progress Notes (Signed)
eLink Physician-Brief Progress Note Patient Name: Phillip Chandler DOB: 06-19-1930 MRN: [id number]  Date of Service  06/23/2012   HPI/Events of Note  Hypokalemia  eICU Interventions  Potassium replaced   Intervention Category Minor Interventions: Electrolytes abnormality - evaluation and management  Fender Herder 06/23/2012, 5:50 AM

## 2012-06-23 NOTE — Progress Notes (Signed)
ANTIBIOTIC CONSULT NOTE - INITIAL  Pharmacy Consult for zosyn Indication: pneumonia  Allergies  Allergen Reactions  . Fentanyl Other (See Comments)    Personality change- cried a lot.  . Versed (Midazolam) Other (See Comments)    Can in personality.  . Altace (Ramipril)     Shortness of breath  . Sulfa Antibiotics     hives    Patient Measurements: Height: 5\' 8"  (172.7 cm) Weight: 285 lb 11.5 oz (129.6 kg) IBW/kg (Calculated) : 68.4   Vital Signs: Temp: 101.7 F (38.7 C) (01/23 0750) Temp src: Oral (01/23 0750) BP: 146/81 mmHg (01/23 0900) Pulse Rate: 127  (01/23 0900) Intake/Output from previous day: 01/22 0701 - 01/23 0700 In: 1663.3 [P.O.:240; I.V.:1323.3; IV Piggyback:100] Out: 3930 [Urine:3930] Intake/Output from this shift: Total I/O In: 300 [I.V.:100; IV Piggyback:200] Out: -   Labs:  Basename 06/23/12 0435 06/22/12 1837 06/22/12 0610 06/21/12 1641  WBC 17.1* 17.4* 13.9* --  HGB 10.3* 10.6* 9.6* --  PLT 218 182 193 --  LABCREA -- -- -- 231.41  CREATININE 0.72 0.69 1.06 --   Estimated Creatinine Clearance: 98.4 ml/min (by C-G formula based on Cr of 0.72). No results found for this basename: VANCOTROUGH:2,VANCOPEAK:2,VANCORANDOM:2,GENTTROUGH:2,GENTPEAK:2,GENTRANDOM:2,TOBRATROUGH:2,TOBRAPEAK:2,TOBRARND:2,AMIKACINPEAK:2,AMIKACINTROU:2,AMIKACIN:2, in the last 72 hours   Microbiology: Recent Results (from the past 720 hour(s))  SURGICAL PCR SCREEN     Status: Normal   Collection Time   06/10/12  4:34 PM      Component Value Range Status Comment   MRSA, PCR NEGATIVE  NEGATIVE Final    Staphylococcus aureus NEGATIVE  NEGATIVE Final     Medical History: Past Medical History  Diagnosis Date  . Complication of anesthesia   . PONV (postoperative nausea and vomiting)   . Hypertension   . Dysrhythmia     afib hx of  . CHF (congestive heart failure)   . Pneumonia     10/2011  . Anxiety   . Diverticula of colon   . Stroke 2005  . GERD (gastroesophageal  reflux disease)   . Cancer     basal and squamous cell face  . Arthritis   . Diabetes mellitus without complication     Medications:  Prescriptions prior to admission  Medication Sig Dispense Refill  . aliskiren (TEKTURNA) 300 MG tablet Take 300 mg by mouth daily.      Marland Kitchen amiodarone (PACERONE) 200 MG tablet Take 200 mg by mouth daily.      . cetirizine (ZYRTEC) 10 MG tablet Take 10 mg by mouth daily.      . cloNIDine (CATAPRES) 0.1 MG tablet Take 0.1 mg by mouth 2 (two) times daily.      . fluticasone (FLONASE) 50 MCG/ACT nasal spray Place 2 sprays into the nose daily.      . furosemide (LASIX) 20 MG tablet Take 20 mg by mouth 2 (two) times daily.      Marland Kitchen HYDROcodone-acetaminophen (NORCO) 7.5-325 MG per tablet Take 1 tablet by mouth every 4 (four) hours as needed. For pain      . labetalol (NORMODYNE) 200 MG tablet Take 200 mg by mouth 2 (two) times daily.      . mesalamine (LIALDA) 1.2 G EC tablet Take 1,200 mg by mouth daily with breakfast.      . metFORMIN (GLUMETZA) 500 MG (MOD) 24 hr tablet Take 500 mg by mouth daily with breakfast.      . omeprazole (PRILOSEC) 40 MG capsule Take 40 mg by mouth daily.      Marland Kitchen  pregabalin (LYRICA) 100 MG capsule Take 100 mg by mouth 4 (four) times daily.      . simvastatin (ZOCOR) 40 MG tablet Take 40 mg by mouth every evening.       Scheduled:    . amiodarone  150 mg Intravenous Once  . antiseptic oral rinse  15 mL Mouth Rinse q12n4p  . atorvastatin  20 mg Oral q1800  . chlorhexidine  15 mL Mouth Rinse BID  . cloNIDine  0.1 mg Oral BID  . docusate sodium  100 mg Oral BID  . fluticasone  2 spray Each Nare Daily  . furosemide  40 mg Intravenous BID  . [COMPLETED] hydrALAZINE      . hydrALAZINE  20 mg Intravenous Q6H  . insulin aspart  0-9 Units Subcutaneous TID WC  . [COMPLETED] labetalol      . [COMPLETED] labetalol  10 mg Intravenous Once  . mesalamine  1,200 mg Oral Q breakfast  . pantoprazole  40 mg Oral Daily  . piperacillin-tazobactam  (ZOSYN)  IV  3.375 g Intravenous Q8H  . [COMPLETED] potassium chloride  10 mEq Intravenous Q1 Hr x 4  . pregabalin  100 mg Oral QID  . sodium chloride  3 mL Intravenous Q12H  . [DISCONTINUED] amiodarone  150 mg Intravenous Once  . [DISCONTINUED] amiodarone  200 mg Oral Daily  . [DISCONTINUED] labetalol  200 mg Oral BID  . [DISCONTINUED] potassium chloride  40 mEq Oral Once   Assessment: 79 yo who was admitted with severe lumbar stenosis and degenerative disc disease. He is s/p redo decompressive laminectomy. Now with new fevers and the suspicion is for PNA. Empiric abx with zosyn  Plan:  Zosyn 3.375g IV q8  [facility] 06/23/2012,12:07 PM

## 2012-06-23 NOTE — Progress Notes (Signed)
FL2 faxed out to area SNF's for search-  Patient prefers [facility] and I have made contact with them for consideration.  Reece Levy, MSW, LCSWA [phone] for Macario Golds, LCSW [phone]

## 2012-06-23 NOTE — Procedures (Signed)
  Central Venous Catheter Insertion Procedure Note Phillip Chandler [id number] June 08, 1930  Procedure: Insertion of Central Venous Catheter Indications: Assessment of intravascular volume, Drug and/or fluid administration and Frequent blood sampling  Procedure Details Consent: Risks of procedure as well as the alternatives and risks of each were explained to the (patient/caregiver).  Consent for procedure obtained. Time Out: Verified patient identification, verified procedure, site/side was marked, verified correct patient position, special equipment/implants available, medications/allergies/relevent history reviewed, required imaging and test results available.  Performed  Maximum sterile technique was used including antiseptics, cap, gloves, gown, hand hygiene, mask and sheet. Skin prep: Chlorhexidine; local anesthetic administered A antimicrobial bonded/coated triple lumen catheter was placed in the right internal jugular vein using the Seldinger technique. Ultrasound guidance used.yes Catheter placed to 17 cm. Blood aspirated via all 3 ports and then flushed x 3. Line sutured x 2 and dressing applied.  Evaluation Blood flow good Complications: No apparent complications Patient did tolerate procedure well. Chest X-ray ordered to verify placement.  CXR: pending. Procedure performed by Bevelyn Ngo, RN ACNP student under direct supervision. Brett Canales Minor ACNP [facility] PCCM Pager [phone] till 3 pm If no answer page [phone] 06/23/2012, 9:45 AM  Cvp, afib rvr meds [place]  Phillip Chandler. [name], MD, FACP Pgr: [phone]  Pulmonary & Critical Care

## 2012-06-23 NOTE — Progress Notes (Addendum)
Clinical Social Work Department CLINICAL SOCIAL WORK PLACEMENT NOTE 06/23/2012  Patient:  Phillip Chandler, Phillip Chandler  Account Number:  [id number] Admit date:  06/20/2012  Clinical Social Worker:  Robin Searing  Date/time:  06/23/2012 02:56 PM  Clinical Social Work is seeking post-discharge placement for this patient at the following level of care:   SKILLED NURSING   (*CSW will update this form in Epic as items are completed)   06/23/2012  Patient/family provided with [facility] Department of Clinical Social Work's list of facilities offering this level of care within the geographic area requested by the patient (or if unable, by the patient's family).  06/23/2012  Patient/family informed of their freedom to choose among providers that offer the needed level of care, that participate in Medicare, Medicaid or managed care program needed by the patient, have an available bed and are willing to accept the patient.  06/23/2012  Patient/family informed of [facility]' ownership interest in [facility], as well as of the fact that they are under no obligation to receive care at this facility.  PASARR submitted to EDS on 06/22/2012 PASARR number received from EDS on 06/22/2012  FL2 transmitted to all facilities in geographic area requested by pt/family on  06/23/2012 FL2 transmitted to all facilities within larger geographic area on   Patient informed that his/her managed care company has contracts with or will negotiate with  certain facilities, including the following:     Patient/family informed of bed offers received:  06/27/2012 (JS) Patient chooses bed at  [facility] pending bed availability (JS) Physician recommends and patient chooses bed at    Patient to be transferred to [facility] on 06/28/2012  (JS) Patient to be transferred to facility by Ambulance (JS)  The following physician request were entered in Epic:   Additional Comments:   Reece Levy, MSW,  Theresia Majors [phone]

## 2012-06-23 NOTE — Progress Notes (Signed)
CCM notified of patient's respiratory status (34)  increased heart rate (150's) and blood pressure (190's sys). Patient is confused and agitated. CCM in to evaluate patient. 12 lead and ABG ordered. 12 lead indicates afib.  Cardiac meds ordered. Central line consent obtained and placed for vasoactive infusions. Dr. Wynetta Emery notified of morning events. Will continue to assess.

## 2012-06-23 NOTE — Progress Notes (Signed)
CRITICAL VALUE ALERT  Critical value received:  troponin  Date of notification:  06/23/2012  Time of notification:  1530  Critical value read back:yes  Nurse who received alert:  Shawnie Dapper  MD notified (1st page):  Dr. Vassie Loll  Time of first page:  1530  MD notified (2nd page):  Time of second page:  Responding MD:  Dr. Vassie Loll  Time MD responded:  [phone]

## 2012-06-23 NOTE — Progress Notes (Signed)
  Echocardiogram 2D Echocardiogram has been performed.  Georgian Co 06/23/2012, 5:54 PM

## 2012-06-23 NOTE — Progress Notes (Signed)
ANTIBIOTIC CONSULT NOTE - FOLLOW UP  Pharmacy Consult for vancomycin Indication: pneumonia  Patient Measurements: Height: 5\' 8"  (172.7 cm) Weight: 285 lb 11.5 oz (129.6 kg) IBW/kg (Calculated) : 68.4   Labs:  Basename 06/23/12 0435 06/22/12 1837 06/22/12 0610 06/21/12 1641  WBC 17.1* 17.4* 13.9* --  HGB 10.3* 10.6* 9.6* --  PLT 218 182 193 --  LABCREA -- -- -- 231.41  CREATININE 0.72 0.69 1.06 --   Estimated Creatinine Clearance: 98.4 ml/min (by C-G formula based on Cr of 0.72).   Assessment: 79 yo male with fever and suspected PNA on zosyn and now adding vancomycin.  Goal of Therapy:  Vancomycin trough level 15-20 mcg/ml  Plan:  -Vancomycin 2000mg  IV followed by 1250mg  IV q12hr -Will follow cultures and renal function  Harland German, Pharm D 06/23/2012 3:11 PM

## 2012-06-23 NOTE — Progress Notes (Signed)
Agree with OT/PT cancellation.  Will attempt this PM if appropriate.  [place], [place] DPT [phone]

## 2012-06-23 NOTE — Progress Notes (Signed)
CCM notified of patient's complaints of chest pain. Vital signs are stable. Troponin ordered and drawn. Will continue to monitor.

## 2012-06-23 NOTE — Progress Notes (Signed)
OT / PT  Cancellation Note  Patient Details Name: Phillip Chandler MRN: [id number] DOB: 06-Jul-1930   Cancelled Treatment:    Reason Eval/Treat Not Completed: Patient not medically ready (delirium; RN AMY requesting to hold for the AM, new onset of AFib)  Lucile Shutters Pager: [phone]  06/23/2012, 9:05 AM

## 2012-06-23 NOTE — Progress Notes (Signed)
Subjective: Patient reports He denies any pain however the patient is more confused this morning he did have an episode where he went into atrial fibrillation with a rapid ventricular response critical-care is been evaluating they working him up placing central line patient will intermittently and spontaneously move his lower 70s well however due to his altered L. status right now secondary to the cardiac dysrhythmia and relative hypoxemia is difficult to get his attention to give me full effort.  Objective: Vital signs in last 24 hours: Temp:  [97.4 F (36.3 C)-101.7 F (38.7 C)] 101.7 F (38.7 C) (01/23 0750) Pulse Rate:  [79-140] 127  (01/23 0900) Resp:  [15-32] 31  (01/23 0900) BP: (136-206)/(49-104) 146/81 mmHg (01/23 0900) SpO2:  [90 %-99 %] 97 % (01/23 0900) Weight:  [129.6 kg (285 lb 11.5 oz)] 129.6 kg (285 lb 11.5 oz) (01/23 0400)  Intake/Output from previous day: 01/22 0701 - 01/23 0700 In: 1663.3 [P.O.:240; I.V.:1323.3; IV Piggyback:100] Out: 3930 [Urine:3930] Intake/Output this shift: Total I/O In: 300 [I.V.:100; IV Piggyback:200] Out: -   secondary to hemodynamic instability at this point. Critical-care is working on managing his respiratory status as well as working up his cardiac dysrhythmia and placing a central line for IV access.  Lab Results:  [facility] 06/23/12 0435 06/22/12 1837  WBC 17.1* 17.4*  HGB 10.3* 10.6*  HCT 31.8* 32.8*  PLT 218 182   BMET  Basename 06/23/12 0435 06/22/12 1837  NA 143 141  K 3.4* 3.7  CL 104 104  CO2 27 24  GLUCOSE 159* 158*  BUN 14 13  CREATININE 0.72 0.69  CALCIUM 9.1 8.9    Studies/Results: Dg Chest 1 View  06/22/2012  *RADIOLOGY REPORT*  Clinical Data: Shortness of breath.  CHEST - 1 VIEW  Comparison: 06/20/2012  Findings: Portable views of the chest demonstrates low lung volumes.  Prominent interstitial markings may represent pulmonary edema.  The heart and mediastinum are prominent and likely accentuated by the low  lung volumes.  IMPRESSION: Prominent interstitial densities, particularly in the left hilum. Findings are concerning for pulmonary edema.  There are also low lung volumes.   Original Report Authenticated By: Richarda Overlie, M.D.     Assessment/Plan: Postop day 3 from L4-5 L5-S1 fusion patient currently in nature fibrillation with a rapid ventricular response being seen by critical care and currently has a central line being placed. Respiratory strata status is tenuous although currently patient is maintaining sats without being intubated. His lumbar wound is dry and he moves his extremities well spontaneously when he's more interactive. We'll continue to workup his cardiac status increases fluids he did have some low urine output however his electrolytes look within normal limits creatinine stable at 0.69 white count however is elevated to 17.4 as of yesterday it and it is slightly lower this morning and 17.1 hematocrit stable at 32 and platelets are 218.  LOS: 3 days     Phillip Chandler P 06/23/2012, 9:30 AM

## 2012-06-24 ENCOUNTER — Inpatient Hospital Stay (HOSPITAL_COMMUNITY): Payer: Medicare Other

## 2012-06-24 LAB — CBC
HCT: 29.2 % — ABNORMAL LOW (ref 39.0–52.0)
Hemoglobin: 9.4 g/dL — ABNORMAL LOW (ref 13.0–17.0)
MCH: 28.7 pg (ref 26.0–34.0)
MCHC: 32.2 g/dL (ref 30.0–36.0)
MCV: 89.3 fL (ref 78.0–100.0)
Platelets: 236 10*3/uL (ref 150–400)
RBC: 3.27 MIL/uL — ABNORMAL LOW (ref 4.22–5.81)
RDW: 14.9 % (ref 11.5–15.5)
WBC: 14.4 10*3/uL — ABNORMAL HIGH (ref 4.0–10.5)

## 2012-06-24 LAB — BASIC METABOLIC PANEL
BUN: 20 mg/dL (ref 6–23)
CO2: 29 mEq/L (ref 19–32)
Calcium: 8.8 mg/dL (ref 8.4–10.5)
Chloride: 103 mEq/L (ref 96–112)
Creatinine, Ser: 0.83 mg/dL (ref 0.50–1.35)
GFR calc Af Amer: >90 mL/min (ref >90)
GFR calc non Af Amer: 82 mL/min — ABNORMAL LOW (ref >90)
Glucose, Bld: 162 mg/dL — ABNORMAL HIGH (ref 70–99)
Potassium: 2.7 mEq/L — CL (ref 3.5–5.1)
Sodium: 143 mEq/L (ref 135–145)

## 2012-06-24 LAB — GLUCOSE, CAPILLARY
Glucose-Capillary: 159 mg/dL — ABNORMAL HIGH (ref 70–99)
Glucose-Capillary: 140 mg/dL — ABNORMAL HIGH (ref 70–99)
Glucose-Capillary: 167 mg/dL — ABNORMAL HIGH (ref 70–99)

## 2012-06-24 LAB — PHOSPHORUS: Phosphorus: 1.2 mg/dL — ABNORMAL LOW (ref 2.3–4.6)

## 2012-06-24 LAB — MAGNESIUM: Magnesium: 2.1 mg/dL (ref 1.5–2.5)

## 2012-06-24 LAB — TROPONIN I
Troponin I: 0.37 ng/mL (ref <0.30)
Troponin I: 0.37 ng/mL (ref <0.30)

## 2012-06-24 MED ORDER — CLONIDINE HCL 0.3 MG/24HR TD PTWK
0.3000 mg | MEDICATED_PATCH | TRANSDERMAL | Status: DC
  Administered 2012-06-24: 0.3 mg via TRANSDERMAL
  Filled 2012-06-24: qty 1

## 2012-06-24 MED ORDER — ENOXAPARIN SODIUM 40 MG/0.4ML ~~LOC~~ SOLN
40.0000 mg | SUBCUTANEOUS | Status: DC
  Administered 2012-06-24 – 2012-06-27 (×4): 40 mg via SUBCUTANEOUS
  Filled 2012-06-24 (×5): qty 0.4

## 2012-06-24 MED ORDER — POTASSIUM CHLORIDE CRYS ER 20 MEQ PO TBCR: 40.0000 meq | EXTENDED_RELEASE_TABLET | ORAL | Status: DC

## 2012-06-24 MED ORDER — HYDRALAZINE HCL 20 MG/ML IJ SOLN
10.0000 mg | INTRAMUSCULAR | Status: DC | PRN
  Administered 2012-06-24: 23:00:00 via INTRAVENOUS
  Administered 2012-06-24 – 2012-06-25 (×5): 10 mg via INTRAVENOUS
  Filled 2012-06-24 (×4): qty 1

## 2012-06-24 MED ORDER — POTASSIUM CHLORIDE 10 MEQ/50ML IV SOLN
10.0000 meq | INTRAVENOUS | Status: AC
  Administered 2012-06-24 (×6): 10 meq via INTRAVENOUS
  Filled 2012-06-24 (×4): qty 50
  Filled 2012-06-24: qty 100

## 2012-06-24 MED ORDER — FENTANYL CITRATE 0.05 MG/ML IJ SOLN
INTRAMUSCULAR | Status: AC
  Filled 2012-06-24: qty 2

## 2012-06-24 MED ORDER — METOPROLOL TARTRATE 1 MG/ML IV SOLN
5.0000 mg | INTRAVENOUS | Status: DC
  Administered 2012-06-24 – 2012-06-25 (×6): 5 mg via INTRAVENOUS
  Filled 2012-06-24 (×12): qty 5

## 2012-06-24 MED ORDER — LABETALOL HCL 5 MG/ML IV SOLN
10.0000 mg | INTRAVENOUS | Status: DC | PRN
  Administered 2012-06-24 – 2012-06-25 (×5): 10 mg via INTRAVENOUS
  Filled 2012-06-24 (×5): qty 4

## 2012-06-24 MED ORDER — FENTANYL CITRATE 0.05 MG/ML IJ SOLN
25.0000 ug | INTRAMUSCULAR | Status: DC | PRN
  Administered 2012-06-24: 03:00:00 via INTRAVENOUS

## 2012-06-24 MED ORDER — HYDROCODONE-ACETAMINOPHEN 7.5-325 MG/15ML PO SOLN
10.0000 mL | ORAL | Status: DC | PRN
  Administered 2012-06-24: 10 mL via ORAL
  Administered 2012-06-25: 06:00:00 via ORAL
  Administered 2012-06-25: 10 mL via ORAL
  Filled 2012-06-24 (×3): qty 15

## 2012-06-24 NOTE — Progress Notes (Signed)
   Subjective:  Patient will not open eyes but denies CP or dyspnea; lethargic   Objective:  Filed Vitals:   06/24/12 0530 06/24/12 0600 06/24/12 0615 06/24/12 0700  BP: 209/84 186/136 207/83 189/77  Pulse:      Temp:      TempSrc:      Resp: 27 23 28 29   Height:      Weight:      SpO2:        Intake/Output from previous day:  Intake/Output Summary (Last 24 hours) at 06/24/12 0718 Last data filed at 06/24/12 1610  Gross per 24 hour  Intake 1871.7 ml  Output   2380 ml  Net -508.3 ml    Physical Exam: Physical exam: Well-developed obese in no acute distress.  Skin is warm and dry.  HEENT is normal.  Neck is supple.  Chest is clear to auscultation anteriorly Cardiovascular exam is regular rate and rhythm.  Abdominal exam nontender or distended. No masses palpated. Extremities show trace edema. neuro grossly intact    Lab Results: Basic Metabolic Panel:  Basename 06/24/12 0400 06/23/12 0435  NA 143 143  K 2.7* 3.4*  CL 103 104  CO2 29 27  GLUCOSE 162* 159*  BUN 20 14  CREATININE 0.83 0.72  CALCIUM 8.8 9.1  MG 2.1 --  PHOS 1.2* --   CBC:  Basename 06/24/12 0400 06/23/12 0435 06/22/12 1837  WBC 14.4* 17.1* --  NEUTROABS -- -- 14.2*  HGB 9.4* 10.3* --  HCT 29.2* 31.8* --  MCV 89.3 89.6 --  PLT 236 218 --   Cardiac Enzymes:  Basename 06/24/12 0400 06/23/12 2051 06/23/12 1510  CKTOTAL -- -- --  CKMB -- -- --  CKMBINDEX -- -- --  TROPONINI 0.37* 0.37* 0.53*     Assessment/Plan:  1 atrial fibrillation-the patient has converted to sinus rhythm. Echocardiogram shows normal LV function. Continue IV amiodarone. Transition back to by mouth once he is tolerating. No anticoagulation at this point given recent surgery. However he has multiple embolic risk factors and this should be reconsidered by his cardiologist as an outpatient. Continue aspirin at discharge. 2 acute diastolic congestive heart failure-the patient remains mildly volume overloaded. His CVP  is 13. Continue Lasix and follow renal function. LV function normal. 3 mild elevation in troponin-there is no clear trend up. Apparently patient had a functional study recently that was low risk. We need to obtain those records. If truly low risk then I would continue medical therapy and have him followup with his primary cardiologist. 4 status post back surgery-management per neurosurgery. 5 hypertension-continue IV medications and transition to by mouth later when tolerating. 6 hypokalemia-supplement  Olga Millers 06/24/2012, 7:18 AM

## 2012-06-24 NOTE — Progress Notes (Signed)
CRITICAL VALUE ALERT  Critical value received:  Potassium  Date of notification:  06/24/12  Time of notification: 0456  Critical value read back:yes  Nurse who received alert:  Rayburn Ma  MD notified (1st page):  Vineet Snood  Time of first page:  0457  MD notified (2nd page):n/a  Time of second page: n/a  Responding MD: Roxy Manns  Time MD responded:  [phone]

## 2012-06-24 NOTE — Progress Notes (Signed)
eLink Physician-Brief Progress Note Patient Name: Phillip Chandler DOB: 03/23/1931 MRN: [id number]  Date of Service  06/24/2012   HPI/Events of Note   Need of dvt prevention  eICU Interventions  Ok with NS Add lovenox, follow crt at age 79   Intervention Category Intermediate Interventions: Best-practice therapies (e.g. DVT, beta blocker, etc.)  Nelda Bucks. 06/24/2012, 4:24 PM

## 2012-06-24 NOTE — Significant Event (Signed)
BMET    Component Value Date/Time   NA 143 06/24/2012 0400   K 2.7* 06/24/2012 0400   CL 103 06/24/2012 0400   CO2 29 06/24/2012 0400   GLUCOSE 162* 06/24/2012 0400   BUN 20 06/24/2012 0400   CREATININE 0.83 06/24/2012 0400   CALCIUM 8.8 06/24/2012 0400   GFRNONAA 82* 06/24/2012 0400   GFRAA >90 06/24/2012 0400     Will replace potassium.  Coralyn Helling, MD 06/24/2012, 4:52 AM

## 2012-06-24 NOTE — Clinical Social Work Note (Signed)
Clinical Social Worker continuing to follow for emotional support and discharge planning needs.  CSW left message for [facility] who has responded considering to bed offer at this time.  Patient remains medically unstable, with request for inpatient rehab consult noted on chart.  CSW to follow up with patient and family to explore the options of inpatient rehab vs. SNF placement at [facility].  CSW remains available for support as needed.  Macario Golds, [place] [phone]

## 2012-06-24 NOTE — Progress Notes (Signed)
I continue to recommend an IP rehab consult/CIR. [phone]

## 2012-06-24 NOTE — Significant Event (Signed)
Pt with elevated blood pressure.  Will give prn labetalol.  Coralyn Helling, MD 06/24/2012, 3:54 AM

## 2012-06-24 NOTE — Progress Notes (Signed)
Physical Therapy Treatment Patient Details Name: Phillip Chandler MRN: [id number] DOB: September 25, 1930 Today's Date: 06/24/2012 Time: 4098-1191 PT Time Calculation (min): 51 min  PT Assessment / Plan / Recommendation Comments on Treatment Session  Pt able to tolerate sitting EOB for 20' after being in bed for 2 days.  Pt more alert once EOB however slow to process at times and needs extra to complete task.  Pt will need lift to get OOB with RN staff.  Pt left in chair position in bed to promote upright sitting.    Follow Up Recommendations  CIR     Equipment Recommendations  None recommended by PT    Recommendations for Other Services Rehab consult  Frequency Min 5X/week   Plan Discharge plan remains appropriate;Frequency remains appropriate    Precautions / Restrictions Precautions Precautions: Back Precaution Comments: unable to recall- due to cognitive deficits currently. speak difficult to understand speech at times Required Braces or Orthoses: Spinal Brace Spinal Brace: Lumbar corset;Applied in sitting position   Pertinent Vitals/Pain BP medication provided prior to mobility  174/76     Mobility  Bed Mobility Bed Mobility: Rolling Left;Left Sidelying to Sit;Supine to Sit;Sitting - Scoot to [place] of Bed;Sit to Supine Rolling Right: 1: +2 Total assist Rolling Right: Patient Percentage: 30% Left Sidelying to Sit: 1: +2 Total assist;HOB elevated Left Sidelying to Sit: Patient Percentage: 20% Supine to Sit: 1: +2 Total assist;HOB elevated;With rails Supine to Sit: Patient Percentage: 20% Sitting - Scoot to Edge of Bed: 1: +2 Total assist;With rail Sitting - Scoot to Edge of Bed: Patient Percentage: 20% Sit to Supine: 1: +2 Total assist;Other (comment);HOB flat;With rail (total+3 ) Sit to Supine: Patient Percentage: 20% Details for Bed Mobility Assistance: Pt required total (A) for LE off EOB and max tactile cue for bed mobility. pt pushing with Rt UE and holding rrail. Pt requried  hand over hand to release bed rail. pt with Lt side weakness and required (A) to maintain upright posture at EOB. pt with Lt Ue supinated and hand over hand to reposition hand safety. Pt LT side weakness > than on eval. Pt sitting at EOB ~20 minutes Transfers Transfers: Not assessed Ambulation/Gait Ambulation/Gait Assistance: Not tested (comment)    Exercises     PT Diagnosis:    PT Problem List:   PT Treatment Interventions:     PT Goals Acute Rehab PT Goals PT Goal Formulation: With patient Time For Goal Achievement: 07/05/12 Potential to Achieve Goals: Good Pt will Roll Supine to Left Side: with supervision PT Goal: Rolling Supine to Left Side - Progress: Not met Pt will go Supine/Side to Sit: with supervision PT Goal: Supine/Side to Sit - Progress: Not met Pt will Sit at [facility] of Bed: with modified independence;1-2 min PT Goal: Sit at Edge Of Bed - Progress: Not met Pt will go Sit to Supine/Side: with min assist PT Goal: Sit to Supine/Side - Progress: Not met  Visit Information  Last PT Received On: 06/24/12 Assistance Needed: +2    Subjective Data  Subjective: "Where's my milk."   Cognition  Overall Cognitive Status: Impaired Area of Impairment: Attention;Memory;Following commands;Problem solving Arousal/Alertness: Awake/alert Orientation Level: Disoriented X4;Time;Place (speech slurred and difficult to completely understand answer) Behavior During Session: Restless Current Attention Level: Sustained Attention - Other Comments: sustained attention ~ 2 minutes to feeding Memory Deficits: no recall of back precautions and needed tactile cue for safety with mobility to maintain back precautions  Following Commands: Follows one step commands inconsistently;Follows one  step commands with increased time    Balance  Static Sitting Balance Static Sitting - Balance Support: Right upper extremity supported;Feet supported Static Sitting - Level of Assistance: 2: Max assist (pt  did progress to Min (A) sitting for ~3 minutes) Static Sitting - Comment/# of Minutes: 20' to maintain balance due to occasional left side lean..  End of Session PT - End of Session Equipment Utilized During Treatment: Gait belt;Back brace Activity Tolerance: Patient limited by fatigue Patient left: in bed;with call bell/phone within reach;with nursing in room;with family/visitor present (left in chair position) Nurse Communication: Mobility status   GP     Teghan Philbin 06/24/2012, 10:31 AM [name], PT DPT [phone]

## 2012-06-24 NOTE — Significant Event (Signed)
BP remains elevated.  Will add prn hydralazine.  Coralyn Helling, MD 06/24/2012, 5:13 AM

## 2012-06-24 NOTE — Progress Notes (Signed)
Subjective: Patient reports Phillip Chandler feels better today still has a fair amount back pain but no headache no significant leg pain  Objective: Vital signs in last 24 hours: Temp:  [99.1 F (37.3 C)-100.2 F (37.9 C)] 99.1 F (37.3 C) (01/24 0400) Pulse Rate:  [80-120] 98  (01/24 1215) Resp:  [17-30] 27  (01/24 1215) BP: (98-240)/(56-153) 175/66 mmHg (01/24 1215) SpO2:  [92 %-100 %] 98 % (01/24 1215) Weight:  [124.4 kg (274 lb 4 oz)] 124.4 kg (274 lb 4 oz) (01/24 0500)  Intake/Output from previous day: 01/23 0701 - 01/24 0700 In: 2016.7 [I.V.:1266.7; IV Piggyback:750] Out: 2380 [Urine:2380] Intake/Output this shift: Total I/O In: 467 [I.V.:217; IV Piggyback:250] Out: 1100 [Urine:1100]  More awake and alert more oriented moves all extremities that is baseline  Lab Results:  Basename 06/24/12 0400 06/23/12 0435  WBC 14.4* 17.1*  HGB 9.4* 10.3*  HCT 29.2* 31.8*  PLT 236 218   BMET  Basename 06/24/12 0400 06/23/12 0435  NA 143 143  K 2.7* 3.4*  CL 103 104  CO2 29 27  GLUCOSE 162* 159*  BUN 20 14  CREATININE 0.83 0.72  CALCIUM 8.8 9.1    Studies/Results: Dg Chest 1 View  06/22/2012  *RADIOLOGY REPORT*  Clinical Data: Shortness of breath.  CHEST - 1 VIEW  Comparison: 06/20/2012  Findings: Portable views of the chest demonstrates low lung volumes.  Prominent interstitial markings may represent pulmonary edema.  The heart and mediastinum are prominent and likely accentuated by the low lung volumes.  IMPRESSION: Prominent interstitial densities, particularly in the left hilum. Findings are concerning for pulmonary edema.  There are also low lung volumes.   Original Report Authenticated By: Richarda Overlie, M.D.    Dg Chest Port 1 View  06/24/2012  *RADIOLOGY REPORT*  Clinical Data: Atelectasis  PORTABLE CHEST - 1 VIEW  Comparison: 06/23/2012; 06/22/2012; 06/20/2012  Findings:  Grossly unchanged enlarged cardiac silhouette and mediastinal contours with atherosclerotic calcifications  within a mildly tortuous and possibly slightly ectatic thoracic aorta.  Stable positioning of support apparatus.  The pulmonary vasculature remains indistinct.  Grossly unchanged right perihilar and left basilar heterogeneous opacities.  No definite pleural effusion, though note, the left costophrenic angle is excluded from view.  No pneumothorax.  Unchanged bones.  IMPRESSION: Grossly unchanged findings of pulmonary edema and bilateral heterogeneous air space opacities, atelectasis versus infiltrate.   Original Report Authenticated By: Tacey Ruiz, MD    Dg Chest Port 1 View  06/23/2012  *RADIOLOGY REPORT*  Clinical Data: Status post central line placement.  PORTABLE CHEST - 1 VIEW  Comparison: Chest x-ray 06/22/2012.  Findings: There is a right-sided internal jugular central venous catheter with tip terminating in the mid superior vena cava. No associated pneumothorax. There is cephalization of the pulmonary vasculature, indistinctness of the interstitial markings, and patchy airspace disease throughout the lungs bilaterally suggestive of moderate pulmonary edema.  Lung volumes are low.  Probable small left pleural effusion.  Bibasilar subsegmental atelectasis (left greater than right).  Mild cardiomegaly. The patient is rotated to the left on today's exam, resulting in distortion of the mediastinal contours and reduced diagnostic sensitivity and specificity for mediastinal pathology.  Atherosclerosis in the thoracic aorta  IMPRESSION: 1.  Interval placement of right IJ central venous catheter with tip terminating in the mid superior vena cava. 2.  No pneumothorax. 3.  Worsening moderate edema with small left pleural effusion and bibasilar subsegmental atelectasis. 4.  Mild cardiomegaly is unchanged. 5.  Atherosclerosis.  Original Report Authenticated By: Trudie Reed, M.D.     Assessment/Plan: Postop day 4 from his L4-5 L5-S1 fusion looks significantly better today appears to be in sinus rhythm  controlled on the amiodarone and esmolol. We'll continue to slowly mobilize in advance his diet slowly mobilize him out of bed per recommendations from both critical care and cardiology. It is okay with me if cardiology or critical-care feel like we need to prophylax him for DVT with Lovenox or subcutaneous heparin at this point.  LOS: 4 days     [name] 06/24/2012, 12:25 PM

## 2012-06-24 NOTE — Significant Event (Signed)
Pt c/o back pain.  Having difficulty swallowing pills.  Will add prn IV fentanyl.  Coralyn Helling, MD 06/24/2012, 2:16 AM

## 2012-06-24 NOTE — Progress Notes (Addendum)
PULMONARY  / CRITICAL CARE MEDICINE Consult Note   Name: Phillip Chandler MRN: [id number] DOB: 1930/07/07    LOS: 4  REFERRING MD :  Mariam Dollar, MD ( Neurosurgery)  CHIEF COMPLAINT:  Medical management  BRIEF PATIENT DESCRIPTION: 79 yo with DM, HTN, CHF , CVA , GERD, Anxiety, Arthritis with severe lumbar spinal stenosis chair disease and degenerative spondylolisthesis at L4-5 as well as L5-S1 s/p  stabilization procedure at these 2 levels.  PCCM consulted for medical management .   LINES / TUBES:  1-23 lt i j cvl>>  CULTURES: 1-23 bc x 2> 1-23 uc>>  ANTIBIOTICS: Ancef 1/20 x 1  SIGNIFICANT EVENTS:  1/20 Stabilization L4-L5 and L5-S1 procedure   LEVEL OF CARE:  ICU  PRIMARY SERVICE:  Neurosurgery  CONSULTANTS:  PCCM  DIET:  As tolerated  DVT Px:  SCD  GI Px:  Protonix   INTERVAL HISTORY:  Less confused. AFib now NSR rate 83  On amiod and esmolol drips VITAL SIGNS: Temp:  [99.1 F (37.3 C)-100.2 F (37.9 C)] 99.1 F (37.3 C) (01/24 0400) Pulse Rate:  [87-134] 87  (01/24 0300) Resp:  [17-36] 29  (01/24 0700) BP: (98-240)/(56-136) 189/77 mmHg (01/24 0700) SpO2:  [92 %-100 %] 98 % (01/24 0300) Weight:  [124.4 kg (274 lb 4 oz)] 124.4 kg (274 lb 4 oz) (01/24 0500)    INTAKE / OUTPUT: Intake/Output      01/23 0701 - 01/24 0700 01/24 0701 - 01/25 0700   P.O.     I.V. (mL/kg) 1266.7 (10.2)    IV Piggyback 750    Total Intake(mL/kg) 2016.7 (16.2)    Urine (mL/kg/hr) 2380 (0.8)    Total Output 2380    Net -363.3           PHYSICAL EXAMINATION: General:  Less confused Neuro:  Confused, follows some commands HEENT:  PERRl, EOMI  Cardiovascular:  HR 82 BP 170s SBP Lungs:  Good air movement, no wheezing , rales Abdomen:  Soft, distended, non-tender,  Musculoskeletal:  Moving all four extremities , 2+ pitting edema present  Skin:  Ecchymosis present on upper extremities   LABS:  Lab 06/24/12 0400 06/23/12 2051 06/23/12 1510 06/23/12 0800 06/23/12 0435  06/22/12 1837 06/22/12 0610 06/21/12 1828 06/21/12 0500 06/20/12 1104  HGB 9.4* -- -- -- 10.3* 10.6* 9.6* -- 10.1* --  WBC 14.4* -- -- -- 17.1* 17.4* 13.9* -- 10.3 --  PLT 236 -- -- -- 218 182 -- -- -- --  NA 143 -- -- -- 143 141 144 141 -- --  K 2.7* -- -- -- 3.4* -- -- -- -- --  CL 103 -- -- -- 104 104 108 104 -- --  CO2 29 -- -- -- 27 24 29 27  -- --  GLUCOSE 162* -- -- -- 159* 158* 123* 130* -- --  BUN 20 -- -- -- 14 13 17 20  -- --  CREATININE 0.83 -- -- -- 0.72 0.69 1.06 1.29 -- --  CALCIUM 8.8 -- -- -- 9.1 8.9 8.4 8.5 -- --  MG 2.1 -- -- -- -- -- -- -- -- --  PHOS 1.2* -- -- -- -- -- -- -- -- --  AST -- -- -- -- 29 -- -- -- -- --  ALT -- -- -- -- 15 -- -- -- -- --  ALKPHOS -- -- -- -- 43 -- -- -- -- --  BILITOT -- -- -- -- 0.6 -- -- -- -- --  PROT -- -- -- --  6.7 -- -- -- -- --  ALBUMIN -- -- -- -- 3.1* -- -- -- -- --  INR -- -- -- -- -- -- -- -- -- 0.98  APTT -- -- -- -- -- -- -- -- -- --  INR -- -- -- -- -- -- -- -- -- 0.98  LATICACIDVEN -- -- -- -- -- -- -- -- -- --  TROPONINI 0.37* 0.37* 0.53* -- -- -- -- -- -- --  PHART -- -- -- 7.520* -- -- -- -- -- --  PCO2ART -- -- -- 32.1* -- -- -- -- -- --  PO2ART -- -- -- 73.0* -- -- -- -- -- --  HCO3 -- -- -- 26.1* -- -- -- -- -- --  O2SAT -- -- -- 96.5 -- -- -- -- -- --   IMAGING: 1/24 CXR: pulm edema, CM   DIAGNOSES: Active Problems:  Degenerative spondylolisthesis  Diabetes mellitus  HTN (hypertension)  Atrial fibrillation with rapid ventricular response  Acute diastolic heart failure  Anxiety  GERD (gastroesophageal reflux disease)  ASSESSMENT / PLAN:  PULMONARY A: r/o aspiration vs chf from rvr, also pulm edeam with acute diast HF P:   Goal SpO2>92 Supplemental oxygen   CARDIOVASCULAR A: A-fib without RVR and HTN. Diastolic hf Acute htn P:  Lasix Change esmolol to IV lopressor Cont amiodarone  RENAL A: oliguria, pcxr conern edema and resp alk, mod distress P:   Lasix Chem in am  reaplce K     GASTROINTESTINAL A:   UC/Crohns disease? P:   Continue Mesalamine  Npo ppi   HEMATOLOGIC A:  Anemia - unclear baseline  P:  Trend CBC scd No hep post op  ENDOCRINE A:  DM. hypokalemia P:   SSI    NEUROLOGIC A:  S/p Redo decompressive lumbar laminectomy L4-5 in excess will be needed with a standard interbody fusion. ?csf leak P:   Per Neurosurgery   Infectious dz A: 1-23 fever spike with recent surgery, r/o aspiration, pcxr could be c/w rt side predom P: cont zosyn/vanco.  F/u c/s W.A.   CC Caryl Bis  [phone]  Cell  [phone]  If no response or cell goes to voicemail, call beeper [phone]  06/24/2012, 9:11 AM

## 2012-06-24 NOTE — Progress Notes (Signed)
Occupational Therapy Treatment Patient Details Name: Phillip Chandler MRN: [id number] DOB: 24-Nov-1930 Today's Date: 06/24/2012 Time: 1308-6578 OT Time Calculation (min): 51 min  OT Assessment / Plan / Recommendation Comments on Treatment Session Pt demonstrates incr arousal with EOB sitting. pt with BP 174/76 . Pt with total+3 staff present for safety at EOB due to cognitive deficits and impulsive behavior. pt pulling at rail and lines.    Follow Up Recommendations  CIR    Barriers to Discharge       Equipment Recommendations  3 in 1 bedside comode;Wheelchair (measurements OT);Wheelchair cushion (measurements OT)    Recommendations for Other Services Rehab consult  Frequency Min 2X/week   Plan Discharge plan remains appropriate    Precautions / Restrictions Precautions Precautions: Back Precaution Comments: unable to recall- due to cognitive deficits currently. speak difficult to understand speech at times Required Braces or Orthoses: Spinal Brace Spinal Brace: Lumbar corset;Applied in sitting position   Pertinent Vitals/Pain BP medication provided prior to mobility 174/76    ADL  Eating/Feeding: Minimal assistance Where Assessed - Eating/Feeding: Edge of bed Grooming: Wash/dry face;Moderate assistance Where Assessed - Grooming: Supported sitting Lower Body Dressing: +2 Total assistance Where Assessed - Lower Body Dressing: Supine, head of bed flat Equipment Used: Back brace;Gait belt Transfers/Ambulation Related to ADLs: Pt completed supine<>sit EOB  ADL Comments: Pt with incr arousal with window binds open, all lights turned on, name call, sternal rub, and tactile cue. Pt bed deflated and opening eyes aroused. Pt able to maintain sustained attention at EOB> Pt prolonged sitting EOB and provided food to self feed with mod v/c. Pt winking randomly during session at therapist to demonstrate attending to questioning. Pt total(A) initially at EOB progressing to Min (A).     OT  Diagnosis:    OT Problem List:   OT Treatment Interventions:     OT Goals Acute Rehab OT Goals OT Goal Formulation: With patient Time For Goal Achievement: 07/05/12 Potential to Achieve Goals: Good ADL Goals Pt Will Perform Grooming: with set-up;Sitting, chair;Supported ADL Goal: Grooming - Progress: Progressing toward goals Pt Will Perform Upper Body Bathing: with set-up;Sitting, chair;Supported Pt Will Perform Upper Body Dressing: with set-up;Sitting, chair;Supported Engineer, water to Toilet: with max assist;with DME;3-in-1 Miscellaneous OT Goals Miscellaneous OT Goal #1: Pt will complete bed mobility Min (A) as precursor to adls with HOB <20 degrees no rail as precursor to adls OT Goal: Miscellaneous Goal #1 - Progress: Progressing toward goals  Visit Information  Last OT Received On: 06/24/12 Assistance Needed: +2    Subjective Data      Prior Functioning       Cognition  Overall Cognitive Status: Impaired Area of Impairment: Attention;Memory;Following commands;Problem solving Arousal/Alertness: Awake/alert Orientation Level: Disoriented X4;Time;Place (speech slurred and difficult to completely understand answer) Behavior During Session: Restless Current Attention Level: Sustained Attention - Other Comments: sustained attention ~ 2 minutes to feeding Memory Deficits: no recall of back precautions and needed tactile cue for safety with mobility to maintain back precautions  Following Commands: Follows one step commands inconsistently;Follows one step commands with increased time    Mobility  Shoulder Instructions Bed Mobility Bed Mobility: Rolling Left;Left Sidelying to Sit;Supine to Sit;Sitting - Scoot to [place] of Bed;Sit to Supine Rolling Right: 1: +2 Total assist Rolling Right: Patient Percentage: 30% Left Sidelying to Sit: 1: +2 Total assist;HOB elevated Left Sidelying to Sit: Patient Percentage: 20% Supine to Sit: 1: +2 Total assist;HOB elevated;With  rails Supine to Sit: Patient Percentage: 20%  Sitting - Scoot to Edge of Bed: 1: +2 Total assist;With rail Sitting - Scoot to Edge of Bed: Patient Percentage: 20% Sit to Supine: 1: +2 Total assist;Other (comment);HOB flat;With rail (total+3 ) Sit to Supine: Patient Percentage: 20% Details for Bed Mobility Assistance: Pt required total (A) for LE off EOB and max tactile cue for bed mobility. pt pushing with Rt UE and holding rrail. Pt requried hand over hand to release bed rail. pt with Lt side weakness and required (A) to maintain upright posture at EOB. pt with Lt Ue supinated and hand over hand to reposition hand safety. Pt LT side weakness > than on eval. Pt sitting at EOB ~20 minutes Transfers Transfers: Not assessed       Exercises      Balance Static Sitting Balance Static Sitting - Balance Support: Right upper extremity supported;Feet supported Static Sitting - Level of Assistance: 2: Max assist (pt did progress to Min (A) sitting for ~3 minutes) Static Sitting - Comment/# of Minutes: 20   End of Session OT - End of Session Activity Tolerance: Patient tolerated treatment well Patient left: in bed;with call bell/phone within reach Nurse Communication: Mobility status;Precautions  GO     Lucile Shutters 06/24/2012, 10:19 AM Pager: [phone]

## 2012-06-25 ENCOUNTER — Inpatient Hospital Stay (HOSPITAL_COMMUNITY): Payer: Medicare Other

## 2012-06-25 DIAGNOSIS — I6350 Cerebral infarction due to unspecified occlusion or stenosis of unspecified cerebral artery: Secondary | ICD-10-CM

## 2012-06-25 LAB — CBC
HCT: 28.2 % — ABNORMAL LOW (ref 39.0–52.0)
Hemoglobin: 9.1 g/dL — ABNORMAL LOW (ref 13.0–17.0)
MCH: 28.9 pg (ref 26.0–34.0)
MCHC: 32.3 g/dL (ref 30.0–36.0)
MCV: 89.5 fL (ref 78.0–100.0)
Platelets: 251 10*3/uL (ref 150–400)
RBC: 3.15 MIL/uL — ABNORMAL LOW (ref 4.22–5.81)
RDW: 15.2 % (ref 11.5–15.5)
WBC: 12.4 10*3/uL — ABNORMAL HIGH (ref 4.0–10.5)

## 2012-06-25 LAB — BASIC METABOLIC PANEL
BUN: 21 mg/dL (ref 6–23)
CO2: 30 mEq/L (ref 19–32)
Calcium: 8.3 mg/dL — ABNORMAL LOW (ref 8.4–10.5)
Chloride: 99 mEq/L (ref 96–112)
Creatinine, Ser: 0.84 mg/dL (ref 0.50–1.35)
GFR calc Af Amer: >90 mL/min (ref >90)
GFR calc non Af Amer: 81 mL/min — ABNORMAL LOW (ref >90)
Glucose, Bld: 147 mg/dL — ABNORMAL HIGH (ref 70–99)
Potassium: 2.5 mEq/L — CL (ref 3.5–5.1)
Sodium: 139 mEq/L (ref 135–145)

## 2012-06-25 LAB — GLUCOSE, CAPILLARY
Glucose-Capillary: 143 mg/dL — ABNORMAL HIGH (ref 70–99)
Glucose-Capillary: 162 mg/dL — ABNORMAL HIGH (ref 70–99)
Glucose-Capillary: 139 mg/dL — ABNORMAL HIGH (ref 70–99)
Glucose-Capillary: 195 mg/dL — ABNORMAL HIGH (ref 70–99)

## 2012-06-25 MED ORDER — FUROSEMIDE 10 MG/ML IJ SOLN
40.0000 mg | Freq: Four times a day (QID) | INTRAMUSCULAR | Status: AC
  Administered 2012-06-25 – 2012-06-26 (×3): 40 mg via INTRAVENOUS
  Filled 2012-06-25 (×2): qty 4

## 2012-06-25 MED ORDER — LABETALOL HCL 200 MG PO TABS
200.0000 mg | ORAL_TABLET | Freq: Two times a day (BID) | ORAL | Status: DC
  Administered 2012-06-25 – 2012-06-26 (×4): 200 mg via ORAL
  Filled 2012-06-25 (×6): qty 1

## 2012-06-25 MED ORDER — BIOTENE DRY MOUTH MT LIQD
15.0000 mL | Freq: Two times a day (BID) | OROMUCOSAL | Status: DC
  Administered 2012-06-26 – 2012-06-28 (×5): 15 mL via OROMUCOSAL

## 2012-06-25 MED ORDER — POTASSIUM CHLORIDE CRYS ER 20 MEQ PO TBCR
40.0000 meq | EXTENDED_RELEASE_TABLET | Freq: Three times a day (TID) | ORAL | Status: DC
  Administered 2012-06-25 – 2012-06-27 (×8): 40 meq via ORAL
  Filled 2012-06-25 (×13): qty 2

## 2012-06-25 MED ORDER — POTASSIUM CHLORIDE 10 MEQ/50ML IV SOLN
10.0000 meq | INTRAVENOUS | Status: AC
  Administered 2012-06-25 (×4): 10 meq via INTRAVENOUS
  Filled 2012-06-25 (×2): qty 50
  Filled 2012-06-25: qty 100

## 2012-06-25 MED ORDER — ALISKIREN FUMARATE 150 MG PO TABS
300.0000 mg | ORAL_TABLET | Freq: Every day | ORAL | Status: DC
  Administered 2012-06-25 – 2012-06-28 (×4): 300 mg via ORAL
  Filled 2012-06-25 (×4): qty 2

## 2012-06-25 MED ORDER — POTASSIUM CHLORIDE 20 MEQ/15ML (10%) PO LIQD
40.0000 meq | Freq: Three times a day (TID) | ORAL | Status: DC
  Filled 2012-06-25 (×2): qty 30

## 2012-06-25 NOTE — Progress Notes (Signed)
Subjective: Patient reports Facilitate no complaints of pain this morning  Objective: Vital signs in last 24 hours: Temp:  [97.8 F (36.6 C)-100.3 F (37.9 C)] 98.4 F (36.9 C) (01/25 0400) Pulse Rate:  [75-98] 77  (01/25 0700) Resp:  [17-28] 25  (01/25 0700) BP: (127-216)/(50-153) 144/50 mmHg (01/25 0700) SpO2:  [91 %-100 %] 98 % (01/25 0700) Weight:  [126.3 kg (278 lb 7.1 oz)] 126.3 kg (278 lb 7.1 oz) (01/25 0500)  Intake/Output from previous day: 01/24 0701 - 01/25 0700 In: 1274.3 [I.V.:724.3; IV Piggyback:550] Out: 2815 [Urine:2815] Intake/Output this shift:    Somnolent but arousable will follow commands neurologically seems to be at his baseline.  Lab Results:  Basename 06/25/12 0500 06/24/12 0400  WBC 12.4* 14.4*  HGB 9.1* 9.4*  HCT 28.2* 29.2*  PLT 251 236   BMET  Basename 06/25/12 0500 06/24/12 0400  NA 139 143  K 2.5* 2.7*  CL 99 103  CO2 30 29  GLUCOSE 147* 162*  BUN 21 20  CREATININE 0.84 0.83  CALCIUM 8.3* 8.8    Studies/Results: Dg Chest Port 1 View  06/24/2012  *RADIOLOGY REPORT*  Clinical Data: Atelectasis  PORTABLE CHEST - 1 VIEW  Comparison: 06/23/2012; 06/22/2012; 06/20/2012  Findings:  Grossly unchanged enlarged cardiac silhouette and mediastinal contours with atherosclerotic calcifications within a mildly tortuous and possibly slightly ectatic thoracic aorta.  Stable positioning of support apparatus.  The pulmonary vasculature remains indistinct.  Grossly unchanged right perihilar and left basilar heterogeneous opacities.  No definite pleural effusion, though note, the left costophrenic angle is excluded from view.  No pneumothorax.  Unchanged bones.  IMPRESSION: Grossly unchanged findings of pulmonary edema and bilateral heterogeneous air space opacities, atelectasis versus infiltrate.   Original Report Authenticated By: Tacey Ruiz, MD    Dg Chest Port 1 View  06/23/2012  *RADIOLOGY REPORT*  Clinical Data: Status post central line placement.   PORTABLE CHEST - 1 VIEW  Comparison: Chest x-ray 06/22/2012.  Findings: There is a right-sided internal jugular central venous catheter with tip terminating in the mid superior vena cava. No associated pneumothorax. There is cephalization of the pulmonary vasculature, indistinctness of the interstitial markings, and patchy airspace disease throughout the lungs bilaterally suggestive of moderate pulmonary edema.  Lung volumes are low.  Probable small left pleural effusion.  Bibasilar subsegmental atelectasis (left greater than right).  Mild cardiomegaly. The patient is rotated to the left on today's exam, resulting in distortion of the mediastinal contours and reduced diagnostic sensitivity and specificity for mediastinal pathology.  Atherosclerosis in the thoracic aorta  IMPRESSION: 1.  Interval placement of right IJ central venous catheter with tip terminating in the mid superior vena cava. 2.  No pneumothorax. 3.  Worsening moderate edema with small left pleural effusion and bibasilar subsegmental atelectasis. 4.  Mild cardiomegaly is unchanged. 5.  Atherosclerosis.   Original Report Authenticated By: Trudie Reed, M.D.     Assessment/Plan: Postop day 5 from his lumbar fusion doing much better now in sinus rhythm with rate 70-80 he remains on IV amiodarone esmolol as well as Zosyn he remains afebrile white count is trending down continue to mobilize with physical outpatient therapy appreciated both critical care and cardiology help.  LOS: 5 days     Violanda Bobeck P 06/25/2012, 8:17 AM

## 2012-06-25 NOTE — Progress Notes (Signed)
CRITICAL VALUE ALERT  Critical value received: Potassium 2.5  Date of notification: 06/25/12  Time of notification: 0617  Critical value read back:yes  Nurse who received alert:  Orvan Seen, RN  MD notified (1st page):  [place]  Time of first page:  0618  MD notified (2nd page):  Time of second page:  Responding MD:  Frederico Hamman  Time MD responded:  [phone]

## 2012-06-25 NOTE — Progress Notes (Signed)
Hypokalemia   K replaced  

## 2012-06-25 NOTE — Progress Notes (Signed)
PULMONARY  / CRITICAL CARE MEDICINE Consult Note   Name: Phillip Chandler MRN: [id number] DOB: 10-Mar-1931    LOS: 5  REFERRING MD :  Mariam Dollar, MD ( Neurosurgery)  CHIEF COMPLAINT:  Medical management  BRIEF PATIENT DESCRIPTION: 79 yo with DM, HTN, CHF , CVA , GERD, Anxiety, Arthritis with severe lumbar spinal stenosis chair disease and degenerative spondylolisthesis at L4-5 as well as L5-S1 s/p  stabilization procedure at these 2 levels.  PCCM consulted for medical management .   LINES / TUBES:  1-23 lt i j cvl>>  CULTURES: 1-23 bc x 2> 1-23 uc>>  ANTIBIOTICS: Ancef 1/20 x 1  SIGNIFICANT EVENTS:  1/20 Stabilization L4-L5 and L5-S1 procedure   LEVEL OF CARE:  ICU  PRIMARY SERVICE:  Neurosurgery  CONSULTANTS:  PCCM  DIET:  As tolerated  DVT Px:  SCD  GI Px:  Protonix   INTERVAL HISTORY:  Less confused. AFib now NSR rate 83  On amiod and esmolol drips VITAL SIGNS: Temp:  [97.8 F (36.6 C)-100.3 F (37.9 C)] 98.1 F (36.7 C) (01/25 1200) Pulse Rate:  [75-94] 80  (01/25 1400) Resp:  [17-27] 27  (01/25 1400) BP: (123-205)/(41-92) 123/41 mmHg (01/25 1400) SpO2:  [91 %-99 %] 96 % (01/25 1400) Weight:  [126.3 kg (278 lb 7.1 oz)] 126.3 kg (278 lb 7.1 oz) (01/25 0500)    INTAKE / OUTPUT: Intake/Output      01/24 0701 - 01/25 0700 01/25 0701 - 01/26 0700   I.V. (mL/kg) 724.3 (5.7) 203.6 (1.6)   IV Piggyback 550 237.5   Total Intake(mL/kg) 1274.3 (10.1) 441.1 (3.5)   Urine (mL/kg/hr) 2815 (0.9) 425 (0.4)   Total Output 2815 425   Net -1540.7 +16.1          PHYSICAL EXAMINATION: General:  Less confused Neuro:  Confused, follows some commands HEENT:  PERRl, EOMI  Cardiovascular:  HR 82 BP 170s SBP Lungs:  Good air movement, no wheezing , rales Abdomen:  Soft, distended, non-tender,  Musculoskeletal:  Moving all four extremities , 2+ pitting edema present  Skin:  Ecchymosis present on upper extremities   LABS:  Lab 06/25/12 0500 06/24/12 0400 06/23/12 2051  06/23/12 1510 06/23/12 0800 06/23/12 0435 06/22/12 1837 06/22/12 0610 06/20/12 1104  HGB 9.1* 9.4* -- -- -- 10.3* 10.6* 9.6* --  WBC 12.4* 14.4* -- -- -- 17.1* 17.4* 13.9* --  PLT 251 236 -- -- -- 218 -- -- --  NA 139 143 -- -- -- 143 141 144 --  K 2.5* 2.7* -- -- -- -- -- -- --  CL 99 103 -- -- -- 104 104 108 --  CO2 30 29 -- -- -- 27 24 29  --  GLUCOSE 147* 162* -- -- -- 159* 158* 123* --  BUN 21 20 -- -- -- 14 13 17  --  CREATININE 0.84 0.83 -- -- -- 0.72 0.69 1.06 --  CALCIUM 8.3* 8.8 -- -- -- 9.1 8.9 8.4 --  MG -- 2.1 -- -- -- -- -- -- --  PHOS -- 1.2* -- -- -- -- -- -- --  AST -- -- -- -- -- 29 -- -- --  ALT -- -- -- -- -- 15 -- -- --  ALKPHOS -- -- -- -- -- 43 -- -- --  BILITOT -- -- -- -- -- 0.6 -- -- --  PROT -- -- -- -- -- 6.7 -- -- --  ALBUMIN -- -- -- -- -- 3.1* -- -- --  INR -- -- -- -- -- -- -- --  0.98  APTT -- -- -- -- -- -- -- -- --  INR -- -- -- -- -- -- -- -- 0.98  LATICACIDVEN -- -- -- -- -- -- -- -- --  TROPONINI -- 0.37* 0.37* 0.53* -- -- -- -- --  PHART -- -- -- -- 7.520* -- -- -- --  PCO2ART -- -- -- -- 32.1* -- -- -- --  PO2ART -- -- -- -- 73.0* -- -- -- --  HCO3 -- -- -- -- 26.1* -- -- -- --  O2SAT -- -- -- -- 96.5 -- -- -- --   IMAGING: 1/24 CXR: pulm edema, CM   DIAGNOSES: Active Problems:  Degenerative spondylolisthesis  Diabetes mellitus  HTN (hypertension)  Atrial fibrillation with rapid ventricular response  Acute diastolic heart failure  Anxiety  GERD (gastroesophageal reflux disease)  ASSESSMENT / PLAN:  PULMONARY A: r/o aspiration vs chf from rvr, also pulm edeam with acute diast HF P:   Goal SpO2>92 Supplemental oxygen Diureses as tolerated.  CARDIOVASCULAR A: A-fib without RVR and HTN. Diastolic hf Acute htn P:  Lasix as ordered Change esmolol to IV lopressor Cont amiodarone  RENAL A: oliguria, pcxr conern edema and resp alk, mod distress P:   Lasix Chem in am  Replace K as needed  GASTROINTESTINAL A:   UC/Crohns  disease? P:   Continue Mesalamine  Carb modified diet. PPI  HEMATOLOGIC A:  Anemia - unclear baseline  P:  Trend CBC. SCD. No hep post op.  ENDOCRINE A:  DM. hypokalemia P:   SSI.  NEUROLOGIC A:  S/p Redo decompressive lumbar laminectomy L4-5 in excess will be needed with a standard interbody fusion. ?csf leak P:   Per Neurosurgery.  Infectious dz A: 1-23 fever spike with recent surgery, r/o aspiration, pcxr could be c/w rt side predom P: cont zosyn/vanco.  F/u c/s W.A.  Alyson Reedy, M.D. [facility] Pulmonary/Critical Care Medicine. Pager: [phone]. After hours pager: [phone].

## 2012-06-25 NOTE — Progress Notes (Signed)
Patient ID: Phillip Chandler, male   DOB: Aug 29, 1930, 79 y.o.   MRN: [id number]   Subjective:  Taking PO no complaints   Objective:  Filed Vitals:   06/25/12 0500 06/25/12 0600 06/25/12 0700 06/25/12 0800  BP: 193/75 170/67 144/50   Pulse: 79 78 77   Temp:    99 F (37.2 C)  TempSrc:    Oral  Resp: 24 24 25    Height:      Weight: 278 lb 7.1 oz (126.3 kg)     SpO2: 99% 97% 98%     Intake/Output from previous day:  Intake/Output Summary (Last 24 hours) at 06/25/12 0958 Last data filed at 06/25/12 0800  Gross per 24 hour  Intake 1064.1 ml  Output   2730 ml  Net -1665.9 ml    Physical Exam: Physical exam: Well-developed obese in no acute distress.  Skin is warm and dry.  HEENT is normal.  Neck is supple.  Chest is clear to auscultation anteriorly Cardiovascular exam is regular rate and rhythm.  Abdominal exam nontender or distended. No masses palpated. Extremities show trace edema. neuro grossly intact    Lab Results: Basic Metabolic Panel:  Basename 06/25/12 0500 06/24/12 0400  NA 139 143  K 2.5* 2.7*  CL 99 103  CO2 30 29  GLUCOSE 147* 162*  BUN 21 20  CREATININE 0.84 0.83  CALCIUM 8.3* 8.8  MG -- 2.1  PHOS -- 1.2*   CBC:  Basename 06/25/12 0500 06/24/12 0400 06/22/12 1837  WBC 12.4* 14.4* --  NEUTROABS -- -- 14.2*  HGB 9.1* 9.4* --  HCT 28.2* 29.2* --  MCV 89.5 89.3 --  PLT 251 236 --   Cardiac Enzymes:  Basename 06/24/12 0400 06/23/12 2051 06/23/12 1510  CKTOTAL -- -- --  CKMB -- -- --  CKMBINDEX -- -- --  TROPONINI 0.37* 0.37* 0.53*     Assessment/Plan:  1 atrial fibrillation-the patient has converted to sinus rhythm. Echocardiogram shows normal LV function. Continue IV amiodarone. Change to PO in am . No anticoagulation at this point given recent surgery. However he has multiple embolic risk factors and this should be reconsidered by his cardiologist as an outpatient. Continue aspirin at discharge. Sees Walmyer at [facility] 2 acute  diastolic congestive heart failure-diresed a liter improved  3 mild elevation in troponin-there is no clear trend up. Apparently patient had a functional study recently that was low risk.  4 status post back surgery-management per neurosurgery. 5 hypertension- started his oral labetolol and tekturna.   6 hypokalemia-supplement  Charlton Haws 06/25/2012, 9:58 AM

## 2012-06-26 LAB — CBC
HCT: 28.6 % — ABNORMAL LOW (ref 39.0–52.0)
Hemoglobin: 9.1 g/dL — ABNORMAL LOW (ref 13.0–17.0)
MCH: 28.7 pg (ref 26.0–34.0)
MCHC: 31.8 g/dL (ref 30.0–36.0)
MCV: 90.2 fL (ref 78.0–100.0)
Platelets: 263 10*3/uL (ref 150–400)
RBC: 3.17 MIL/uL — ABNORMAL LOW (ref 4.22–5.81)
RDW: 14.9 % (ref 11.5–15.5)
WBC: 9.1 10*3/uL (ref 4.0–10.5)

## 2012-06-26 LAB — BASIC METABOLIC PANEL
BUN: 19 mg/dL (ref 6–23)
CO2: 33 mEq/L — ABNORMAL HIGH (ref 19–32)
Calcium: 8 mg/dL — ABNORMAL LOW (ref 8.4–10.5)
Chloride: 100 mEq/L (ref 96–112)
Creatinine, Ser: 0.95 mg/dL (ref 0.50–1.35)
GFR calc Af Amer: 89 mL/min — ABNORMAL LOW (ref >90)
GFR calc non Af Amer: 77 mL/min — ABNORMAL LOW (ref >90)
Glucose, Bld: 127 mg/dL — ABNORMAL HIGH (ref 70–99)
Potassium: 2.7 mEq/L — CL (ref 3.5–5.1)
Sodium: 140 mEq/L (ref 135–145)

## 2012-06-26 LAB — MAGNESIUM: Magnesium: 2 mg/dL (ref 1.5–2.5)

## 2012-06-26 LAB — GLUCOSE, CAPILLARY
Glucose-Capillary: 154 mg/dL — ABNORMAL HIGH (ref 70–99)
Glucose-Capillary: 210 mg/dL — ABNORMAL HIGH (ref 70–99)
Glucose-Capillary: 141 mg/dL — ABNORMAL HIGH (ref 70–99)
Glucose-Capillary: 177 mg/dL — ABNORMAL HIGH (ref 70–99)

## 2012-06-26 LAB — PHOSPHORUS: Phosphorus: 2.7 mg/dL (ref 2.3–4.6)

## 2012-06-26 LAB — VANCOMYCIN, TROUGH: Vancomycin Tr: 13.3 ug/mL (ref 10.0–20.0)

## 2012-06-26 MED ORDER — VANCOMYCIN HCL 10 G IV SOLR
1500.0000 mg | Freq: Two times a day (BID) | INTRAVENOUS | Status: DC
  Administered 2012-06-26 – 2012-06-27 (×2): 1500 mg via INTRAVENOUS
  Filled 2012-06-26 (×3): qty 1500

## 2012-06-26 MED ORDER — POTASSIUM CHLORIDE CRYS ER 20 MEQ PO TBCR
40.0000 meq | EXTENDED_RELEASE_TABLET | Freq: Once | ORAL | Status: AC
  Administered 2012-06-26: 40 meq via ORAL
  Filled 2012-06-26: qty 2

## 2012-06-26 MED ORDER — FUROSEMIDE 10 MG/ML IJ SOLN
40.0000 mg | Freq: Three times a day (TID) | INTRAMUSCULAR | Status: AC
  Administered 2012-06-26 (×2): 40 mg via INTRAVENOUS
  Filled 2012-06-26 (×2): qty 4

## 2012-06-26 MED ORDER — POTASSIUM PHOSPHATE DIBASIC 3 MMOLE/ML IV SOLN
30.0000 mmol | Freq: Once | INTRAVENOUS | Status: AC
  Administered 2012-06-26: 30 mmol via INTRAVENOUS
  Filled 2012-06-26: qty 10

## 2012-06-26 MED ORDER — AMIODARONE IV BOLUS ONLY 150 MG/100ML
150.0000 mg | Freq: Once | INTRAVENOUS | Status: AC
  Administered 2012-06-26: 150 mg via INTRAVENOUS

## 2012-06-26 NOTE — Progress Notes (Signed)
  Patient ID: Phillip Chandler, male   DOB: [birth year], 79 y.o.   MRN: [id number]   Subjective:  Taking PO no complaints Went back into afib about 6:15 am   Objective:  Filed Vitals:   06/26/12 0400 06/26/12 0500 06/26/12 0600 06/26/12 0700  BP: 156/64 154/63 139/62 114/65  Pulse: 73 80 74 92  Temp: 98.5 F (36.9 C)     TempSrc: Oral     Resp: 26 20 22 24   Height:      Weight: 275 lb 5.7 oz (124.9 kg)     SpO2: 96% 98% 93% 95%    Intake/Output from previous day:  Intake/Output Summary (Last 24 hours) at 06/26/12 0801 Last data filed at 06/26/12 0700  Gross per 24 hour  Intake 1701.6 ml  Output   3080 ml  Net -1378.4 ml    Physical Exam: Physical exam: Well-developed obese in no acute distress.  Skin is warm and dry.  HEENT is normal.  Neck is supple.  Chest is clear to auscultation anteriorly Cardiovascular exam is regular rate and rhythm.  Abdominal exam nontender or distended. No masses palpated. Extremities show trace edema. neuro grossly intact    Lab Results: Basic Metabolic Panel:  Basename 06/26/12 0550 06/25/12 0500 06/24/12 0400  NA 140 139 --  K 2.7* 2.5* --  CL 100 99 --  CO2 33* 30 --  GLUCOSE 127* 147* --  BUN 19 21 --  CREATININE 0.95 0.84 --  CALCIUM 8.0* 8.3* --  MG 2.0 -- 2.1  PHOS 2.7 -- 1.2*   CBC:  Basename 06/26/12 0550 06/25/12 0500  WBC 9.1 12.4*  NEUTROABS -- --  HGB 9.1* 9.1*  HCT 28.6* 28.2*  MCV 90.2 89.5  PLT 263 251   Cardiac Enzymes:  Basename 06/24/12 0400 06/23/12 2051 06/23/12 1510  CKTOTAL -- -- --  CKMB -- -- --  CKMBINDEX -- -- --  TROPONINI 0.37* 0.37* 0.53*     Assessment/Plan:  1 atrial fibrillation-recurrent this am  Echocardiogram shows normal LV function. Rebolus with amiodarone and continue iv drip . No anticoagulation at this point given recent surgery. However he has multiple embolic risk factors and this should be reconsidered by his cardiologist as an outpatient. Continue aspirin at discharge.  Sees Walmyer at [facility] 2 acute diastolic congestive heart failure-diresed a liter improved  3 mild elevation in troponin-there is no clear trend up. Apparently patient had a functional study recently that was low risk.  4 status post back surgery-management per neurosurgery. 5 hypertension- started his oral labetolol and tekturna.   6 hypokalemia-supplement  Charlton Haws 06/26/2012, 8:01 AM

## 2012-06-26 NOTE — Progress Notes (Signed)
PULMONARY  / CRITICAL CARE MEDICINE Consult Note   Name: Phillip Chandler MRN: [id number] DOB: 20-Jul-1930    LOS: 6  REFERRING MD :  Mariam Dollar, MD ( Neurosurgery)  CHIEF COMPLAINT:  Medical management  BRIEF PATIENT DESCRIPTION: 79 yo with DM, HTN, CHF , CVA , GERD, Anxiety, Arthritis with severe lumbar spinal stenosis chair disease and degenerative spondylolisthesis at L4-5 as well as L5-S1 s/p  stabilization procedure at these 2 levels.  PCCM consulted for medical management .   LINES / TUBES: 1-23 L IJ CVL>>>  CULTURES: 1-23 bc x 2>>>NTD 1-23 uc>>>NTD  ANTIBIOTICS: Ancef 1/20 x 1 Zosyn 1/24>>> Vanc 1/24>>>  SIGNIFICANT EVENTS:  1/20 Stabilization L4-L5 and L5-S1 procedure   LEVEL OF CARE:  ICU  PRIMARY SERVICE:  Neurosurgery  CONSULTANTS:  PCCM  DIET:  As tolerated  DVT Px:  SCD  GI Px:  Protonix   INTERVAL HISTORY:  Less confused. AFib now NSR rate 83  On amiod and esmolol drips VITAL SIGNS: Temp:  [98 F (36.7 C)-100 F (37.8 C)] 98.5 F (36.9 C) (01/26 0400) Pulse Rate:  [71-94] 92  (01/26 0700) Resp:  [18-27] 24  (01/26 0700) BP: (101-177)/(41-73) 114/65 mmHg (01/26 0700) SpO2:  [93 %-100 %] 95 % (01/26 0700) Weight:  [124.9 kg (275 lb 5.7 oz)] 124.9 kg (275 lb 5.7 oz) (01/26 0400)   INTAKE / OUTPUT: Intake/Output      01/25 0701 - 01/26 0700 01/26 0701 - 01/27 0700   P.O. 300    I.V. (mL/kg) 657.5 (5.3)    IV Piggyback 837.5    Total Intake(mL/kg) 1795 (14.4)    Urine (mL/kg/hr) 3195 (1.1)    Total Output 3195    Net -1400          PHYSICAL EXAMINATION: General:  Less confused Neuro:  Confused, follows some commands HEENT:  PERRl, EOMI  Cardiovascular:  HR 82 BP 170s SBP Lungs:  Good air movement, no wheezing , rales Abdomen:  Soft, distended, non-tender,  Musculoskeletal:  Moving all four extremities , 2+ pitting edema present  Skin:  Ecchymosis present on upper extremities   LABS:  Lab 06/26/12 0550 06/25/12 0500 06/24/12 0400  06/23/12 2051 06/23/12 1510 06/23/12 0800 06/23/12 0435 06/22/12 1837 06/20/12 1104  HGB 9.1* 9.1* 9.4* -- -- -- 10.3* 10.6* --  WBC 9.1 12.4* 14.4* -- -- -- 17.1* 17.4* --  PLT 263 251 236 -- -- -- -- -- --  NA 140 139 143 -- -- -- 143 141 --  K 2.7* 2.5* -- -- -- -- -- -- --  CL 100 99 103 -- -- -- 104 104 --  CO2 33* 30 29 -- -- -- 27 24 --  GLUCOSE 127* 147* 162* -- -- -- 159* 158* --  BUN 19 21 20  -- -- -- 14 13 --  CREATININE 0.95 0.84 0.83 -- -- -- 0.72 0.69 --  CALCIUM 8.0* 8.3* 8.8 -- -- -- 9.1 8.9 --  MG 2.0 -- 2.1 -- -- -- -- -- --  PHOS 2.7 -- 1.2* -- -- -- -- -- --  AST -- -- -- -- -- -- 29 -- --  ALT -- -- -- -- -- -- 15 -- --  ALKPHOS -- -- -- -- -- -- 43 -- --  BILITOT -- -- -- -- -- -- 0.6 -- --  PROT -- -- -- -- -- -- 6.7 -- --  ALBUMIN -- -- -- -- -- -- 3.1* -- --  INR -- -- -- -- -- -- -- -- 0.98  APTT -- -- -- -- -- -- -- -- --  INR -- -- -- -- -- -- -- -- 0.98  LATICACIDVEN -- -- -- -- -- -- -- -- --  TROPONINI -- -- 0.37* 0.37* 0.53* -- -- -- --  PHART -- -- -- -- -- 7.520* -- -- --  PCO2ART -- -- -- -- -- 32.1* -- -- --  PO2ART -- -- -- -- -- 73.0* -- -- --  HCO3 -- -- -- -- -- 26.1* -- -- --  O2SAT -- -- -- -- -- 96.5 -- -- --   IMAGING: 1/24 CXR: pulm edema, CM   DIAGNOSES: Active Problems:  Degenerative spondylolisthesis  Diabetes mellitus  HTN (hypertension)  Atrial fibrillation with rapid ventricular response  Acute diastolic heart failure  Anxiety  GERD (gastroesophageal reflux disease)  ASSESSMENT / PLAN:  PULMONARY A: r/o aspiration vs chf from rvr, also pulm edeam with acute diast HF P:   Goal SpO2>92 Supplemental oxygen Diureses as tolerated.  CARDIOVASCULAR A: A-fib without RVR and HTN. Diastolic hf Acute htn P:  Lasix as ordered. Change esmolol to IV lopressor, will defer further dosing to cards. Cont amiodarone and will change to PO today per cards.  RENAL A: oliguria, pcxr conern edema and resp alk, mod distress P:    Lasix 40 x2 doses. Chem in am. Replace K and Phos as ordered.  GASTROINTESTINAL A:   UC/Crohns disease? P:   Continue Mesalamine. Carb modified diet. PPI.  HEMATOLOGIC A:  Anemia - unclear baseline  P:  Trend CBC. SCD. No hep post op.  ENDOCRINE A:  DM. hypokalemia P:   SSI.  NEUROLOGIC A:  S/p Redo decompressive lumbar laminectomy L4-5 in excess will be needed with a standard interbody fusion. ?csf leak P:   Per Neurosurgery.  Infectious dz A: 1-23 fever spike with recent surgery, r/o aspiration, pcxr could be c/w rt side predom P: Cont zosyn/vanco.  F/u c/s W.A.  Alyson Reedy, M.D. [facility] Pulmonary/Critical Care Medicine. Pager: [phone]. After hours pager: [phone].

## 2012-06-26 NOTE — Progress Notes (Signed)
CRITICAL VALUE ALERT  Critical value received:  K 2.7  Date of notification:  06/26/12  Time of notification:  0647  Critical value read back:yes  Nurse who received alert:  Barbarann Ehlers, RN  MD notified (1st page):  Delford Field  Time of first page:  [phone]  MD notified (2nd page):  Time of second page:  Responding MD:  Delford Field  Time MD responded:  [phone]

## 2012-06-26 NOTE — Progress Notes (Signed)
ANTIBIOTIC CONSULT NOTE - FOLLOW UP  Pharmacy Consult for Vancomycin + Zosyn Indication: rule out pneumonia  Allergies  Allergen Reactions  . Fentanyl Other (See Comments)    Personality change- cried a lot.  . Versed (Midazolam) Other (See Comments)    Can in personality.  . Altace (Ramipril)     Shortness of breath  . Sulfa Antibiotics     hives    Patient Measurements: Height: 5\' 8"  (172.7 cm) Weight: 275 lb 5.7 oz (124.9 kg) IBW/kg (Calculated) : 68.4   Vital Signs: Temp: 99.2 F (37.3 C) (01/26 1638) Temp src: Oral (01/26 1638) BP: 131/67 mmHg (01/26 1100) Pulse Rate: 100  (01/26 1100) Intake/Output from previous day: 01/25 0701 - 01/26 0700 In: 1795 [P.O.:300; I.V.:657.5; IV Piggyback:837.5] Out: 3195 [Urine:3195] Intake/Output from this shift: Total I/O In: 317.5 [I.V.:50; IV Piggyback:267.5] Out: 1290 [Urine:1290]  Labs:  [facility] 06/26/12 0550 06/25/12 0500 06/24/12 0400  WBC 9.1 12.4* 14.4*  HGB 9.1* 9.1* 9.4*  PLT 263 251 236  LABCREA -- -- --  CREATININE 0.95 0.84 0.83   Estimated Creatinine Clearance: 81.2 ml/min (by C-G formula based on Cr of 0.95).  Basename 06/26/12 1620  VANCOTROUGH 13.3  VANCOPEAK --  VANCORANDOM --  GENTTROUGH --  GENTPEAK --  GENTRANDOM --  TOBRATROUGH --  TOBRAPEAK --  TOBRARND --  AMIKACINPEAK --  AMIKACINTROU --  AMIKACIN --     Microbiology: Recent Results (from the past 720 hour(s))  SURGICAL PCR SCREEN     Status: Normal   Collection Time   06/10/12  4:34 PM      Component Value Range Status Comment   MRSA, PCR NEGATIVE  NEGATIVE Final    Staphylococcus aureus NEGATIVE  NEGATIVE Final   CULTURE, BLOOD (ROUTINE X 2)     Status: Normal (Preliminary result)   Collection Time   06/23/12 11:50 AM      Component Value Range Status Comment   Specimen Description BLOOD LEFT ANTECUBITAL   Final    Special Requests BOTTLES DRAWN AEROBIC AND ANAEROBIC 10CC   Final    Culture  Setup Time 06/23/2012 16:27   Final     Culture     Final    Value:        BLOOD CULTURE RECEIVED NO GROWTH TO DATE CULTURE WILL BE HELD FOR 5 DAYS BEFORE ISSUING A FINAL NEGATIVE REPORT   Report Status PENDING   Incomplete   CULTURE, BLOOD (ROUTINE X 2)     Status: Normal (Preliminary result)   Collection Time   06/23/12 11:55 AM      Component Value Range Status Comment   Specimen Description BLOOD LEFT HAND   Final    Special Requests BOTTLES DRAWN AEROBIC AND ANAEROBIC 10CC   Final    Culture  Setup Time 06/23/2012 16:26   Final    Culture     Final    Value:        BLOOD CULTURE RECEIVED NO GROWTH TO DATE CULTURE WILL BE HELD FOR 5 DAYS BEFORE ISSUING A FINAL NEGATIVE REPORT   Report Status PENDING   Incomplete     Anti-infectives     Start     Dose/Rate Route Frequency Ordered Stop   06/24/12 0400   vancomycin (VANCOCIN) 1,250 mg in sodium chloride 0.9 % 250 mL IVPB        1,250 mg 166.7 mL/hr over 90 Minutes Intravenous Every 12 hours 06/23/12 1512     06/23/12 1600  vancomycin (VANCOCIN) 2,000 mg in sodium chloride 0.9 % 500 mL IVPB        2,000 mg 250 mL/hr over 120 Minutes Intravenous  Once 06/23/12 1512 06/23/12 1747   06/23/12 1300  piperacillin-tazobactam (ZOSYN) IVPB 3.375 g       3.375 g 12.5 mL/hr over 240 Minutes Intravenous Every 8 hours 06/23/12 1158     06/21/12 1400   ceFAZolin (ANCEF) IVPB 2 g/50 mL premix        2 g 100 mL/hr over 30 Minutes Intravenous 3 times per day 06/21/12 1034 06/21/12 2233   06/20/12 2100   ceFAZolin (ANCEF) IVPB 1 g/50 mL premix        1 g 100 mL/hr over 30 Minutes Intravenous Every 8 hours 06/20/12 2011 06/21/12 0527   06/20/12 1452   ceFAZolin (ANCEF) 2-3 GM-% IVPB SOLR     Comments: FREEZE, PATRICIA: cabinet override         06/20/12 1452 06/21/12 0259   06/20/12 1234   bacitracin 50,000 Units in sodium chloride irrigation 0.9 % 500 mL irrigation  Status:  Discontinued          As needed 06/20/12 1235 06/20/12 1705   06/20/12 1117   ceFAZolin (ANCEF) 2-3  GM-% IVPB SOLR     Comments: FREEZE, PATRICIA: cabinet override         06/20/12 1117 06/20/12 1545   06/20/12 1045   bacitracin 16109 UNITS injection     Comments: FREEZE, PATRICIA: cabinet override         06/20/12 1045 06/20/12 2244          Assessment: 79 y.o. M on Vancomycin + Zosyn for r/o PNA. A Vancomycin level this afternoon is slightly SUBtherapeutic (VT 13.3, goal of 15-20 mcg/ml). Renal function has remained stable, SCr 0.95, CrCl~60-70 ml/min. Will increase Vancomycin dose slightly to achieve target goal range. Zosyn dose remains appropriate.  Goal of Therapy:  Vancomycin trough level 15-20 mcg/ml  Plan:  1. Increase Vancomycin to 1500 mg IV every 12 hours 2. Continue Zosyn 3.375g IV every 8 hours 3. Will continue to follow renal function, culture results, LOT, and antibiotic de-escalation plans   Georgina Pillion, PharmD, BCPS Clinical Pharmacist Pager: [phone] 06/26/2012 5:20 PM

## 2012-06-26 NOTE — Progress Notes (Signed)
Subjective: Patient reports He has no complaints pain is well controlled breathing is doing okay swelling is doing okay he feels some burning around his catheter  Objective: Vital signs in last 24 hours: Temp:  [98 F (36.7 C)-100 F (37.8 C)] 98.5 F (36.9 C) (01/26 0400) Pulse Rate:  [71-94] 92  (01/26 0700) Resp:  [18-27] 24  (01/26 0700) BP: (101-177)/(41-73) 114/65 mmHg (01/26 0700) SpO2:  [93 %-100 %] 95 % (01/26 0700) Weight:  [124.9 kg (275 lb 5.7 oz)] 124.9 kg (275 lb 5.7 oz) (01/26 0400)  Intake/Output from previous day: 01/25 0701 - 01/26 0700 In: 1795 [P.O.:300; I.V.:657.5; IV Piggyback:837.5] Out: 3195 [Urine:3195] Intake/Output this shift:    Awake alert strength is 4+ out of 5 baseline right lower extremities baseline left foot drop.  Lab Results:  Basename 06/26/12 0550 06/25/12 0500  WBC 9.1 12.4*  HGB 9.1* 9.1*  HCT 28.6* 28.2*  PLT 263 251   BMET  Basename 06/26/12 0550 06/25/12 0500  NA 140 139  K 2.7* 2.5*  CL 100 99  CO2 33* 30  GLUCOSE 127* 147*  BUN 19 21  CREATININE 0.95 0.84  CALCIUM 8.0* 8.3*    Studies/Results: Dg Chest Port 1 View  06/25/2012  *RADIOLOGY REPORT*  Clinical Data: Pulmonary edema.  Acute diastolic heart failure. Atrial fibrillation.  PORTABLE CHEST - 1 VIEW  Comparison: 06/24/2012  Findings: Low lung volumes again seen.  Cardiomegaly stable. Diffuse interstitial edema pattern shows no significant change. Persistent small right pleural effusions seen tracking into the minor fissure.  Right jugular center venous catheter remains in appropriate position.  IMPRESSION: No significant change in diffuse pulmonary edema and small right pleural effusion, consistent with congestive heart failure.   Original Report Authenticated By: Myles Rosenthal, M.D.     Assessment/Plan: Neurologically stable continue progressive mobilization patient denies any headaches there is no evidence of CSF leak vital signs appear to be stable at her lites  appear to be stable except for hypokalemia which is been repleted he remains on amiodarone drip which is [place] heart rate stable at 80 in sinus  LOS: 6 days     Phillip Chandler P 06/26/2012, 7:45 AM

## 2012-06-27 LAB — BASIC METABOLIC PANEL
BUN: 19 mg/dL (ref 6–23)
CO2: 30 mEq/L (ref 19–32)
Calcium: 8.1 mg/dL — ABNORMAL LOW (ref 8.4–10.5)
Chloride: 102 mEq/L (ref 96–112)
Creatinine, Ser: 0.9 mg/dL (ref 0.50–1.35)
GFR calc Af Amer: >90 mL/min (ref >90)
GFR calc non Af Amer: 79 mL/min — ABNORMAL LOW (ref >90)
Glucose, Bld: 135 mg/dL — ABNORMAL HIGH (ref 70–99)
Potassium: 3.6 mEq/L (ref 3.5–5.1)
Sodium: 139 mEq/L (ref 135–145)

## 2012-06-27 LAB — GLUCOSE, CAPILLARY
Glucose-Capillary: 138 mg/dL — ABNORMAL HIGH (ref 70–99)
Glucose-Capillary: 138 mg/dL — ABNORMAL HIGH (ref 70–99)
Glucose-Capillary: 132 mg/dL — ABNORMAL HIGH (ref 70–99)
Glucose-Capillary: 130 mg/dL — ABNORMAL HIGH (ref 70–99)

## 2012-06-27 LAB — CBC
HCT: 29.1 % — ABNORMAL LOW (ref 39.0–52.0)
Hemoglobin: 9.3 g/dL — ABNORMAL LOW (ref 13.0–17.0)
MCH: 28.5 pg (ref 26.0–34.0)
MCHC: 32 g/dL (ref 30.0–36.0)
MCV: 89.3 fL (ref 78.0–100.0)
Platelets: 289 10*3/uL (ref 150–400)
RBC: 3.26 MIL/uL — ABNORMAL LOW (ref 4.22–5.81)
RDW: 14.7 % (ref 11.5–15.5)
WBC: 11.3 10*3/uL — ABNORMAL HIGH (ref 4.0–10.5)

## 2012-06-27 LAB — PHOSPHORUS: Phosphorus: 3.2 mg/dL (ref 2.3–4.6)

## 2012-06-27 LAB — MAGNESIUM: Magnesium: 1.9 mg/dL (ref 1.5–2.5)

## 2012-06-27 MED ORDER — METFORMIN HCL ER 500 MG PO TB24
500.0000 mg | ORAL_TABLET | Freq: Every day | ORAL | Status: DC
Start: 2012-06-28 — End: 2012-06-28
  Administered 2012-06-28: 500 mg via ORAL
  Filled 2012-06-27 (×2): qty 1

## 2012-06-27 MED ORDER — LABETALOL HCL 300 MG PO TABS
300.0000 mg | ORAL_TABLET | Freq: Two times a day (BID) | ORAL | Status: DC
  Administered 2012-06-27 – 2012-06-28 (×3): 300 mg via ORAL
  Filled 2012-06-27 (×4): qty 1

## 2012-06-27 MED ORDER — AMIODARONE HCL 200 MG PO TABS
200.0000 mg | ORAL_TABLET | Freq: Two times a day (BID) | ORAL | Status: DC
  Administered 2012-06-27 – 2012-06-28 (×3): 200 mg via ORAL
  Filled 2012-06-27 (×4): qty 1

## 2012-06-27 NOTE — Progress Notes (Signed)
Inpatient acute rehabilitation has not been ordered or evaluated pt for admission. Please order consult. [phone]

## 2012-06-27 NOTE — Progress Notes (Signed)
Patient not at a level to be able to participate in intense therapy. He will need SNF. Please call with any questions. [phone]

## 2012-06-27 NOTE — Progress Notes (Signed)
Physical Therapy Treatment Patient Details Name: Phillip Chandler MRN: [id number] DOB: 1931/03/16 Today's Date: 06/27/2012 Time: 2952-8413 PT Time Calculation (min): 53 min  PT Assessment / Plan / Recommendation Comments on Treatment Session  Pt more alert this session with improved overall cognition.  However pt with overall generalized weakness especially in LE with transfers.  Introduced LE exercises to improve overall strength.  Pt may benefit from steady vs. sara plus next session due to need of +3 (A) to complete transfer with increase fatigue during session.    Follow Up Recommendations  CIR     Equipment Recommendations  None recommended by PT    Recommendations for Other Services Rehab consult  Frequency Min 5X/week   Plan Discharge plan remains appropriate;Frequency remains appropriate    Precautions / Restrictions Precautions Precautions: Back Precaution Booklet Issued: Yes (comment) Precaution Comments: reeducated pt on 3/3 back precautions Required Braces or Orthoses: Spinal Brace Spinal Brace: Lumbar corset;Applied in sitting position   Pertinent Vitals/Pain 3/10 back pain    Mobility  Bed Mobility Bed Mobility: Rolling Right;Right Sidelying to Sit Rolling Right: 1: +2 Total assist;With rail Rolling Right: Patient Percentage: 40% Right Sidelying to Sit: 1: +2 Total assist;HOB flat;With rails Right Sidelying to Sit: Patient Percentage: 50% Details for Bed Mobility Assistance: (A) needed to initiate and complete roll with max cues for technique to prevent twisting.   Transfers Transfers: Sit to Stand;Stand to Sit;Squat Pivot Transfers Sit to Stand: 1: +2 Total assist;From elevated surface;From bed;From chair/3-in-1 Sit to Stand: Patient Percentage: 20% Stand to Sit: 1: +2 Total assist;To bed;To elevated surface Stand to Sit: Patient Percentage: 20% Squat Pivot Transfers: 1: +2 Total assist Squat Pivot Transfers: Patient Percentage: 10% Details for Transfer  Assistance: Pt able to stand with total (A) +2 pt =50% but unable advance LE towards recliner with use of RW.  Pt then unable to stand completely upright due to increase overall fatigue after several attempts.  Pt then needed +3 (A) to transfer to chair. Ambulation/Gait Ambulation/Gait Assistance: Not tested (comment)    Exercises General Exercises - Lower Extremity Gluteal Sets: Strengthening;5 reps Long Arc Quad: Strengthening;Both;5 reps Hip Flexion/Marching: Strengthening;Both;5 reps   PT Diagnosis:    PT Problem List:   PT Treatment Interventions:     PT Goals Acute Rehab PT Goals PT Goal Formulation: With patient Time For Goal Achievement: 07/05/12 Potential to Achieve Goals: Good Pt will go Supine/Side to Sit: with supervision PT Goal: Supine/Side to Sit - Progress: Progressing toward goal Pt will Sit at Edge of Bed: with modified independence;1-2 min PT Goal: Sit at Edge Of Bed - Progress: Progressing toward goal Pt will go Sit to Stand: with supervision PT Goal: Sit to Stand - Progress: Progressing toward goal Pt will go Stand to Sit: with supervision PT Goal: Stand to Sit - Progress: Progressing toward goal Pt will Transfer Bed to Chair/Chair to Bed: with supervision PT Transfer Goal: Bed to Chair/Chair to Bed - Progress: Progressing toward goal  Visit Information  Last PT Received On: 06/27/12 Assistance Needed: +3 or more (to transfer)    Subjective Data  Subjective: "My legs are just weak." Patient Stated Goal: To go to rehab   Cognition  Overall Cognitive Status: Appears within functional limits for tasks assessed/performed Arousal/Alertness: Awake/alert Orientation Level: Oriented X4 / Intact Behavior During Session: [facility] for tasks performed    Balance  Balance Balance Assessed: Yes Static Sitting Balance Static Sitting - Balance Support: Bilateral upper extremity supported Static Sitting -  Level of Assistance: 4: Min assist Static Sitting - Comment/# of  Minutes: ~15 minutes sitting EOB.  Pt needed initial min (A) to maintain upright posture due to posterior lean.  Pt able to progress to minguard with heavy use of UE support.  End of Session PT - End of Session Equipment Utilized During Treatment: Gait belt;Back brace Activity Tolerance: Patient limited by fatigue Patient left: in chair;with call bell/phone within reach;with nursing in room (lift pad placed underneath) Nurse Communication: Need for lift equipment   GP     Nivea Wojdyla 06/27/2012, 1:45 PM [name], PT DPT [phone]

## 2012-06-27 NOTE — Progress Notes (Signed)
PULMONARY  / CRITICAL CARE MEDICINE   Name: Phillip Chandler MRN: [id number] DOB: Jun 29, 1930    LOS: 7  REFERRING MD :  Mariam Dollar, MD ( Neurosurgery)  CHIEF COMPLAINT:  Medical management  BRIEF PATIENT DESCRIPTION: 79 yo with DM, HTN, CHF , CVA , GERD, Anxiety, Arthritis with severe lumbar spinal stenosis chair disease and degenerative spondylolisthesis at L4-5 as well as L5-S1 s/p  stabilization procedure at these 2 levels.  PCCM consulted for medical management .   LINES / TUBES: 1-23 L IJ CVL>>>  CULTURES: 1-23 bc x 2>>> 1-23 uc>>>   ANTIBIOTICS: Ancef 1/20 x 1 Zosyn 1/24>>>1/27 Vanc 1/24>>>1/27  SIGNIFICANT EVENTS:  1/20 Stabilization L4-L5 and L5-S1 procedure   LEVEL OF CARE:  ICU  PRIMARY SERVICE:  Neurosurgery  CONSULTANTS:  PCCM  DIET:  As tolerated  DVT Px:  SCD  GI Px:  Protonix   INTERVAL HISTORY:  Less confused. AFib now NSR rate 83  On amiod and esmolol drips VITAL SIGNS: Temp:  [98.1 F (36.7 C)-100.1 F (37.8 C)] 100.1 F (37.8 C) (01/27 1544) Pulse Rate:  [79-104] 84  (01/27 1600) Resp:  [18-30] 27  (01/27 1600) BP: (91-157)/(47-78) 133/61 mmHg (01/27 1600) SpO2:  [91 %-100 %] 96 % (01/27 1600) Weight:  [126.5 kg (278 lb 14.1 oz)] 126.5 kg (278 lb 14.1 oz) (01/27 0400)   INTAKE / OUTPUT: Intake/Output      01/26 0701 - 01/27 0700 01/27 0701 - 01/28 0700   P.O. 1440 480   I.V. (mL/kg) 624.1 (4.9) 200.1 (1.6)   IV Piggyback 1405 12.5   Total Intake(mL/kg) 3469.1 (27.4) 692.6 (5.5)   Urine (mL/kg/hr) 3345 (1.1) 347 (0.3)   Total Output 3345 347   Net +124.1 +345.6        Stool Occurrence 2 x 1 x    PHYSICAL EXAMINATION: General:  No distress, oriented Neuro:  No focal deficits HEENT:  WNL Cardiovascular:  Irir, no M Lungs:  clear Abdomen:  Soft, distended, non-tender, NABS Ext: + edema   LABS:  Lab 06/27/12 0506 06/26/12 0550 06/25/12 0500 06/24/12 0400 06/23/12 2051 06/23/12 1510 06/23/12 0800 06/23/12 0435  HGB 9.3* 9.1*  9.1* 9.4* -- -- -- 10.3*  WBC 11.3* 9.1 12.4* 14.4* -- -- -- 17.1*  PLT 289 263 251 -- -- -- -- --  NA 139 140 139 143 -- -- -- 143  K 3.6 2.7* -- -- -- -- -- --  CL 102 100 99 103 -- -- -- 104  CO2 30 33* 30 29 -- -- -- 27  GLUCOSE 135* 127* 147* 162* -- -- -- 159*  BUN 19 19 21 20  -- -- -- 14  CREATININE 0.90 0.95 0.84 0.83 -- -- -- 0.72  CALCIUM 8.1* 8.0* 8.3* 8.8 -- -- -- 9.1  MG 1.9 2.0 -- 2.1 -- -- -- --  PHOS 3.2 2.7 -- 1.2* -- -- -- --  AST -- -- -- -- -- -- -- 29  ALT -- -- -- -- -- -- -- 15  ALKPHOS -- -- -- -- -- -- -- 43  BILITOT -- -- -- -- -- -- -- 0.6  PROT -- -- -- -- -- -- -- 6.7  ALBUMIN -- -- -- -- -- -- -- 3.1*  INR -- -- -- -- -- -- -- --  APTT -- -- -- -- -- -- -- --  INR -- -- -- -- -- -- -- --  LATICACIDVEN -- -- -- -- -- -- -- --  TROPONINI -- -- -- 0.37* 0.37* 0.53* -- --  PHART -- -- -- -- -- -- 7.520* --  PCO2ART -- -- -- -- -- -- 32.1* --  PO2ART -- -- -- -- -- -- 73.0* --  HCO3 -- -- -- -- -- -- 26.1* --  O2SAT -- -- -- -- -- -- 96.5 --   IMAGING: No new CXR   DIAGNOSES: Active Problems:  Degenerative spondylolisthesis  Diabetes mellitus  HTN (hypertension)  Atrial fibrillation with rapid ventricular response  Acute diastolic heart failure  Anxiety  GERD (gastroesophageal reflux disease)  ASSESSMENT / PLAN:  PULMONARY A: Doubt PNA, suspect pulmonary edema P:   Diureses as tolerated.   CARDIOVASCULAR A: A-fib without RVR and HTN. Diastolic hf Acute htn P:  Mgmt per Cards   RENAL A: oliguria, resolved P:   Monitor BMET   GASTROINTESTINAL A:   UC/Crohns disease? P:   Continue Mesalamine. Carb modified diet. PPI D/C'd 1/27 - SUP not indicated in this setting   HEMATOLOGIC A:  Anemia - no acute blood loss, no indication for RBCs P:  Trend CBC. LMWH for DVT proph - tolerating   ENDOCRINE A:  DM. hypokalemia P:   SSI changed to ACHS Resume home DM meds  NEUROLOGIC A:  S/p Redo decompressive lumbar  laminectomy L4-5 in excess will be needed with a standard interbody fusion. ?csf leak P:   Per Neurosurgery.  Infectious dz A: doubt acute bacterial infection P: D/C vanc/zosyn    PCCM will sign off. Please call if we can be of further assistance   Billy Fischer, MD ; [facility] [phone].  After 5:30 PM or weekends, call [phone]

## 2012-06-27 NOTE — Consult Note (Signed)
Physical Medicine and Rehabilitation Consult Reason for Consult: Lumbar stenosis with neurogenic claudication Referring Physician:  Dr. Wynetta Emery   HPI: Phillip Chandler is a 79 y.o. male with history of DM, HTN, prior back surgery who developed progressive back pain with neurogenic claudication due to severe stenosis L4/5 and sL5/S1 spondylolisthesis at L4/L5 with foraminal stenosis of L4 and L5 nerves who elected to undergo redo decompressive lam L4/5 and decompressive lam L5/S1 with fusion L4-S1 by Dr. Wynetta Emery on 06/20/12. Post op course complicated by atrial fibrillation with RVR and delirium.  Was treated with IV amiodarone for rate control and acute CHF treated with diuresis per cards.  He developed fever with question of PNA and was treated with antibiotics. Mentation improved. Therapies ongoing and patient noted to be limited by LE weakness and fatigue. MD, PT, OT, CM recommending CIR   Review of Systems  HENT: Positive for hearing loss.   Eyes: Negative for blurred vision and double vision.  Respiratory: Negative for shortness of breath.   Cardiovascular: Negative for chest pain and palpitations.  Gastrointestinal: Negative for heartburn, nausea and constipation.  Musculoskeletal: Positive for myalgias and back pain.  Neurological: Positive for sensory change.       Chronic left foot drop.   Psychiatric/Behavioral: The patient is not nervous/anxious and does not have insomnia.    Past Medical History  Diagnosis Date  . Complication of anesthesia   . PONV (postoperative nausea and vomiting)   . Hypertension   . Dysrhythmia     afib hx of  . CHF (congestive heart failure)   . Pneumonia     10/2011  . Anxiety   . Diverticula of colon   . Stroke 2005  . GERD (gastroesophageal reflux disease)   . Cancer     basal and squamous cell face  . Arthritis   . Diabetes mellitus without complication    Past Surgical History  Procedure Date  . Joint replacement     Bil Knee  . Hernia repair      Umbicilal-2011  . Eye surgery     Cartaract bil  . Shoulder fusion   . Back surgery 1991   History reviewed. No pertinent family history.  Social History:  Married. Wife needing assist due to recent arm fracture (daughter from [place] here to assist her). Sedentary and limited due to back pain. Used walker. He reports that he has quit smoking. He does not have any smokeless tobacco history on file. He reports that he does not drink alcohol or use illicit drugs.  Allergies  Allergen Reactions  . Fentanyl Other (See Comments)    Personality change- cried a lot.  . Versed (Midazolam) Other (See Comments)    Can in personality.  . Altace (Ramipril)     Shortness of breath  . Sulfa Antibiotics     hives   Medications Prior to Admission  Medication Sig Dispense Refill  . aliskiren (TEKTURNA) 300 MG tablet Take 300 mg by mouth daily.      Marland Kitchen amiodarone (PACERONE) 200 MG tablet Take 200 mg by mouth daily.      . cetirizine (ZYRTEC) 10 MG tablet Take 10 mg by mouth daily.      . cloNIDine (CATAPRES) 0.1 MG tablet Take 0.1 mg by mouth 2 (two) times daily.      . fluticasone (FLONASE) 50 MCG/ACT nasal spray Place 2 sprays into the nose daily.      . furosemide (LASIX) 20 MG tablet Take 20 mg  by mouth 2 (two) times daily.      Marland Kitchen HYDROcodone-acetaminophen (NORCO) 7.5-325 MG per tablet Take 1 tablet by mouth every 4 (four) hours as needed. For pain      . labetalol (NORMODYNE) 200 MG tablet Take 200 mg by mouth 2 (two) times daily.      . mesalamine (LIALDA) 1.2 G EC tablet Take 1,200 mg by mouth daily with breakfast.      . metFORMIN (GLUMETZA) 500 MG (MOD) 24 hr tablet Take 500 mg by mouth daily with breakfast.      . omeprazole (PRILOSEC) 40 MG capsule Take 40 mg by mouth daily.      . pregabalin (LYRICA) 100 MG capsule Take 100 mg by mouth 4 (four) times daily.      . simvastatin (ZOCOR) 40 MG tablet Take 40 mg by mouth every evening.        Home: Home Living Lives With:  Spouse Available Help at Discharge: Family (wife uses cane or RW as well) Type of Home: House Home Access: Level entry Home Layout: One level Bathroom Shower/Tub: Walk-in shower;Door [employer]: Standard (3n1 over commode) Bathroom Accessibility: Yes How Accessible: Accessible via walker Home Adaptive Equipment: Bedside commode/3-in-1;Straight cane;Walker - rolling Additional Comments: wife is older than patient with balance deficits as well.  Functional History: Prior Function Able to Take Stairs?: No Driving: Yes Vocation: Retired Functional Status:  Mobility: Bed Mobility Bed Mobility: Rolling Right;Right Sidelying to Sit Rolling Right: 1: +2 Total assist;With rail Rolling Right: Patient Percentage: 40% Rolling Left: 3: Mod assist;With rail Right Sidelying to Sit: 1: +2 Total assist;HOB flat;With rails Right Sidelying to Sit: Patient Percentage: 50% Left Sidelying to Sit: 1: +2 Total assist;HOB elevated Left Sidelying to Sit: Patient Percentage: 20% Supine to Sit: 1: +2 Total assist;HOB elevated;With rails Supine to Sit: Patient Percentage: 20% Sitting - Scoot to Edge of Bed: 1: +2 Total assist;With rail Sitting - Scoot to Edge of Bed: Patient Percentage: 20% Sit to Supine: 1: +2 Total assist;Other (comment);HOB flat;With rail (total+3 ) Sit to Supine: Patient Percentage: 20% Transfers Transfers: Sit to Stand;Stand to Sit;Squat Pivot Transfers Sit to Stand: 1: +2 Total assist;From elevated surface;From bed;From chair/3-in-1 Sit to Stand: Patient Percentage: 20% Stand to Sit: 1: +2 Total assist;To bed;To elevated surface Stand to Sit: Patient Percentage: 20% Stand Pivot Transfers: 1: +2 Total assist Stand Pivot Transfers: Patient Percentage: 50% Squat Pivot Transfers: 1: +2 Total assist Squat Pivot Transfers: Patient Percentage: 10% Ambulation/Gait Ambulation/Gait Assistance: Not tested (comment)    ADL: ADL Eating/Feeding: Minimal assistance Where Assessed -  Eating/Feeding: Edge of bed Grooming: Wash/dry face;Moderate assistance Where Assessed - Grooming: Supported sitting Upper Body Bathing: Chest;Right arm;Left arm;Abdomen;Moderate assistance Where Assessed - Upper Body Bathing: Supported sitting Lower Body Bathing: +1 Total assistance Where Assessed - Lower Body Bathing: Unsupported sitting Upper Body Dressing: Minimal assistance Where Assessed - Upper Body Dressing: Unsupported sitting Lower Body Dressing: +2 Total assistance Where Assessed - Lower Body Dressing: Supine, head of bed flat Toilet Transfer: +2 Total assistance Toilet Transfer Method: Sit to stand Toilet Transfer Equipment: Raised toilet seat with arms (or 3-in-1 over toilet) Equipment Used: Back brace;Gait belt Transfers/Ambulation Related to ADLs: Pt completed supine<>sit EOB  ADL Comments: Pt with incr arousal with window binds open, all lights turned on, name call, sternal rub, and tactile cue. Pt bed deflated and opening eyes aroused. Pt able to maintain sustained attention at EOB> Pt prolonged sitting EOB and provided food to self feed with  mod v/c. Pt winking randomly during session at therapist to demonstrate attending to questioning. Pt total(A) initially at EOB progressing to Min (A).   Cognition: Cognition Arousal/Alertness: Awake/alert Orientation Level: Oriented X4 Cognition Overall Cognitive Status: Appears within functional limits for tasks assessed/performed Area of Impairment: Attention;Memory;Following commands;Problem solving Arousal/Alertness: Awake/alert Orientation Level: Oriented X4 / Intact Behavior During Session: [facility] for tasks performed Current Attention Level: Sustained Attention - Other Comments: sustained attention ~ 2 minutes to feeding Memory Deficits: no recall of back precautions and needed tactile cue for safety with mobility to maintain back precautions  Following Commands: Follows one step commands inconsistently;Follows one step  commands with increased time  Blood pressure 91/47, pulse 90, temperature 98.4 F (36.9 C), temperature source Oral, resp. rate 24, height 5\' 8"  (1.727 m), weight 126.5 kg (278 lb 14.1 oz), SpO2 95.00%. Physical Exam  Nursing note and vitals reviewed. Constitutional: He is oriented to person, place, and time. He appears well-developed and well-nourished.  HENT:  Head: Normocephalic.  Eyes: Pupils are equal, round, and reactive to light.  Neck: Normal range of motion. Neck supple.  Cardiovascular: Normal rate and regular rhythm.   Pulmonary/Chest: Effort normal and breath sounds normal.  Abdominal: Soft. Bowel sounds are normal. He exhibits no distension. There is no tenderness.  Neurological: He is alert and oriented to person, place, and time.       Speech clear. Follows commands without difficulty. BLE weakness R>LLE.   Skin: Skin is warm.  Psychiatric: He has a normal mood and affect. His behavior is normal. Thought content normal.    Results for orders placed during the hospital encounter of 06/20/12 (from the past 24 hour(s))  VANCOMYCIN, TROUGH     Status: Normal   Collection Time   06/26/12  4:20 PM      Component Value Range   Vancomycin Tr 13.3  10.0 - 20.0 ug/mL  GLUCOSE, CAPILLARY     Status: Abnormal   Collection Time   06/26/12  4:34 PM      Component Value Range   Glucose-Capillary 141 (*) 70 - 99 mg/dL  GLUCOSE, CAPILLARY     Status: Abnormal   Collection Time   06/26/12 10:28 PM      Component Value Range   Glucose-Capillary 177 (*) 70 - 99 mg/dL  CBC     Status: Abnormal   Collection Time   06/27/12  5:06 AM      Component Value Range   WBC 11.3 (*) 4.0 - 10.5 K/uL   RBC 3.26 (*) 4.22 - 5.81 MIL/uL   Hemoglobin 9.3 (*) 13.0 - 17.0 g/dL   HCT 45.4 (*) 09.8 - 11.9 %   MCV 89.3  78.0 - 100.0 fL   MCH 28.5  26.0 - 34.0 pg   MCHC 32.0  30.0 - 36.0 g/dL   RDW 14.7  82.9 - 56.2 %   Platelets 289  150 - 400 K/uL  BASIC METABOLIC PANEL     Status: Abnormal    Collection Time   06/27/12  5:06 AM      Component Value Range   Sodium 139  135 - 145 mEq/L   Potassium 3.6  3.5 - 5.1 mEq/L   Chloride 102  96 - 112 mEq/L   CO2 30  19 - 32 mEq/L   Glucose, Bld 135 (*) 70 - 99 mg/dL   BUN 19  6 - 23 mg/dL   Creatinine, Ser 1.30  0.50 - 1.35 mg/dL   Calcium  8.1 (*) 8.4 - 10.5 mg/dL   GFR calc non Af Amer 79 (*) >90 mL/min   GFR calc Af Amer >90  >90 mL/min  MAGNESIUM     Status: Normal   Collection Time   06/27/12  5:06 AM      Component Value Range   Magnesium 1.9  1.5 - 2.5 mg/dL  PHOSPHORUS     Status: Normal   Collection Time   06/27/12  5:06 AM      Component Value Range   Phosphorus 3.2  2.3 - 4.6 mg/dL  GLUCOSE, CAPILLARY     Status: Abnormal   Collection Time   06/27/12  8:16 AM      Component Value Range   Glucose-Capillary 138 (*) 70 - 99 mg/dL   No results found.  Assessment/Plan: Diagnosis: lumbar lami with re-do with post-op confusion 1. Does the need for close, 24 hr/day medical supervision in concert with the patient's rehab needs make it unreasonable for this patient to be served in a less intensive setting? No 2. Co-Morbidities requiring supervision/potential complications: htn, GERD, PNA 3. Due to bladder management, bowel management, safety, skin/wound care, disease management, medication administration, pain management and patient education, does the patient require 24 hr/day rehab nursing? No 4. Does the patient require coordinated care of a physician, rehab nurse, PT and OT to address physical and functional deficits in the context of the above medical diagnosis(es)? No Addressing deficits in the following areas: balance, endurance, locomotion, strength, transferring, bowel/bladder control, bathing, dressing, feeding, grooming and toileting 5. Can the patient actively participate in an intensive therapy program of at least 3 hrs of therapy per day at least 5 days per week? No 6. The potential for patient to make measurable  gains while on inpatient rehab is fair 7. Anticipated functional outcomes upon discharge from inpatient rehab are n/a with PT, n/a with OT, n/a with SLP. 8. Estimated rehab length of stay to reach the above functional goals is: n/a 9. Does the patient have adequate social supports to accommodate these discharge functional goals? No 10. Anticipated D/C setting: other 11. Anticipated post D/C treatments: HH therapy 12. Overall Rehab/Functional Prognosis: good and fair  RECOMMENDATIONS: This patient's condition is appropriate for continued rehabilitative care in the following setting: SNF Patient has agreed to participate in recommended program. Potentially Note that insurance prior authorization may be required for reimbursement for recommended care.  Comment: Pt and family ultimately prefer [facility]. Pt requiring hoyer lift transfers. Based on exam and his size, participation in 3 hours of therapy would be quite difficult. Recommend short term SNF.  Ranelle Oyster, MD, [place] [facility]     06/27/2012

## 2012-06-27 NOTE — Progress Notes (Signed)
Subjective: Patient reports He feels much better this morning is having no leg pain has some weakness difficulty when standing on his own.  Objective: Vital signs in last 24 hours: Temp:  [98.1 F (36.7 C)-100 F (37.8 C)] 98.1 F (36.7 C) (01/27 0400) Pulse Rate:  [79-113] 91  (01/27 0600) Resp:  [22-30] 23  (01/27 0700) BP: (101-157)/(53-81) 157/75 mmHg (01/27 0700) SpO2:  [94 %-99 %] 96 % (01/27 0700) Weight:  [126.5 kg (278 lb 14.1 oz)] 126.5 kg (278 lb 14.1 oz) (01/27 0400)  Intake/Output from previous day: 01/26 0701 - 01/27 0700 In: 3469.1 [P.O.:1440; I.V.:624.1; IV Piggyback:1405] Out: 3345 [Urine:3345] Intake/Output this shift:    Neurologic improvement more awake alert no significant headache no pain is legs strength is at baseline 4+ out of 5 proximally bilaterally for 4+ out of 5 distally in the right lower extremities baseline left foot drop  Lab Results:  Basename 06/27/12 0506 06/26/12 0550  WBC 11.3* 9.1  HGB 9.3* 9.1*  HCT 29.1* 28.6*  PLT 289 263   BMET  Basename 06/27/12 0506 06/26/12 0550  NA 139 140  K 3.6 2.7*  CL 102 100  CO2 30 33*  GLUCOSE 135* 127*  BUN 19 19  CREATININE 0.90 0.95  CALCIUM 8.1* 8.0*    Studies/Results: No results found.  Assessment/Plan: Continued and progressive mobilization with physical outpatient therapy. Patient is neurologically improving patient will be a good rehabilitation candidate once he is off his amiodarone drip and cleared by critical care and cardiology for rehabilitation.  LOS: 7 days     Tirrell Buchberger P 06/27/2012, 7:30 AM

## 2012-06-27 NOTE — Progress Notes (Signed)
   Patient ID: Phillip Chandler, male   DOB: [birth year], 79 y.o.   MRN: [id number]   Subjective:  Taking PO no complaints Still in afib   Objective:  Filed Vitals:   06/27/12 0400 06/27/12 0500 06/27/12 0600 06/27/12 0700  BP: 122/77 144/77 141/78 157/75  Pulse: 81 82 91   Temp: 98.1 F (36.7 C)     TempSrc: Oral     Resp: 27 28 24 23   Height:      Weight: 278 lb 14.1 oz (126.5 kg)     SpO2: 95% 99% 95% 96%    Intake/Output from previous day:  Intake/Output Summary (Last 24 hours) at 06/27/12 7253 Last data filed at 06/27/12 0700  Gross per 24 hour  Intake 3189.9 ml  Output   3345 ml  Net -155.1 ml    Physical Exam: Physical exam: Well-developed obese in no acute distress.  Skin is warm and dry.  HEENT is normal.  Neck is supple.  Chest is clear to auscultation anteriorly Cardiovascular exam is regular rate and rhythm.  Abdominal exam nontender or distended. No masses palpated. Extremities show trace edema. neuro grossly intact    Lab Results: Basic Metabolic Panel:  Basename 06/27/12 0506 06/26/12 0550  NA 139 140  K 3.6 2.7*  CL 102 100  CO2 30 33*  GLUCOSE 135* 127*  BUN 19 19  CREATININE 0.90 0.95  CALCIUM 8.1* 8.0*  MG 1.9 2.0  PHOS 3.2 2.7   CBC:  Basename 06/27/12 0506 06/26/12 0550  WBC 11.3* 9.1  NEUTROABS -- --  HGB 9.3* 9.1*  HCT 29.1* 28.6*  MCV 89.3 90.2  PLT 289 263   Cardiac Enzymes: No results found for this basename: CKTOTAL:3,CKMB:3,CKMBINDEX:3,TROPONINI:3 in the last 72 hours   Assessment/Plan:  1 Change to PO amiodarone Cannot take coumadin ? Skin necrosis.   Continue aspirin at discharge. Sees [facility] at [facility] And consider novel agent like xarelto  2 acute diastolic congestive heart failure-diresed a liter improved  3 mild elevation in troponin-there is no clear trend up. Apparently patient had a functional study recently that was low risk.  4 status post back surgery-management per neurosurgery. 5 hypertension-  started his oral labetolol and tekturna.   6 hypokalemia-supplement  Charlton Haws 06/27/2012, 8:07 AM

## 2012-06-27 NOTE — Clinical Documentation Improvement (Signed)
DOCUMENTATION CLARIFICATION QUERY  THIS DOCUMENT IS NOT A PERMANENT PART OF THE MEDICAL RECORD         06/27/12  Dear Dr. Eden Emms,  In an effort to better capture your patient's severity of illness, reflect appropriate length of stay and utilization of resources, a review of the patient medical record has revealed the following indicators.   Based on your clinical judgment, please clarify and document in a progress note and/or discharge summary the clinical condition associated with the following supporting information: In responding to this query please exercise your independent judgment.  The fact that a query is asked, does not imply that any particular answer is desired or expected.    According to the documented Height and Weight in CHL/EPIC, the patients BMI is 43.24. If your clinical findings/judgment agrees with this,if possible could you please help clarify the suspected diagnosis in the progress note and discharge summary. THANK YOU!         BEST PRACTICE: A diagnosis of UNDERWEIGHT or MORBID OBESITY should have the BMI documented along with it.  Possible Clinical Conditions?  - Morbid Obesity  - Other condition (please document in the progress notes and/or discharge summary)  - Cannot Clinically determine at this time   Supporting Information: Estimated Body mass index is 43.24 kg/(m^2) as calculated from the following:   Height as of this encounter: 5\' 8" (1.727 m).   Weight as of this encounter: 284 lb 6.3 oz(129 kg).     No additional documentation in chart upon review. SM    Thank You,  Saul Fordyce  Clinical Documentation Specialist: [phone] Pager  Health Information Management [place]

## 2012-06-27 NOTE — Clinical Social Work Note (Signed)
Clinical Social Worker met with patient, patient wife, and patient daughter at bedside to further discuss patient plans at discharge.  Patient states that he would like to go to [facility] for family convenience and visiting during his stay.  Patient states that him and his wife have been the "[place]" there for many years.  CSW spoke with facility who states that they are able to extend a bed offer pending bed availability on day of discharge.  CSW notified patient and family.    Per chart, MD recommending inpatient rehab at this time, however no official consult has been submitted.  CSW will continue to follow patient and family for support and to facilitate patient discharge needs once medically ready.  Phillip Chandler, [place] [phone]

## 2012-06-28 LAB — GLUCOSE, CAPILLARY: Glucose-Capillary: 108 mg/dL — ABNORMAL HIGH (ref 70–99)

## 2012-06-28 MED ORDER — AMIODARONE HCL 200 MG PO TABS: 200.0000 mg | ORAL_TABLET | Freq: Two times a day (BID) | ORAL | Status: DC

## 2012-06-28 MED ORDER — ENSURE PUDDING PO PUDG: 1.0000 | ORAL | Status: DC

## 2012-06-28 MED ORDER — LABETALOL HCL 300 MG PO TABS: 300.0000 mg | ORAL_TABLET | Freq: Two times a day (BID) | ORAL | Status: DC

## 2012-06-28 NOTE — Progress Notes (Signed)
UR completed 

## 2012-06-28 NOTE — Progress Notes (Signed)
Subjective: Patient reports Is healing a lot better really pain is well controlled no headaches legs are moving better he is getting better every time to get him out of bed  Objective: Vital signs in last 24 hours: Temp:  [97.4 F (36.3 C)-100.1 F (37.8 C)] 97.4 F (36.3 C) (01/28 0419) Pulse Rate:  [69-104] 86  (01/28 0700) Resp:  [18-27] 22  (01/28 0700) BP: (91-161)/(47-89) 145/89 mmHg (01/28 0700) SpO2:  [91 %-100 %] 98 % (01/28 0700) Weight:  [131.4 kg (289 lb 11 oz)] 131.4 kg (289 lb 11 oz) (01/28 0500)  Intake/Output from previous day: 01/27 0701 - 01/28 0700 In: 1112.6 [P.O.:600; I.V.:500.1; IV Piggyback:12.5] Out: 1097 [Urine:1097] Intake/Output this shift:    Awake alert oriented strength is at baseline f 4+ out of 5 in both lower extremities at baseline left-sided footdrop  Lab Results:  [facility] 06/27/12 0506 06/26/12 0550  WBC 11.3* 9.1  HGB 9.3* 9.1*  HCT 29.1* 28.6*  PLT 289 263   BMET  Basename 06/27/12 0506 06/26/12 0550  NA 139 140  K 3.6 2.7*  CL 102 100  CO2 30 33*  GLUCOSE 135* 127*  BUN 19 19  CREATININE 0.90 0.95  CALCIUM 8.1* 8.0*    Studies/Results: No results found.  Assessment/Plan: Patient seems to be recovered very well he seems to be off all of his drips labs appear to be stable neurologic exam appears to be improving it is okay with me for him to be transferred to rehabilitation when he is cleared from the medical services  LOS: 8 days     Yanelle Sousa P 06/28/2012, 7:35 AM

## 2012-06-28 NOTE — Progress Notes (Signed)
Discussed pt's functional progress with therapy today with Dr. Riley Kill. We can not justify medical necessity and lessening burden of care to admit pt to inpt rehab at this time. SNF rehab is recommended at this time. I met with patient and explained this denial to pt. He is aware that [facility] is available when pt ready to d/c. SW and RN are aware. [phone]

## 2012-06-28 NOTE — Progress Notes (Signed)
Nursing 1330 Patient's daughter concerned that patient is not being discharged on Lovenox.  Dr. Eden Emms called and made aware.  Patient does not need to be discharged on Lovenox per Dr. Eden Emms, but should follow up with his cardiologist outpatient per Dr. Eden Emms due to his atrial fibrillation.  Patient and family made aware and understood instructions.

## 2012-06-28 NOTE — Progress Notes (Signed)
Occupational Therapy Treatment Patient Details Name: Phillip Chandler MRN: [id number] DOB: [birth year] Today's Date: 06/28/2012 Time: 7829-5621 OT Time Calculation (min): 30 min  OT Assessment / Plan / Recommendation Comments on Treatment Session Pt with incr activity tolerance for OOB<>chair and demonstrates ambulation this session. Pt very eager to participate and progressing. Pt remains excellent CIR candidate. Pt with improved mobility with decr edema in bil LE to don shoes with Lt foot prafo for drop foot.    Follow Up Recommendations  CIR    Barriers to Discharge       Equipment Recommendations  3 in 1 bedside comode;Wheelchair (measurements OT);Wheelchair cushion (measurements OT)    Recommendations for Other Services Rehab consult  Frequency Min 2X/week   Plan Discharge plan remains appropriate    Precautions / Restrictions Precautions Precautions: Back Precaution Comments: Pt able to recall 2/3 back precautions and needed reedcuation on no twisting. Required Braces or Orthoses: Spinal Brace Spinal Brace: Lumbar corset;Applied in sitting position Restrictions Weight Bearing Restrictions: No   Pertinent Vitals/Pain Stable HR and BP Pt with decr Oxgyen on RA to 86% Pt replaced on 2l nasal cannula    ADL  Eating/Feeding: Set up Where Assessed - Eating/Feeding: Chair Grooming: Wash/dry face;Set up Where Assessed - Grooming: Supported sitting Upper Body Dressing: Maximal assistance Where Assessed - Upper Body Dressing: Supported sitting Lower Body Dressing: +1 Total assistance Where Assessed - Lower Body Dressing: Unsupported sitting (don bil shoes) Toilet Transfer: +2 Total assistance Toilet Transfer: Patient Percentage: 50% Toilet Transfer Method: Sit to stand Toilet Transfer Equipment: Raised toilet seat with arms (or 3-in-1 over toilet) Equipment Used: Gait belt;Back brace;Other (comment);Rolling walker (boot with Lt foot prafo for drop foot) Transfers/Ambulation  Related to ADLs: Pt complete basic transfer with total +2 (A) pt 50%. Pt with improved ability to place bil LE in a flexed position. Pt with vc for hand placement and hand placement on RW. Pt completed ambulation ~25 ft (EOB to nursing station on 3100). Pt required x3 rest breaks but very eager to continue ADL Comments: Pt smiling at end of session saying "i have good news today I can say I walked" Pt eager to progress and verbalizes to therapy how much he appreciates therapy coming. Pt progressed with bed mobility, sit<>Stand and ambulation this Session. (Recalled 2 out 3 back precautions)    OT Diagnosis:    OT Problem List:   OT Treatment Interventions:     OT Goals Acute Rehab OT Goals OT Goal Formulation: With patient Time For Goal Achievement: 07/05/12 Potential to Achieve Goals: Good ADL Goals Pt Will Perform Grooming: with set-up;Sitting, chair;Supported ADL Goal: Grooming - Progress: Progressing toward goals Pt Will Perform Upper Body Bathing: with set-up;Sitting, chair;Supported ADL Goal: Product manager - Progress: Progressing toward goals Pt Will Perform Upper Body Dressing: with set-up;Sitting, chair;Supported ADL Goal: Patent attorney - Progress: Progressing toward goals Pt Will Transfer to Toilet: with max assist;with DME;3-in-1 ADL Goal: Statistician - Progress: Progressing toward goals Miscellaneous OT Goals Miscellaneous OT Goal #1: Pt will complete bed mobility Min (A) as precursor to adls with HOB <20 degrees no rail as precursor to adls OT Goal: Miscellaneous Goal #1 - Progress: Progressing toward goals  Visit Information  Last OT Received On: 06/28/12 Assistance Needed: +2 PT/OT Co-Evaluation/Treatment: Yes    Subjective Data      Prior Functioning       Cognition  Overall Cognitive Status: Appears within functional limits for tasks assessed/performed Area of Impairment:  Attention;Memory;Following commands;Problem solving Arousal/Alertness:  Awake/alert Orientation Level: Oriented X4 / Intact Behavior During Session: [facility] for tasks performed    Mobility  Shoulder Instructions Bed Mobility Bed Mobility: Rolling Right;Right Sidelying to Sit Rolling Right: 3: Mod assist;With rail Right Sidelying to Sit: 2: Max assist;HOB flat Details for Bed Mobility Assistance: (A) to complete roll with cues for technique and hand rail.  (A) to elevate trunk OOB with cues for proper technique to prevent twisting. Transfers Sit to Stand: 1: +2 Total assist;From elevated surface;From bed;From chair/3-in-1 Sit to Stand: Patient Percentage: 50% Stand to Sit: 1: +2 Total assist;To bed;To elevated surface Stand to Sit: Patient Percentage: 50% Details for Transfer Assistance: +2 (A) to initaite transfer with cues for LE placement and left AFO donned for support.  Cues for hand placement.         Exercises      Balance Balance Balance Assessed: Yes Static Standing Balance Static Standing - Balance Support: Bilateral upper extremity supported Static Standing - Level of Assistance: 4: Min assist Static Standing - Comment/# of Minutes: ~1 minute prior to gait. (A) to maintain balance with cues for weight shifts to prepare gait.   End of Session OT - End of Session Activity Tolerance: Patient tolerated treatment well Patient left: in chair;with call bell/phone within reach Nurse Communication: Mobility status;Precautions  GO     Lucile Shutters 06/28/2012, 10:46 AM Pager: [phone]

## 2012-06-28 NOTE — Progress Notes (Signed)
INITIAL NUTRITION ASSESSMENT  DOCUMENTATION CODES Per approved criteria  -Morbid Obesity   INTERVENTION: Ensure complete once a day. Supplement provides 350 kcal and 13 g protein.   NUTRITION DIAGNOSIS: Inadequate oral intake related to decreased appetite 2/2 back surgery as evidenced by consumption of less than 75% of meals.   Goal: Pt to meet >/= 90% of estimated needs   Monitor:  Weight, po intake   Reason for Assessment: Malnutrition Screening Tool (MST=3)  79 y.o. male  Admitting Dx: <principal problem not specified>  ASSESSMENT: Pt is 79 yo man with severe lumbar spinal stenosis chair disease and degenerative spondylolisthesis. Pt presents for decompression stabilization procedure.  Pt is sedentary and limited due to back pain. Used walker. Pt has PMH of anxiety, arthritis, CVA, DM, HYT, Atrial fibrillation with rapid ventricular response, scute diastolic heart failure, GERD.  After procedure, post op course complicated by atrial fibrillation with RVR and delirium. Was treated with IV amiodarone for rate control and acute CHF treated with diuresis per cards. He developed fever with question of PNA and was treated with antibiotics. Mentation improved. Therapies ongoing and patient noted to be limited by LE weakness and fatigue. MD, PT, OT, CM recommending CIR.  RD spoke with pt about weight loss and decreased appetite reported in the MST. Pt stated that any weight loss was most likely fluid. Pt also stated that he had only experienced decreased appetite while admitted.  Decreased appetite was d/t not feeling well.  Pt's appetite has returned and he is looking forward to eating. Pt normally drinks boost at home and feels that an oral supplement in the hospital would be beneficial.     Height: Ht Readings from Last 1 Encounters:  06/20/12 5\' 8"  (1.727 m)    Weight: Wt Readings from Last 1 Encounters:  06/28/12 289 lb 11 oz (131.4 kg)    Ideal Body Weight: 154 lbs    %  Ideal Body Weight: 187%  Wt Readings from Last 10 Encounters:  06/28/12 289 lb 11 oz (131.4 kg)  06/28/12 289 lb 11 oz (131.4 kg)    Usual Body Weight: ~280 lbs   % Usual Body Weight: 103%  BMI:  Body mass index is 44.05 kg/(m^2). - Morbid obese  Estimated Nutritional Needs: Kcal: 2100 - 2300  Protein: 100 - 105 g  Fluid: 2.1 - 2.3 L   Skin: intact  Diet Order: Carb Control  EDUCATION NEEDS: -No education needs identified at this time   Intake/Output Summary (Last 24 hours) at 06/28/12 0837 Last data filed at 06/28/12 0800  Gross per 24 hour  Intake 1093.4 ml  Output   1082 ml  Net   11.4 ml    Last BM: 06/28/2012   Labs:   Lab 06/27/12 0506 06/26/12 0550 06/25/12 0500 06/24/12 0400  NA 139 140 139 --  K 3.6 2.7* 2.5* --  CL 102 100 99 --  CO2 30 33* 30 --  BUN 19 19 21  --  CREATININE 0.90 0.95 0.84 --  CALCIUM 8.1* 8.0* 8.3* --  MG 1.9 2.0 -- 2.1  PHOS 3.2 2.7 -- 1.2*  GLUCOSE 135* 127* 147* --    CBG (last 3)   Basename 06/27/12 2202 06/27/12 1657 06/27/12 1209  GLUCAP 130* 132* 138*    Scheduled Meds:   . aliskiren  300 mg Oral Daily  . amiodarone  200 mg Oral BID  . antiseptic oral rinse  15 mL Mouth Rinse BID  . aspirin EC  81  mg Oral Daily  . atorvastatin  20 mg Oral q1800  . cloNIDine  0.3 mg Transdermal Q Fri  . docusate sodium  100 mg Oral BID  . enoxaparin (LOVENOX) injection  40 mg Subcutaneous Q24H  . fluticasone  2 spray Each Nare Daily  . insulin aspart  0-9 Units Subcutaneous TID WC  . labetalol  300 mg Oral BID  . mesalamine  1,200 mg Oral Q breakfast  . metFORMIN  500 mg Oral Q breakfast  . potassium chloride  40 mEq Oral TID  . pregabalin  100 mg Oral QID  . sodium chloride  3 mL Intravenous Q12H    Continuous Infusions:   . sodium chloride    . sodium chloride 20 mL/hr at 06/28/12 0800    Past Medical History  Diagnosis Date  . Complication of anesthesia   . PONV (postoperative nausea and vomiting)   .  Hypertension   . Dysrhythmia     afib hx of  . CHF (congestive heart failure)   . Pneumonia     10/2011  . Anxiety   . Diverticula of colon   . Stroke 2005  . GERD (gastroesophageal reflux disease)   . Cancer     basal and squamous cell face  . Arthritis   . Diabetes mellitus without complication     Past Surgical History  Procedure Date  . Joint replacement     Bil Knee  . Hernia repair     Umbicilal-2011  . Eye surgery     Cartaract bil  . Shoulder fusion   . Back surgery 1991    Belenda Cruise  Dietetic Intern Pager: [phone]  Agree with note, additions and edits added in blue.  Clarene Duke RD, LDN Pager [phone] After Hours pager [phone]

## 2012-06-28 NOTE — Progress Notes (Signed)
Physical Therapy Treatment Patient Details Name: Phillip Chandler MRN: [id number] DOB: 08/04/1930 Today's Date: 06/28/2012 Time: 8295-6213 PT Time Calculation (min): 31 min  PT Assessment / Plan / Recommendation Comments on Treatment Session  Pt showing great progress today. Pt able to improve overall mobility with less assistance and able to ambulate 30'.  Pt very motivated and willing to work.  Continue to recommend inpatient rehab for further strengthening, gait and overall mobility to increase independence prior to d/c home.    Follow Up Recommendations  CIR     Equipment Recommendations  None recommended by PT    Recommendations for Other Services Rehab consult  Frequency Min 5X/week   Plan Discharge plan remains appropriate;Frequency remains appropriate    Precautions / Restrictions Precautions Precautions: Back Precaution Comments: Pt able to recall 2/3 back precautions and needed reedcuation on no twisting. Required Braces or Orthoses: Spinal Brace Spinal Brace: Lumbar corset;Applied in sitting position Restrictions Weight Bearing Restrictions: No   Pertinent Vitals/Pain 3/10 back pain    Mobility  Bed Mobility Bed Mobility: Rolling Right;Right Sidelying to Sit Rolling Right: 3: Mod assist Right Sidelying to Sit: 2: Max assist Details for Bed Mobility Assistance: (A) to complete roll with cues for technique and hand rail.  (A) to elevate trunk OOB with cues for proper technique to prevent twisting. Transfers Transfers: Sit to Stand;Stand to Sit;Squat Pivot Transfers Sit to Stand: 1: +2 Total assist;From elevated surface;From bed;From chair/3-in-1 Sit to Stand: Patient Percentage: 50% Stand to Sit: 1: +2 Total assist;To bed;To elevated surface Stand to Sit: Patient Percentage: 50% Details for Transfer Assistance: +2 (A) to initaite transfer with cues for LE placement and left AFO donned for support.  Cues for hand placement.   Ambulation/Gait Ambulation/Gait  Assistance: 1: +2 Total assist Ambulation/Gait: Patient Percentage: 60% Ambulation Distance (Feet): 30 Feet (10' x 3; seated rest breaks) Assistive device: Rolling walker Ambulation/Gait Assistance Details: +2 (A) to maintain balance and RW placement.  Cues for step sequence with occasional manual cues to advane left LE.  Pt able to donn left AFO to improve overall mobility. Gait Pattern: Step-to pattern;Decreased stride length;Shuffle;Decreased trunk rotation;Trunk flexed Gait velocity: decreased gait speed Stairs: No    Exercises     PT Diagnosis:    PT Problem List:   PT Treatment Interventions:     PT Goals Acute Rehab PT Goals PT Goal Formulation: With patient Time For Goal Achievement: 07/05/12 Potential to Achieve Goals: Good Pt will go Supine/Side to Sit: with supervision PT Goal: Supine/Side to Sit - Progress: Progressing toward goal Pt will Sit at Edge of Bed: with modified independence;1-2 min PT Goal: Sit at Edge Of Bed - Progress: Progressing toward goal Pt will go Sit to Supine/Side: with min assist PT Goal: Sit to Supine/Side - Progress: Progressing toward goal Pt will go Sit to Stand: with supervision PT Goal: Sit to Stand - Progress: Progressing toward goal Pt will go Stand to Sit: with supervision PT Goal: Stand to Sit - Progress: Progressing toward goal Pt will Transfer Bed to Chair/Chair to Bed: with supervision PT Transfer Goal: Bed to Chair/Chair to Bed - Progress: Progressing toward goal Pt will Ambulate: 16 - 50 feet;with min assist;with rolling walker PT Goal: Ambulate - Progress: Progressing toward goal  Visit Information  Last PT Received On: 06/28/12 Assistance Needed: +2    Subjective Data  Subjective: "I walked today.  I'm so happy." Patient Stated Goal: To go to rehab   Cognition  Overall Cognitive Status:  Appears within functional limits for tasks assessed/performed Area of Impairment: Attention;Memory;Following commands;Problem  solving Arousal/Alertness: Awake/alert Orientation Level: Oriented X4 / Intact Behavior During Session: [facility] for tasks performed    Balance  Balance Balance Assessed: Yes Static Standing Balance Static Standing - Balance Support: Bilateral upper extremity supported Static Standing - Level of Assistance: 4: Min assist Static Standing - Comment/# of Minutes: ~1 minute prior to gait. (A) to maintain balance with cues for weight shifts to prepare gait.  End of Session PT - End of Session Equipment Utilized During Treatment: Gait belt;Back brace (left AFO) Activity Tolerance: Patient tolerated treatment well Patient left: in chair;with call bell/phone within reach;with nursing in room Nurse Communication: Mobility status;Need for lift equipment   GP     Mikaelah Trostle 06/28/2012, 10:29 AM [name], PT DPT [phone]

## 2012-06-28 NOTE — Discharge Summary (Signed)
Physician Discharge Summary  Patient ID: Phillip Chandler MRN: [id number] DOB/AGE: May 12, 1931 79 y.o.  Admit date: 06/20/2012 Discharge date: 06/28/2012  Admission Diagnoses:lumbar spondylosis with stenosis L4-5 L5-S1  Discharge Diagnoses: sameActive Problems:  Degenerative spondylolisthesis  Diabetes mellitus  HTN (hypertension)  Atrial fibrillation with rapid ventricular response  Acute diastolic heart failure  Anxiety  GERD (gastroesophageal reflux disease)   Discharged Condition: good  Hospital Course: patient is a 79 year old gentleman presented with lumbar stenosis and underwent a lumbar L4-5 L5-S1 posterior lumbar interbody fusion postoperatively patient went to the ICU secondary to chronic medical conditions initially did fairly well however began experiencing some major fibrillation care was consult medially postop call the patient as well as when he went into atrial fibrillation and followed as well as consult cardiology. Defibrillation was stabilized on asthma wall and amiodarone drapes neurologically he continued to improve. He did have some confusion followup x-rays to workup her confusion showed either pulmonary edema or possible infiltrate he was started on broad-spectrum Vioxx and over the next several days his mental status improved significantly his heart rate reverted back to normal sinus rhythm he was taken off of his drapes convert oral medication as well as neurologically with physical therapy he was at the point where he was ambulatory. In stable and be discharged to [facility] facility. Patient should maintain with physical outpatient therapy to maintain followups with his cardiologist and general medical doctor should follow with me in approximately 2 weeks.  Consults: Significant Diagnostic Studies: Treatments:L4-5 posterior lumbar interbody fusion Discharge Exam: Blood pressure 95/62, pulse 87, temperature 97.5 F (36.4 C), temperature  source Oral, resp. rate 21, height 5\' 8"  (1.727 m), weight 131.4 kg (289 lb 11 oz), SpO2 99.00%. Strength 5 out of 5 except for her baseline left foot drop  Disposition: [facility] facility     Medication List     As of 06/28/2012 12:20 PM    TAKE these medications         aliskiren 300 MG tablet   Commonly known as: TEKTURNA   Take 300 mg by mouth daily.      amiodarone 200 MG tablet   Commonly known as: PACERONE   Take 1 tablet (200 mg total) by mouth 2 (two) times daily.      amiodarone 200 MG tablet   Commonly known as: PACERONE   Take 200 mg by mouth daily.      cetirizine 10 MG tablet   Commonly known as: ZYRTEC   Take 10 mg by mouth daily.      cloNIDine 0.1 MG tablet   Commonly known as: CATAPRES   Take 0.1 mg by mouth 2 (two) times daily.      fluticasone 50 MCG/ACT nasal spray   Commonly known as: FLONASE   Place 2 sprays into the nose daily.      furosemide 20 MG tablet   Commonly known as: LASIX   Take 20 mg by mouth 2 (two) times daily.      HYDROcodone-acetaminophen 7.5-325 MG per tablet   Commonly known as: NORCO   Take 1 tablet by mouth every 4 (four) hours as needed. For pain      labetalol 300 MG tablet   Commonly known as: NORMODYNE   Take 1 tablet (300 mg total) by mouth 2 (two) times daily.      labetalol 200 MG tablet   Commonly known as: NORMODYNE   Take 200 mg by mouth 2 (two) times  daily.      mesalamine 1.2 G EC tablet   Commonly known as: LIALDA   Take 1,200 mg by mouth daily with breakfast.      metFORMIN 500 MG (MOD) 24 hr tablet   Commonly known as: GLUMETZA   Take 500 mg by mouth daily with breakfast.      omeprazole 40 MG capsule   Commonly known as: PRILOSEC   Take 40 mg by mouth daily.      pregabalin 100 MG capsule   Commonly known as: LYRICA   Take 100 mg by mouth 4 (four) times daily.      simvastatin 40 MG tablet   Commonly known as: ZOCOR   Take 40 mg by mouth every evening.          Signed: [name] 06/28/2012, 12:20 PM

## 2012-06-28 NOTE — Clinical Social Work Note (Signed)
Clinical Social Worker facilitated patient discharge including contacting patient family and facility to confirm discharge plans to [facility].  Patient and patient family request ambulance transportation due to the need for a lift to get patient from bed to chair - CSW arranged ambulance transport with [facility] to [facility].  RN to call report prior to patient discharge.    Clinical Social Worker will sign off for now as social work intervention is no longer needed. Please consult [place] again if new need arises.  Macario Golds, [place] [phone]

## 2012-06-28 NOTE — Progress Notes (Signed)
Nursing 1400 Patient being discharged to [facility] in [place].  EMS to bedside to transport patient.  BP 108/66 (74) at discharge.  Patient has MD order for discharge, patient increasing activity, adequate for discharge.  Patient voiding and tolerating diet. PIV and condom cath removed.  Education and patient discharge instructions completed.  Patient medications reviewed. Belongings sent with patient.   Patient instructed to follow up with his Cardiologist within the week.  Patient discharged.

## 2012-06-29 LAB — CULTURE, BLOOD (ROUTINE X 2)
Culture: NO GROWTH
Culture: NO GROWTH

## 2012-06-29 LAB — GLUCOSE, CAPILLARY: Glucose-Capillary: 127 mg/dL — ABNORMAL HIGH (ref 70–99)

## 2012-07-07 LAB — POCT I-STAT 7, (LYTES, BLD GAS, ICA,H+H)
Acid-Base Excess: 7 mmol/L — ABNORMAL HIGH (ref 0.0–2.0)
Bicarbonate: 30.2 mEq/L — ABNORMAL HIGH (ref 20.0–24.0)
Calcium, Ion: 1.05 mmol/L — ABNORMAL LOW (ref 1.13–1.30)
HCT: 29 % — ABNORMAL LOW (ref 39.0–52.0)
Hemoglobin: 9.9 g/dL — ABNORMAL LOW (ref 13.0–17.0)
O2 Saturation: 100 %
Patient temperature: 36.3
Potassium: 3.3 mEq/L — ABNORMAL LOW (ref 3.5–5.1)
Sodium: 141 mEq/L (ref 135–145)
TCO2: 31 mmol/L (ref 0–100)
pCO2 arterial: 35.9 mmHg (ref 35.0–45.0)
pH, Arterial: 7.53 — ABNORMAL HIGH (ref 7.350–7.450)
pO2, Arterial: 323 mmHg — ABNORMAL HIGH (ref 80.0–100.0)

## 2012-07-07 LAB — POCT I-STAT EG7
Acid-Base Excess: 7 mmol/L — ABNORMAL HIGH (ref 0.0–2.0)
Calcium, Ion: 1.05 mmol/L — ABNORMAL LOW (ref 1.13–1.30)
HCT: 29 % — ABNORMAL LOW (ref 39.0–52.0)
Hemoglobin: 9.9 g/dL — ABNORMAL LOW (ref 13.0–17.0)
O2 Saturation: 100 %
Patient temperature: 36.3
Potassium: 3.3 mEq/L — ABNORMAL LOW (ref 3.5–5.1)
Sodium: 141 mEq/L (ref 135–145)
TCO2: 31 mmol/L (ref 0–100)
pCO2, Ven: 35.9 mmHg — ABNORMAL LOW (ref 45.0–50.0)
pH, Ven: 7.53 — ABNORMAL HIGH (ref 7.250–7.300)
pO2, Ven: 323 mmHg — ABNORMAL HIGH (ref 30.0–45.0)

## 2012-07-07 LAB — POCT I-STAT 4, (NA,K, GLUC, HGB,HCT)
Glucose, Bld: 118 mg/dL — ABNORMAL HIGH (ref 70–99)
HCT: 26 % — ABNORMAL LOW (ref 39.0–52.0)
Hemoglobin: 8.8 g/dL — ABNORMAL LOW (ref 13.0–17.0)
Potassium: 3.3 mEq/L — ABNORMAL LOW (ref 3.5–5.1)
Sodium: 141 mEq/L (ref 135–145)

## 2012-11-25 ENCOUNTER — Other Ambulatory Visit: Payer: Self-pay | Admitting: Neurosurgery

## 2012-11-25 DIAGNOSIS — M4800 Spinal stenosis, site unspecified: Secondary | ICD-10-CM

## 2012-12-16 ENCOUNTER — Ambulatory Visit
Admission: RE | Admit: 2012-12-16 | Discharge: 2012-12-16 | Disposition: A | Discharge status: Home / Self Care | Payer: Medicare Other | Source: Ambulatory Visit | Attending: Neurosurgery | Admitting: Neurosurgery

## 2012-12-16 DIAGNOSIS — M4800 Spinal stenosis, site unspecified: Secondary | ICD-10-CM

## 2012-12-16 MED ORDER — GADOBENATE DIMEGLUMINE 529 MG/ML IV SOLN
20.0000 mL | Freq: Once | INTRAVENOUS | Status: AC | PRN
  Administered 2012-12-16: 20 mL via INTRAVENOUS

## 2013-10-17 ENCOUNTER — Encounter: Payer: Self-pay | Admitting: Podiatrist

## 2013-10-17 ENCOUNTER — Ambulatory Visit (INDEPENDENT_AMBULATORY_CARE_PROVIDER_SITE_OTHER): Payer: Medicare Other | Admitting: Podiatrist

## 2013-10-17 VITALS — BP 151/73 | HR 78 | Resp 18

## 2013-10-17 DIAGNOSIS — B351 Tinea unguium: Secondary | ICD-10-CM

## 2013-10-17 DIAGNOSIS — M79609 Pain in unspecified limb: Secondary | ICD-10-CM

## 2013-10-17 NOTE — Patient Instructions (Addendum)
naftin cream samples-- apply once a day to the instep of the right foot for 2 weeks.  [facility]-- a place in [place] that is great at shoe and inserts and modifications.  [employer] [facility]  Call before you go-- don is the guy who you would need to see--   [phone]

## 2013-10-17 NOTE — Progress Notes (Signed)
   Subjective:    Patient ID: Phillip Chandler, male    DOB: [birth year], 80 y.o.   MRN: [id number]  HPI I am here to get my toenails trimmed up and I am peeling on my right foot and I am a diabetic and burning and throbbing and nerve damage in my feet and legs    Review of Systems  Genitourinary: Positive for frequency.  Allergic/Immunologic: Positive for environmental allergies.  Hematological: Bruises/bleeds easily.  All other systems reviewed and are negative.      Objective:   Physical Exam GENERAL APPEARANCE: Alert, conversant. Appropriately groomed. No acute distress.  VASCULAR: Pedal pulses intact at 2+ DP and 2+present PT bilateral.  Capillary refill time is immediate to all digits,  Proximal to distal cooling it warm to warm.  Digital hair growth is present bilateral  NEUROLOGIC: sensation is impaired to 5.07 monofilament at 0/5 sites bilateral.  Light touch is impaired bilateral, vibratory sensation impaired bilateral, achilles tendon reflex is impaired bilateral.  MUSCULOSKELETAL: intact muscle strength, tone and stability bilateral.   DERMATOLOGIC: skin color, texture, and turger are within normal limits.  Digital nails are thickened, discolored, distrophic, mycotic bilateral feet    Assessment & Plan:  Symptomatic mycotic nails , diabetes with neuropathy Plan:  Discussed diabetic foot care and an information and recommendations for foot care dispensed.  Nail debridement was carried out without complication.  He will be seen back every 3 months for continued care and checks.

## 2014-01-17 DIAGNOSIS — M76899 Other specified enthesopathies of unspecified lower limb, excluding foot: Secondary | ICD-10-CM | POA: Insufficient documentation

## 2014-01-17 DIAGNOSIS — G723 Periodic paralysis: Secondary | ICD-10-CM | POA: Insufficient documentation

## 2014-01-17 DIAGNOSIS — M179 Osteoarthritis of knee, unspecified: Secondary | ICD-10-CM | POA: Insufficient documentation

## 2014-01-17 DIAGNOSIS — E876 Hypokalemia: Secondary | ICD-10-CM | POA: Insufficient documentation

## 2014-01-17 DIAGNOSIS — J189 Pneumonia, unspecified organism: Secondary | ICD-10-CM | POA: Insufficient documentation

## 2014-01-17 DIAGNOSIS — I10 Essential (primary) hypertension: Secondary | ICD-10-CM | POA: Insufficient documentation

## 2014-01-17 DIAGNOSIS — J45909 Unspecified asthma, uncomplicated: Secondary | ICD-10-CM | POA: Insufficient documentation

## 2014-01-17 DIAGNOSIS — M545 Low back pain, unspecified: Secondary | ICD-10-CM | POA: Insufficient documentation

## 2014-01-17 DIAGNOSIS — M1710 Unilateral primary osteoarthritis, unspecified knee: Secondary | ICD-10-CM | POA: Insufficient documentation

## 2014-01-17 DIAGNOSIS — I6529 Occlusion and stenosis of unspecified carotid artery: Secondary | ICD-10-CM | POA: Insufficient documentation

## 2014-01-17 DIAGNOSIS — R0601 Orthopnea: Secondary | ICD-10-CM | POA: Insufficient documentation

## 2014-01-17 DIAGNOSIS — B351 Tinea unguium: Secondary | ICD-10-CM | POA: Insufficient documentation

## 2014-01-17 DIAGNOSIS — M48061 Spinal stenosis, lumbar region without neurogenic claudication: Secondary | ICD-10-CM | POA: Insufficient documentation

## 2014-01-17 DIAGNOSIS — G5620 Lesion of ulnar nerve, unspecified upper limb: Secondary | ICD-10-CM | POA: Insufficient documentation

## 2014-01-17 DIAGNOSIS — E875 Hyperkalemia: Secondary | ICD-10-CM | POA: Insufficient documentation

## 2014-01-17 DIAGNOSIS — L84 Corns and callosities: Secondary | ICD-10-CM | POA: Insufficient documentation

## 2014-01-17 DIAGNOSIS — D509 Iron deficiency anemia, unspecified: Secondary | ICD-10-CM | POA: Insufficient documentation

## 2014-01-17 DIAGNOSIS — M2540 Effusion, unspecified joint: Secondary | ICD-10-CM | POA: Insufficient documentation

## 2014-01-17 DIAGNOSIS — M25519 Pain in unspecified shoulder: Secondary | ICD-10-CM | POA: Insufficient documentation

## 2014-01-17 DIAGNOSIS — M5136 Other intervertebral disc degeneration, lumbar region: Secondary | ICD-10-CM | POA: Insufficient documentation

## 2014-01-17 DIAGNOSIS — I6782 Cerebral ischemia: Secondary | ICD-10-CM | POA: Insufficient documentation

## 2014-01-17 DIAGNOSIS — E785 Hyperlipidemia, unspecified: Secondary | ICD-10-CM | POA: Insufficient documentation

## 2014-01-17 DIAGNOSIS — J309 Allergic rhinitis, unspecified: Secondary | ICD-10-CM | POA: Insufficient documentation

## 2014-01-17 DIAGNOSIS — J449 Chronic obstructive pulmonary disease, unspecified: Secondary | ICD-10-CM | POA: Insufficient documentation

## 2014-01-17 DIAGNOSIS — M2550 Pain in unspecified joint: Secondary | ICD-10-CM | POA: Insufficient documentation

## 2014-01-17 DIAGNOSIS — M25549 Pain in joints of unspecified hand: Secondary | ICD-10-CM | POA: Insufficient documentation

## 2014-01-17 DIAGNOSIS — J984 Other disorders of lung: Secondary | ICD-10-CM | POA: Insufficient documentation

## 2014-01-17 DIAGNOSIS — I739 Peripheral vascular disease, unspecified: Secondary | ICD-10-CM | POA: Insufficient documentation

## 2014-01-17 DIAGNOSIS — L309 Dermatitis, unspecified: Secondary | ICD-10-CM | POA: Insufficient documentation

## 2014-01-17 DIAGNOSIS — R600 Localized edema: Secondary | ICD-10-CM | POA: Insufficient documentation

## 2014-01-17 DIAGNOSIS — M25559 Pain in unspecified hip: Secondary | ICD-10-CM | POA: Insufficient documentation

## 2014-01-17 DIAGNOSIS — I509 Heart failure, unspecified: Secondary | ICD-10-CM | POA: Insufficient documentation

## 2014-01-17 DIAGNOSIS — R0602 Shortness of breath: Secondary | ICD-10-CM | POA: Insufficient documentation

## 2014-01-18 DIAGNOSIS — S143XXA Injury of brachial plexus, initial encounter: Secondary | ICD-10-CM | POA: Insufficient documentation

## 2014-01-18 DIAGNOSIS — G5620 Lesion of ulnar nerve, unspecified upper limb: Secondary | ICD-10-CM | POA: Insufficient documentation

## 2014-01-18 DIAGNOSIS — M479 Spondylosis, unspecified: Secondary | ICD-10-CM | POA: Insufficient documentation

## 2014-01-18 DIAGNOSIS — R233 Spontaneous ecchymoses: Secondary | ICD-10-CM | POA: Insufficient documentation

## 2014-01-18 DIAGNOSIS — S46119A Strain of muscle, fascia and tendon of long head of biceps, unspecified arm, initial encounter: Secondary | ICD-10-CM | POA: Insufficient documentation

## 2014-01-18 DIAGNOSIS — K429 Umbilical hernia without obstruction or gangrene: Secondary | ICD-10-CM | POA: Insufficient documentation

## 2014-01-18 DIAGNOSIS — M5414 Radiculopathy, thoracic region: Secondary | ICD-10-CM | POA: Insufficient documentation

## 2014-01-18 DIAGNOSIS — K439 Ventral hernia without obstruction or gangrene: Secondary | ICD-10-CM | POA: Insufficient documentation

## 2014-01-18 DIAGNOSIS — M659 Synovitis and tenosynovitis, unspecified: Secondary | ICD-10-CM | POA: Insufficient documentation

## 2014-01-18 DIAGNOSIS — R062 Wheezing: Secondary | ICD-10-CM | POA: Insufficient documentation

## 2014-01-18 DIAGNOSIS — K5150 Left sided colitis without complications: Secondary | ICD-10-CM | POA: Insufficient documentation

## 2014-01-18 DIAGNOSIS — M5417 Radiculopathy, lumbosacral region: Secondary | ICD-10-CM

## 2014-01-18 DIAGNOSIS — D649 Anemia, unspecified: Secondary | ICD-10-CM | POA: Insufficient documentation

## 2014-01-23 ENCOUNTER — Encounter: Payer: Self-pay | Admitting: Podiatrist

## 2014-01-23 ENCOUNTER — Ambulatory Visit (INDEPENDENT_AMBULATORY_CARE_PROVIDER_SITE_OTHER): Payer: Medicare Other | Admitting: Podiatrist

## 2014-01-23 VITALS — BP 137/79 | HR 81 | Resp 18

## 2014-01-23 DIAGNOSIS — M79676 Pain in unspecified toe(s): Secondary | ICD-10-CM

## 2014-01-23 DIAGNOSIS — B351 Tinea unguium: Secondary | ICD-10-CM

## 2014-01-23 DIAGNOSIS — M79609 Pain in unspecified limb: Secondary | ICD-10-CM

## 2014-01-23 NOTE — Progress Notes (Signed)
Patient presents today for continued diabetic foot and nail care. States that the diabetic shoes he has had minimal padding and them. They have double upright brace is attached and we discussed at the last visit sending him to [facility] to have new diabetic shoes attached. No new changes reported.  Physical Exam  GENERAL APPEARANCE: Alert, conversant. Appropriately groomed. No acute distress.  VASCULAR: Pedal pulses intact at 1+ DP and 2+present PT bilateral. Capillary refill time is immediate to all digits, Proximal to distal cooling it warm to warm. Digital hair growth is present bilateral  NEUROLOGIC: sensation is impaired to 5.07 monofilament at 0/5 sites bilateral. Light touch is impaired bilateral, vibratory sensation impaired bilateral, achilles tendon reflex is impaired bilateral.  MUSCULOSKELETAL: intact muscle strength, tone and stability bilateral. Pes planus deformity is noted bilateral. Mild contracture second digits bilateral seen DERMATOLOGIC: skin color, texture, and turger are within normal limits. Mild hyperkeratotic lesion medial aspect first metatarsal head left noted. Digital nails are thickened, discolored, distrophic, mycotic bilateral feet  Assessment & Plan:   Symptomatic mycotic nails , diabetes with neuropathy   Plan: Discussed diabetic foot care and an information and recommendations for foot care dispensed. Nail debridement was carried out without complication. He will be seen back every 3 months for continued care and checks. Will discuss new diabetic shoes in the next visit.

## 2014-01-25 DIAGNOSIS — M5410 Radiculopathy, site unspecified: Secondary | ICD-10-CM | POA: Insufficient documentation

## 2014-04-23 ENCOUNTER — Ambulatory Visit: Payer: Medicare Other

## 2014-04-24 ENCOUNTER — Ambulatory Visit: Payer: Medicare Other | Admitting: Podiatrist

## 2014-05-19 IMAGING — CR DG CHEST 2V
2 series · 2 of 2 positions shown · non-contrast
Comparison: 04/15/2012

CLINICAL DATA: Preop lumbar surgery.  Hypertension, diabetes.

CHEST - 2 VIEW

[view not recorded (1 of 2)]
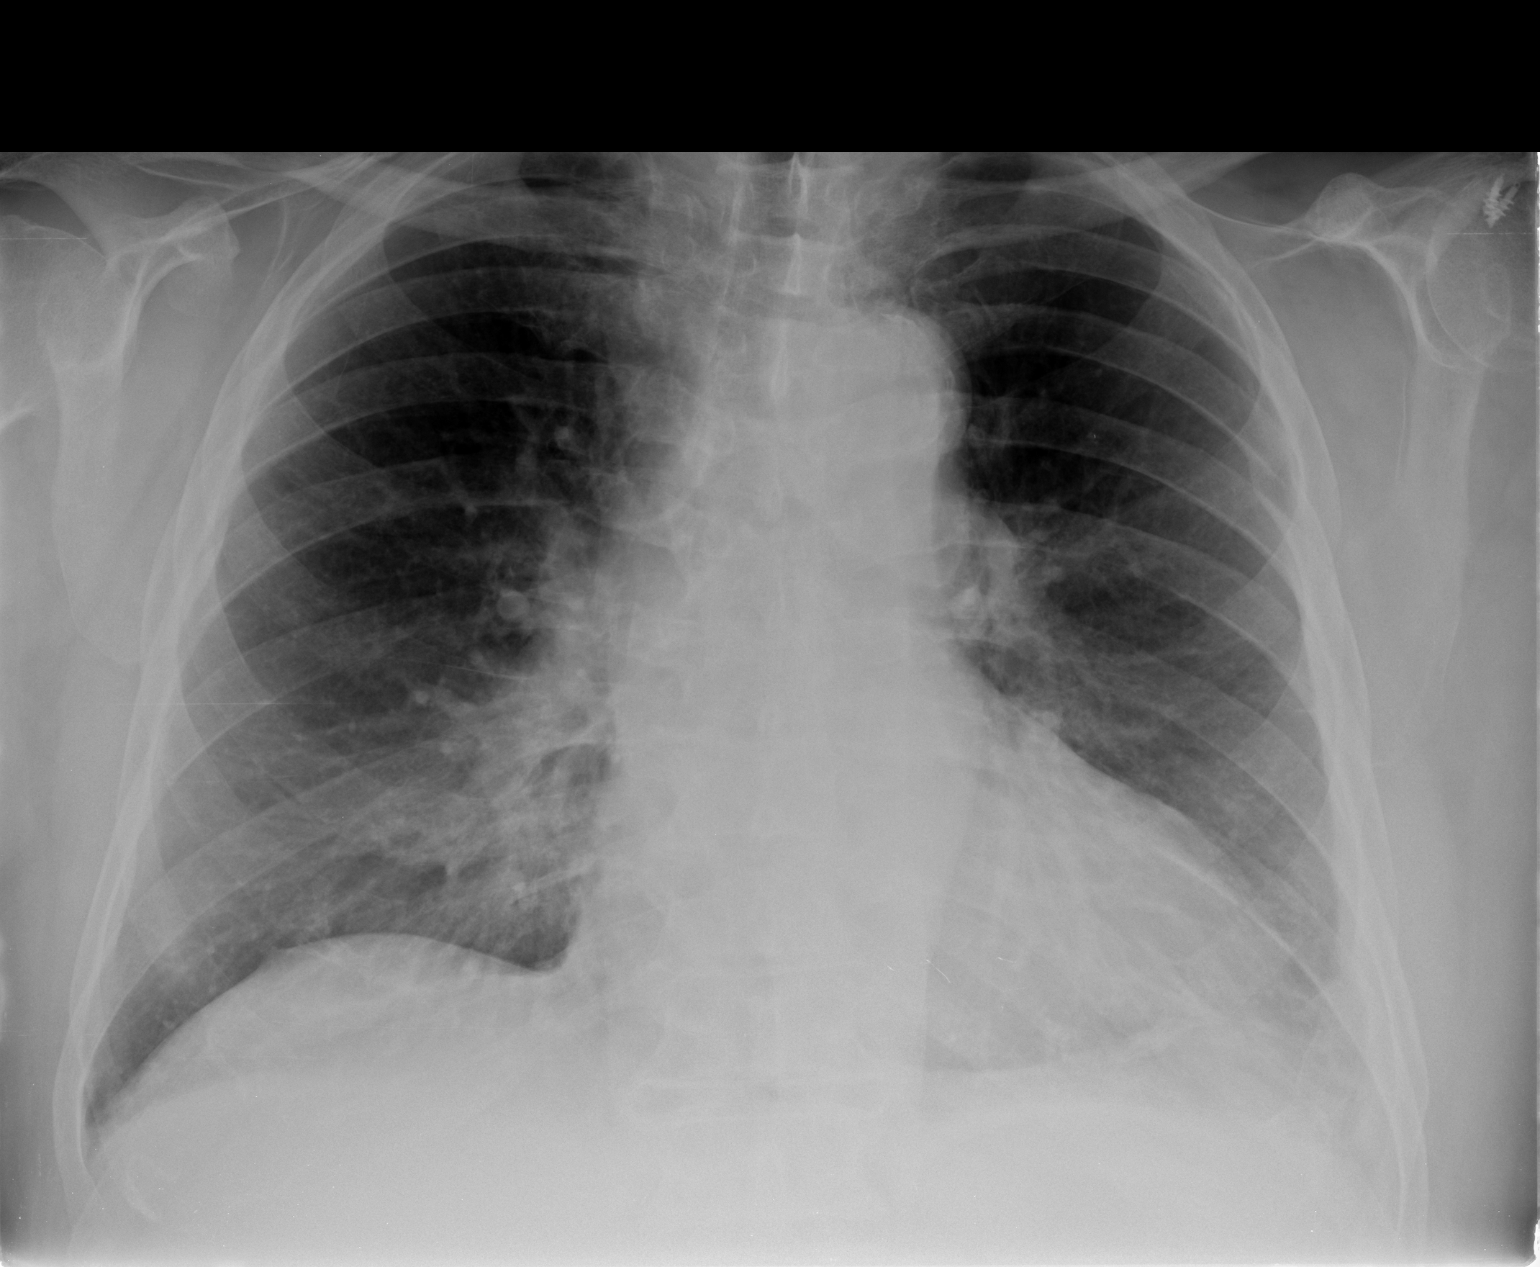

[view not recorded (2 of 2)]
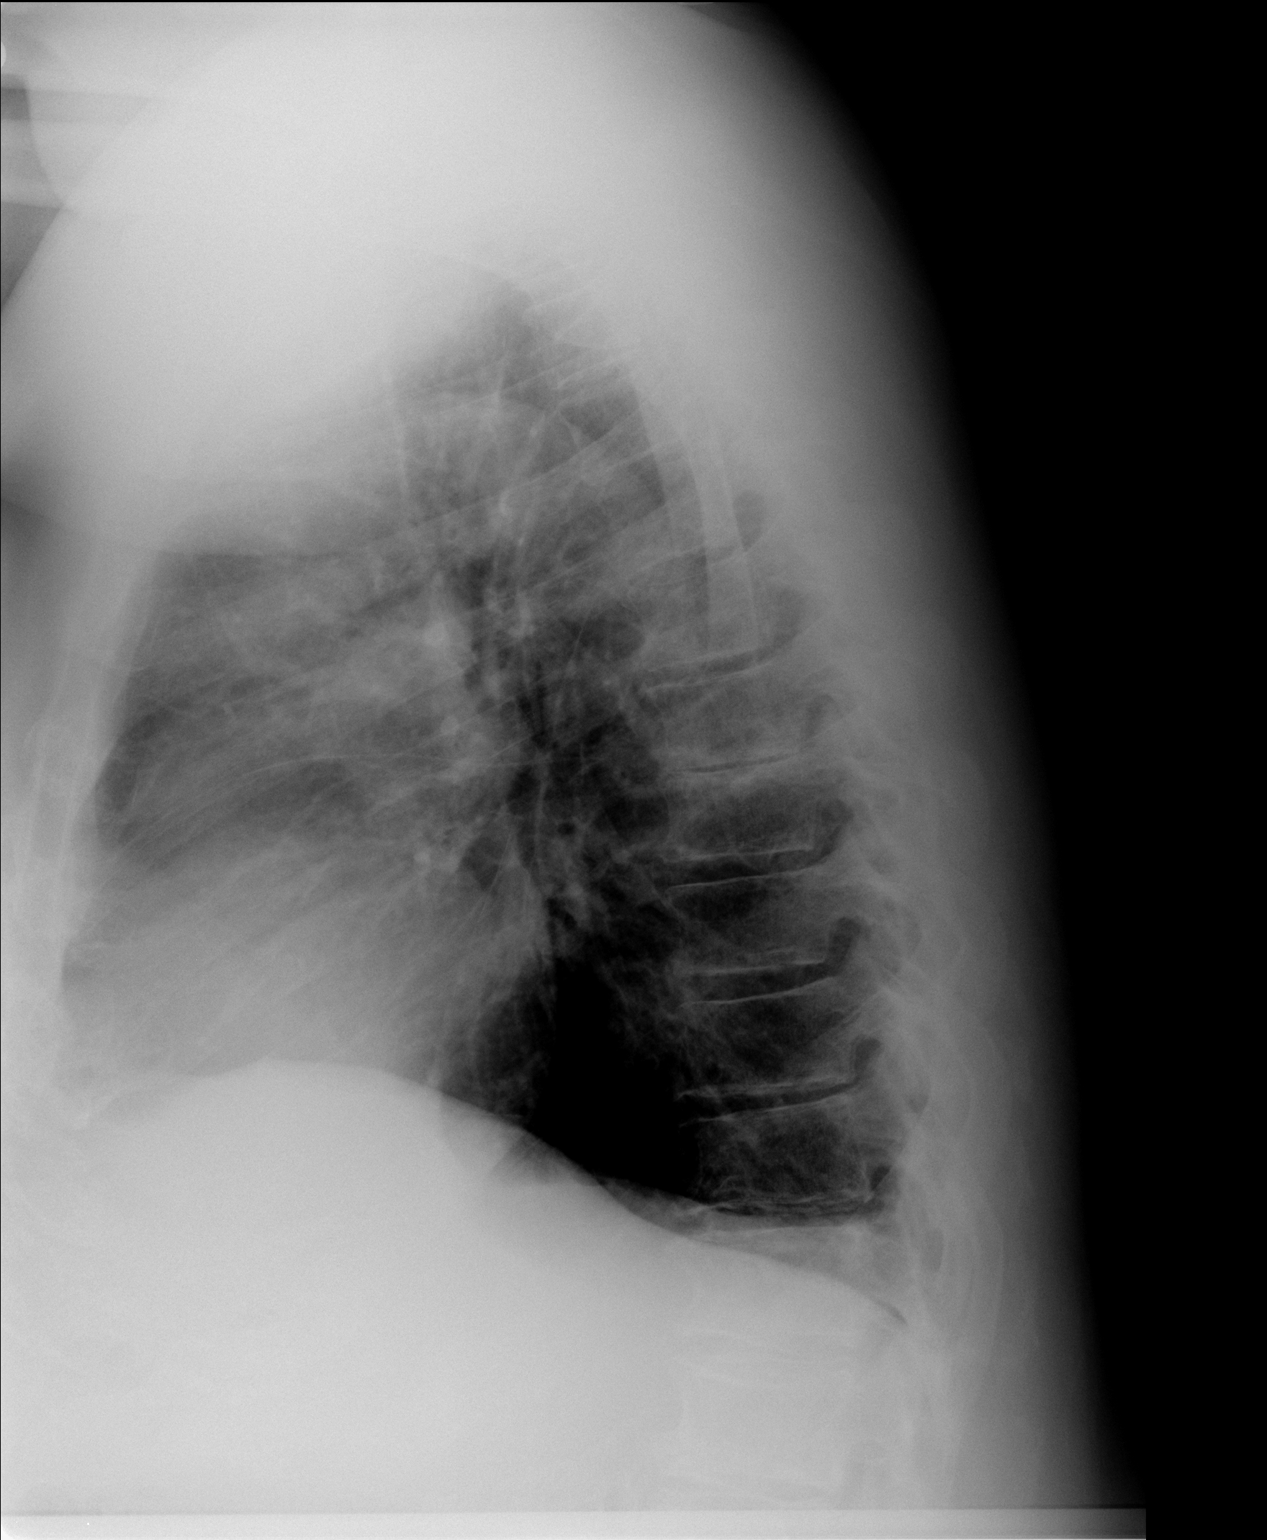

[2 of 2 positions shown; findings below may reference images not displayed]

FINDINGS: The heart is borderline in size.  Mild vascular
congestion.  Mild biapical pleural thickening, stable.  Stable
prominence of the markings in the right perihilar region.  Lingular
scarring, stable.  No effusions or acute infiltrates.  No acute
bony abnormality.
IMPRESSION: Stable chronic changes.  No acute findings.

## 2014-05-19 IMAGING — CR DG CHEST 1V PORT
1 series · 1 of 1 positions shown · non-contrast
Comparison: 5368 hours the same day and earlier.

CLINICAL DATA: 81-year-old male line placement.

PORTABLE CHEST - 1 VIEW

[AP]
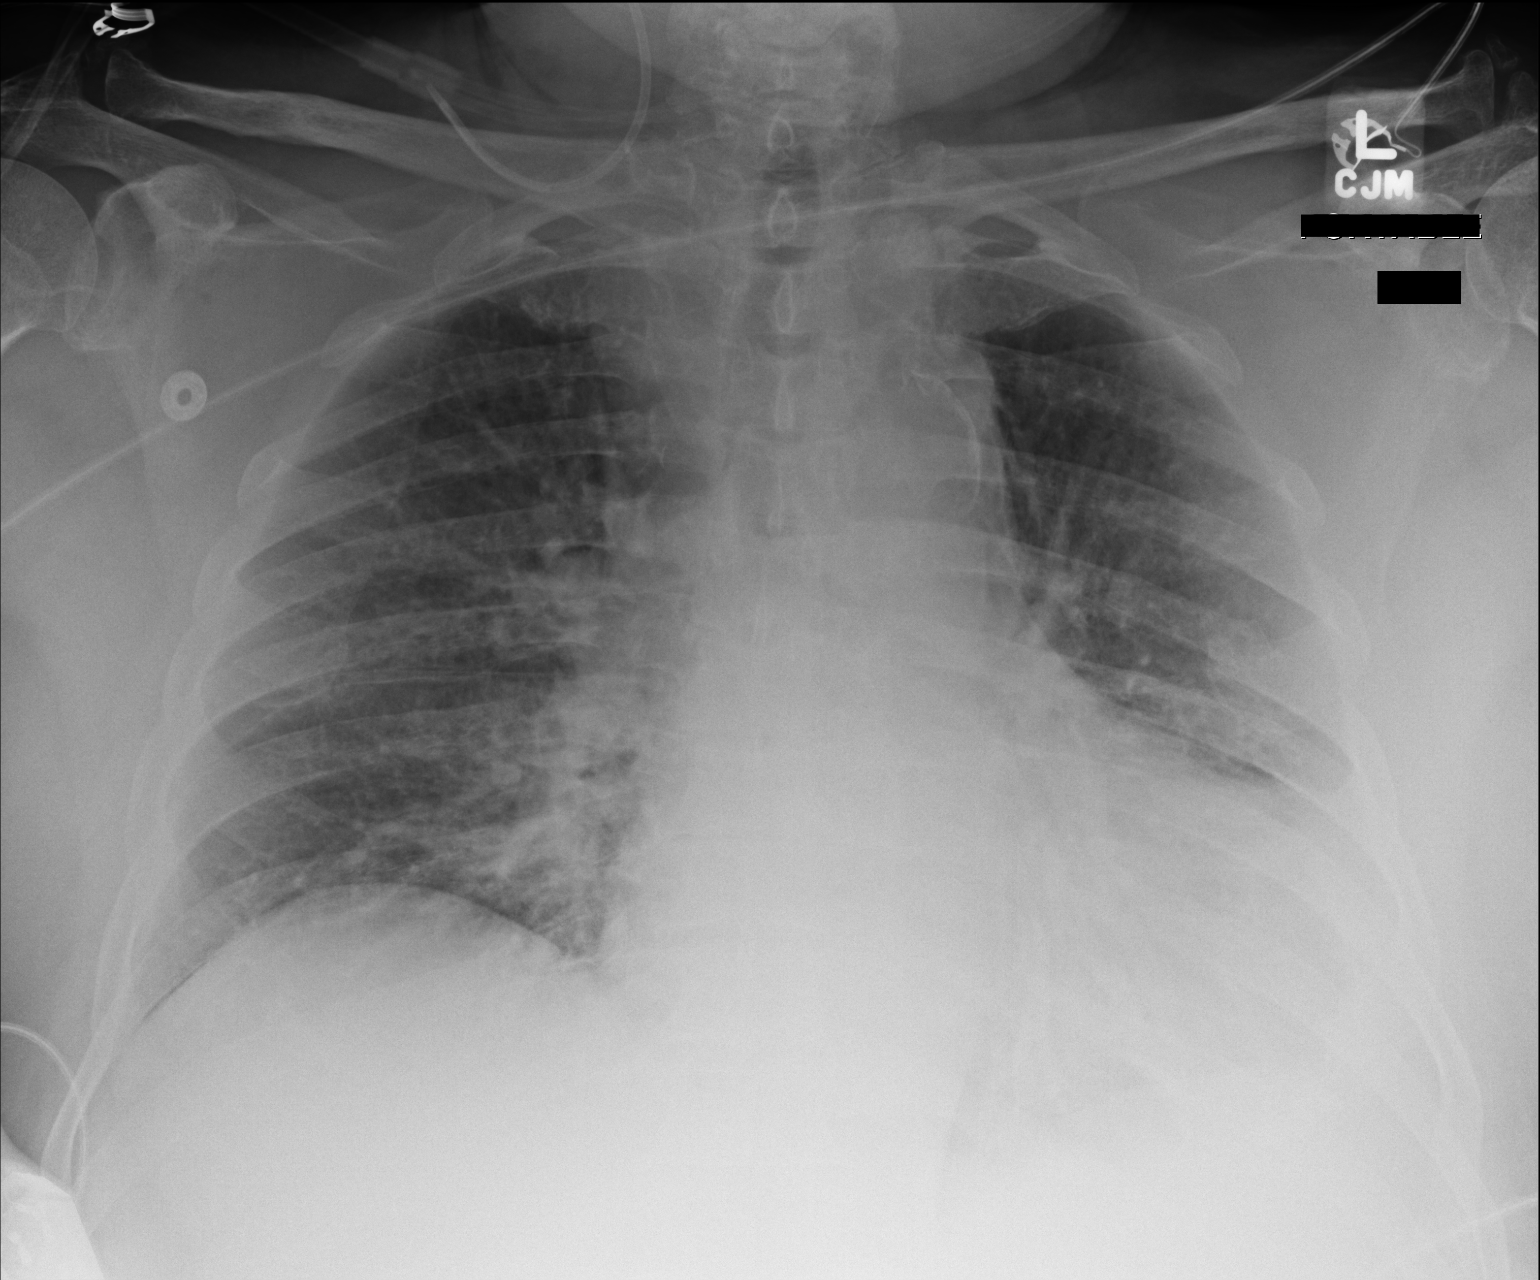

[1 of 1 positions shown; findings below may reference images not displayed]

FINDINGS: Portable semi upright AP view 5074 hours.

Curve plastic catheter projects over the right base of the neck and
clavicle.  No other central line identified.

Stable cardiomegaly and mediastinal contours.  No pneumothorax.
Mildly increased retrocardiac opacity.  Increased vascular
congestion, no definite overt pulmonary edema.
IMPRESSION: 1.  Malpositioned catheter projecting at the right lower neck/upper
chest.  Probably an IJ approach line which has coursed into a
superficial vein.  Recommend removal and repositioning.
2.  Increased vascular congestion and retrocardiac atelectasis.

#1 discussed with Dr. Jandson Absolon by telephone on 06/20/12 at
1911 hours.

## 2014-05-30 DIAGNOSIS — G5600 Carpal tunnel syndrome, unspecified upper limb: Secondary | ICD-10-CM | POA: Insufficient documentation

## 2014-05-30 DIAGNOSIS — G5620 Lesion of ulnar nerve, unspecified upper limb: Secondary | ICD-10-CM | POA: Insufficient documentation

## 2014-08-02 DIAGNOSIS — M25519 Pain in unspecified shoulder: Secondary | ICD-10-CM | POA: Insufficient documentation

## 2014-08-02 DIAGNOSIS — Z01818 Encounter for other preprocedural examination: Secondary | ICD-10-CM | POA: Insufficient documentation

## 2014-08-02 DIAGNOSIS — M25539 Pain in unspecified wrist: Secondary | ICD-10-CM | POA: Insufficient documentation

## 2014-08-22 DIAGNOSIS — Z4889 Encounter for other specified surgical aftercare: Secondary | ICD-10-CM | POA: Insufficient documentation

## 2014-09-12 DIAGNOSIS — L03119 Cellulitis of unspecified part of limb: Secondary | ICD-10-CM | POA: Insufficient documentation

## 2015-01-09 ENCOUNTER — Ambulatory Visit (INDEPENDENT_AMBULATORY_CARE_PROVIDER_SITE_OTHER): Payer: Medicare Other | Admitting: Podiatry

## 2015-01-09 ENCOUNTER — Encounter: Payer: Self-pay | Admitting: Podiatry

## 2015-01-09 DIAGNOSIS — B351 Tinea unguium: Secondary | ICD-10-CM

## 2015-01-09 DIAGNOSIS — M79676 Pain in unspecified toe(s): Secondary | ICD-10-CM | POA: Diagnosis not present

## 2015-01-09 NOTE — Progress Notes (Signed)
Patient presents today for continued diabetic foot and nail care. States that the diabetic shoes he has had minimal padding and them. They have double upright brace is attached . No new changes reported.  Physical Exam  GENERAL APPEARANCE: Alert, conversant. Appropriately groomed. No acute distress.  VASCULAR: Pedal pulses intact at 1+ DP and 2+present PT bilateral. Capillary refill time is immediate to all digits, Proximal to distal cooling it warm to warm. Digital hair growth is present bilateral  NEUROLOGIC: sensation is impaired to 5.07 monofilament at 0/5 sites bilateral. Light touch is impaired bilateral, vibratory sensation impaired bilateral, achilles tendon reflex is impaired bilateral.  MUSCULOSKELETAL: intact muscle strength, tone and stability bilateral. Pes planus deformity is noted bilateral. Mild contracture second digits bilateral seen DERMATOLOGIC: skin color, texture, and turger are within normal limits. Mild hyperkeratotic lesion medial aspect first metatarsal head left noted. Digital nails are thickened, discolored, distrophic, mycotic bilateral feet  Assessment & Plan:   Symptomatic mycotic nails , diabetes with neuropathy   Plan: Discussed diabetic foot care and an information and recommendations for foot care dispensed. Nail debridement was carried out without complication. He will be seen back every 3 months for continued care and checks. Will discuss new diabetic shoes in the next visit.

## 2015-04-10 ENCOUNTER — Ambulatory Visit: Payer: Medicare Other | Admitting: Sports Medicine

## 2015-04-10 ENCOUNTER — Ambulatory Visit: Payer: Medicare Other | Admitting: Podiatry

## 2015-04-11 ENCOUNTER — Ambulatory Visit: Payer: Medicare Other | Admitting: Sports Medicine

## 2015-06-04 DIAGNOSIS — K5190 Ulcerative colitis, unspecified, without complications: Secondary | ICD-10-CM | POA: Insufficient documentation

## 2015-06-04 DIAGNOSIS — I5030 Unspecified diastolic (congestive) heart failure: Secondary | ICD-10-CM | POA: Insufficient documentation

## 2015-06-04 DIAGNOSIS — I4891 Unspecified atrial fibrillation: Secondary | ICD-10-CM | POA: Insufficient documentation

## 2015-06-04 DIAGNOSIS — E7800 Pure hypercholesterolemia, unspecified: Secondary | ICD-10-CM | POA: Insufficient documentation

## 2015-06-04 DIAGNOSIS — K219 Gastro-esophageal reflux disease without esophagitis: Secondary | ICD-10-CM | POA: Insufficient documentation

## 2015-06-04 DIAGNOSIS — J45909 Unspecified asthma, uncomplicated: Secondary | ICD-10-CM | POA: Insufficient documentation

## 2015-06-04 DIAGNOSIS — I10 Essential (primary) hypertension: Secondary | ICD-10-CM | POA: Insufficient documentation

## 2015-08-28 ENCOUNTER — Encounter: Payer: Self-pay | Admitting: Sports Medicine

## 2015-08-28 ENCOUNTER — Ambulatory Visit (INDEPENDENT_AMBULATORY_CARE_PROVIDER_SITE_OTHER): Payer: Medicare Other | Admitting: Sports Medicine

## 2015-08-28 DIAGNOSIS — M21371 Foot drop, right foot: Secondary | ICD-10-CM

## 2015-08-28 DIAGNOSIS — B351 Tinea unguium: Secondary | ICD-10-CM

## 2015-08-28 DIAGNOSIS — E1142 Type 2 diabetes mellitus with diabetic polyneuropathy: Secondary | ICD-10-CM | POA: Diagnosis not present

## 2015-08-28 DIAGNOSIS — M79676 Pain in unspecified toe(s): Secondary | ICD-10-CM | POA: Diagnosis not present

## 2015-08-28 DIAGNOSIS — M21372 Foot drop, left foot: Secondary | ICD-10-CM

## 2015-08-28 NOTE — Progress Notes (Signed)
Patient ID: Phillip Chandler, male   DOB: 08/23/1930, 82 y.o.   MRN: [id number] Subjective: Phillip Chandler is a 82 y.o. male patient with history of diabetes who presents to office today complaining of long, painful nails  while ambulating in shoes; unable to trim. Patient states that the glucose reading this morning was 187 mg/dl. Patient denies any new changes in medication or new problems. Patient denies any new cramping, numbness, burning or tingling in the legs. Admits to baseline numbness, tingling, burning in feet and legs that are unchanged from prior. Patient also admits drop foot secondary to significant spinal surgery with rods in his back. No other pedal complaints.  Patient is assisted by daughter at this visit, who comes from [place] and tends to bring him to his doctor's appointments.   Patient Active Problem List   Diagnosis Date Noted  . Acid reflux 06/04/2015  . Airway hyperreactivity 06/04/2015  . Atrial fibrillation ([place]) 06/04/2015  . Ulcerative colitis ([place]) 06/04/2015  . Diastolic heart failure ([place]) 06/04/2015  . BP (high blood pressure) 06/04/2015  . Pure hypercholesterolemia 06/04/2015  . Cellulitis of extremity 09/12/2014  . Encounter for surgical follow-up care 08/22/2014  . Pain in shoulder 08/02/2014  . Pain in the wrist 08/02/2014  . Encounter for other preprocedural examination 08/02/2014  . Carpal tunnel syndrome 05/30/2014  . Entrapment of ulnar nerve 05/30/2014  . Nerve root pain 01/25/2014  . Absolute anemia 01/18/2014  . Brachial plexus injury 01/18/2014  . Rupture long head biceps tendon 01/18/2014  . Degenerative arthritis of spine 01/18/2014  . Ecchymoses, spontaneous 01/18/2014  . Synovitis or tenosynovitis of wrist 01/18/2014  . Thoracic and lumbosacral neuritis 01/18/2014  . Left sided ulcerative colitis ([place]) 01/18/2014  . Neuropathic ulnar nerve 01/18/2014  . Umbilical hernia without obstruction and without gangrene 01/18/2014  . Hernia of  anterior abdominal wall 01/18/2014  . Asthmatic breathing 01/18/2014  . Allergic rhinitis 01/17/2014  . Benign essential HTN 01/17/2014  . Callosity 01/17/2014  . Carotid artery narrowing 01/17/2014  . Chronic cerebral ischemia 01/17/2014  . Chronic obstructive pulmonary disease ([place]) 01/17/2014  . Congestive heart failure ([place]) 01/17/2014  . Cell phone elbow 01/17/2014  . Degeneration of intervertebral disc of lumbar region 01/17/2014  . Dermatophytic onychia 01/17/2014  . Dermatitis, eczematoid 01/17/2014  . Effusion into joint 01/17/2014  . Enthesopathy of hip 01/17/2014  . Asthma, exogenous 01/17/2014  . Adynamia 01/17/2014  . High potassium 01/17/2014  . HLD (hyperlipidemia) 01/17/2014  . Decreased potassium in the blood 01/17/2014  . Anemia, iron deficiency 01/17/2014  . Ache in joint 01/17/2014  . LBP (low back pain) 01/17/2014  . Edema leg 01/17/2014  . Lumbar canal stenosis 01/17/2014  . Morbid obesity ([place]) 01/17/2014  . Breathlessness lying flat 01/17/2014  . Arthritis of knee, degenerative 01/17/2014  . Arthralgia of hand 01/17/2014  . Arthralgia of hip or thigh 01/17/2014  . Arthralgia of shoulder 01/17/2014  . Peripheral vascular disease ([place]) 01/17/2014  . PNA (pneumonia) 01/17/2014  . Chronic restrictive lung disease 01/17/2014  . Breath shortness 01/17/2014  . Acute diastolic heart failure ([place]) 06/21/2012  . Anxiety 06/21/2012  . Stroke ([place]) 06/21/2012  . GERD (gastroesophageal reflux disease) 06/21/2012  . Cerebrovascular accident (CVA) ([place]) 06/21/2012  . Degenerative spondylolisthesis 06/20/2012  . Diabetes mellitus ([place]) 06/20/2012  . HTN (hypertension) 06/20/2012  . Atrial fibrillation with rapid ventricular response ([place]) 06/20/2012   Current Outpatient Prescriptions on File Prior to Visit  Medication Sig Dispense Refill  .  albuterol (PROAIR HFA) 108 (90 BASE) MCG/ACT inhaler Inhale into the lungs.    Marland Kitchen aliskiren (TEKTURNA) 300 MG tablet Take  300 mg by mouth daily.    Marland Kitchen amiodarone (PACERONE) 200 MG tablet Take 200 mg by mouth daily.    Marland Kitchen amiodarone (PACERONE) 200 MG tablet Take 1 tablet (200 mg total) by mouth 2 (two) times daily. 60 tablet 1  . ammonium lactate (AMLACTIN) 12 % cream Take as directed.    . betamethasone valerate lotion (VALISONE) 0.1 % Take as directed.    . cetirizine (ZYRTEC) 10 MG tablet Take 10 mg by mouth daily.    . cetirizine (ZYRTEC) 10 MG tablet Take 10 mg by mouth.    . cloNIDine (CATAPRES) 0.1 MG tablet Take 0.1 mg by mouth 2 (two) times daily.    . cloNIDine (CATAPRES) 0.1 MG tablet Take 0.1 mg by mouth.    . digoxin (LANOXIN) 0.125 MG tablet Take by mouth daily. Not sure of the mg per patient/lc    . digoxin (LANOXIN) 0.125 MG tablet Take by mouth.    . fluticasone (FLONASE) 50 MCG/ACT nasal spray Place 2 sprays into the nose daily.    . fluticasone (FLONASE) 50 MCG/ACT nasal spray 2 sprays by Each Nare route daily.    . furosemide (LASIX) 20 MG tablet Take 20 mg by mouth 2 (two) times daily.    . furosemide (LASIX) 80 MG tablet Take 80 mg by mouth.    . furosemide (LASIX) 80 MG tablet Take 80 mg by mouth.    Marland Kitchen HYDROcodone-acetaminophen (NORCO) 7.5-325 MG per tablet Take 1 tablet by mouth every 4 (four) hours as needed. For pain    . HYDROcodone-acetaminophen (NORCO/VICODIN) 5-325 MG per tablet Take by mouth.    Marland Kitchen HYDROcodone-acetaminophen (NORCO/VICODIN) 5-325 MG per tablet Take by mouth.    . labetalol (NORMODYNE) 200 MG tablet Take 200 mg by mouth 2 (two) times daily.    Marland Kitchen labetalol (NORMODYNE) 200 MG tablet Take 100 mg by mouth.    . labetalol (NORMODYNE) 200 MG tablet TAKE ONE-HALF TABLET BY MOUTH TWICE DAILY    . labetalol (NORMODYNE) 300 MG tablet Take 1 tablet (300 mg total) by mouth 2 (two) times daily. 60 tablet 1  . loratadine (CLARITIN) 10 MG tablet Take 10 mg by mouth.    . losartan (COZAAR) 100 MG tablet Take 100 mg by mouth.    . losartan (COZAAR) 25 MG tablet Take 25 mg by mouth  daily. Not sure of the mg per patient/lc    . mesalamine (LIALDA) 1.2 G EC tablet Take 1,200 mg by mouth daily with breakfast.    . mesalamine (LIALDA) 1.2 G EC tablet Take 1,200 mg by mouth.    . metFORMIN (GLUCOPHAGE-XR) 500 MG 24 hr tablet Take 500 mg by mouth.    . metFORMIN (GLUMETZA) 500 MG (MOD) 24 hr tablet Take 500 mg by mouth daily with breakfast.    . methocarbamol (ROBAXIN) 750 MG tablet Take 750 mg by mouth.    . montelukast (SINGULAIR) 10 MG tablet Take 10 mg by mouth at bedtime.    . montelukast (SINGULAIR) 10 MG tablet Take 10 mg by mouth.    . Omega-3 Fatty Acids (FISH OIL) 1000 MG CAPS Take as directed.    Marland Kitchen omeprazole (PRILOSEC) 40 MG capsule Take 40 mg by mouth daily.    Marland Kitchen omeprazole (PRILOSEC) 40 MG capsule Take 40 mg by mouth.    . potassium chloride SA (K-DUR,KLOR-CON) 20  MEQ tablet Take 20 mEq by mouth 2 (two) times daily. Not sure of the mg per patient/lc    . potassium chloride SA (KLOR-CON M20) 20 MEQ tablet Take by mouth.    . pregabalin (LYRICA) 100 MG capsule Take 100 mg by mouth 4 (four) times daily.    . pregabalin (LYRICA) 100 MG capsule Take 100 mg by mouth.    . Rivaroxaban (XARELTO PO) Take by mouth.    . Rivaroxaban (XARELTO) 15 MG TABS tablet Take 15 mg by mouth.    . rivaroxaban (XARELTO) 20 MG TABS tablet Take 20 mg by mouth.    . simvastatin (ZOCOR) 40 MG tablet Take 40 mg by mouth every evening.    . simvastatin (ZOCOR) 40 MG tablet Take 20 mg by mouth.    . sitaGLIPtin-metformin (JANUMET) 50-500 MG per tablet Take by mouth.    . spironolactone (ALDACTONE) 25 MG tablet Take 25 mg by mouth daily. Not sure of the mg per patient/lc    . spironolactone (ALDACTONE) 25 MG tablet Take 12.5 mg by mouth.     No current facility-administered medications on file prior to visit.   Allergies  Allergen Reactions  . Hydromorphone Anaphylaxis  . Fentanyl Other (See Comments)    Personality change- cried a lot. Personality change- cried a lot.  . Versed  [Midazolam] Other (See Comments)    Other reaction(s): Other (See Comments) Can in personality. Can in personality.  . Canagliflozin-Metformin Hcl Other (See Comments)  . Nsaids Other (See Comments)    unknown  . Pneumococcal Polysaccharide Vaccine Swelling  . Pneumococcal Vac Polyvalent Swelling  . Pneumococcal Vaccine     Other reaction(s): SWELLING  . Sulfa Antibiotics Other (See Comments)    unknown hives  . Altace [Ramipril] Rash    Shortness of breath    No results found for this or any previous visit (from the past 2160 hour(s)).  Objective: General: Patient is awake, alert, and oriented x 3 and in no acute distress. Ambulates with assistance of a rolling walker and bilateral braces.  Integument: Skin is warm, dry and supple bilateral. Nails are tender, long, thickened and  dystrophic with subungual debris, consistent with onychomycosis, 1-5 bilateral. No signs of infection. No open lesions or preulcerative lesions present bilateral. Minimal keratosis to medial aspect of left first metatarsophalangeal joint with no signs of infection. Remaining integument unremarkable.  Vasculature:  Dorsalis Pedis pulse 1/4 bilateral. Posterior Tibial pulse  1/4 bilateral.  Capillary fill time <3 sec 1-5 bilateral. Scant hair growth to the level of the digits. Temperature gradient within normal limits. No varicosities present bilateral. No edema present bilateral.   Neurology: The patient has absent sensation measured with a 5.07/10g Semmes Weinstein Monofilament at all pedal sites bilateral . Vibratory sensation absent bilateral with tuning fork. No Babinski sign present bilateral.   Musculoskeletal: Planus and digital contracture secondary to dropfoot noted bilateral. Muscular strength 4/5 in all lower extremity muscular groups bilateral without pain on range of motion . No tenderness with calf compression bilateral.  Assessment and Plan: Problem List Items Addressed This Visit    None     Visit Diagnoses    Pain due to onychomycosis of toenail    -  Primary    Diabetic polyneuropathy associated with type 2 diabetes mellitus (HCC)        Foot drop, bilateral          -Examined patient. -Discussed and educated patient on diabetic foot care, especially with  regards to the vascular, neurological and musculoskeletal systems.  -Stressed the importance of good glycemic control and the detriment of not  controlling glucose levels in relation to the foot. -Mechanically debrided all nails 1-5 bilateral using sterile nail nipper and filed with dremel without incident  -Recommend continue with bilateral braces for foot drop, good supportive shoes, and rolling walker for assistance with stability in gait.  -Answered all patient questions -Patient to return in 3 months for at risk foot care -Patient advised to call the office if any problems or questions arise in the meantime.  Landis Martins, DPM

## 2015-11-28 ENCOUNTER — Ambulatory Visit: Payer: Medicare Other | Admitting: Sports Medicine

## 2016-01-08 ENCOUNTER — Encounter: Payer: Self-pay | Admitting: Sports Medicine

## 2016-01-08 ENCOUNTER — Ambulatory Visit (INDEPENDENT_AMBULATORY_CARE_PROVIDER_SITE_OTHER): Payer: Medicare Other | Admitting: Sports Medicine

## 2016-01-08 DIAGNOSIS — M79676 Pain in unspecified toe(s): Secondary | ICD-10-CM | POA: Diagnosis not present

## 2016-01-08 DIAGNOSIS — M21372 Foot drop, left foot: Secondary | ICD-10-CM

## 2016-01-08 DIAGNOSIS — B351 Tinea unguium: Secondary | ICD-10-CM | POA: Diagnosis not present

## 2016-01-08 DIAGNOSIS — M21371 Foot drop, right foot: Secondary | ICD-10-CM

## 2016-01-08 DIAGNOSIS — E1142 Type 2 diabetes mellitus with diabetic polyneuropathy: Secondary | ICD-10-CM | POA: Diagnosis not present

## 2016-01-08 NOTE — Progress Notes (Signed)
Patient ID: Phillip Chandler, male   DOB: [birth year], 82 y.o.   MRN: [id number] Subjective: Phillip Chandler is a 82 y.o. male patient with history of diabetes who presents to office today complaining of long, painful nails  while ambulating in shoes; unable to trim. Patient states that the glucose reading this morning was 190. Patient denies any new changes in medication or new problems. Patient denies any new cramping, numbness, burning or tingling in the legs. Admits to baseline numbness, tingling, burning in feet and legs that are unchanged from prior. No other pedal complaints.  Patient is assisted by son this visit.    Patient Active Problem List   Diagnosis Date Noted  . Acid reflux 06/04/2015  . Airway hyperreactivity 06/04/2015  . Atrial fibrillation ([place]) 06/04/2015  . Ulcerative colitis ([place]) 06/04/2015  . Diastolic heart failure ([place]) 06/04/2015  . BP (high blood pressure) 06/04/2015  . Pure hypercholesterolemia 06/04/2015  . Cellulitis of extremity 09/12/2014  . Encounter for surgical follow-up care 08/22/2014  . Pain in shoulder 08/02/2014  . Pain in the wrist 08/02/2014  . Encounter for other preprocedural examination 08/02/2014  . Carpal tunnel syndrome 05/30/2014  . Entrapment of ulnar nerve 05/30/2014  . Nerve root pain 01/25/2014  . Absolute anemia 01/18/2014  . Brachial plexus injury 01/18/2014  . Rupture long head biceps tendon 01/18/2014  . Degenerative arthritis of spine 01/18/2014  . Ecchymoses, spontaneous 01/18/2014  . Synovitis or tenosynovitis of wrist 01/18/2014  . Thoracic and lumbosacral neuritis 01/18/2014  . Left sided ulcerative colitis ([employer]) 01/18/2014  . Neuropathic ulnar nerve 01/18/2014  . Umbilical hernia without obstruction and without gangrene 01/18/2014  . Hernia of anterior abdominal wall 01/18/2014  . Asthmatic breathing 01/18/2014  . Allergic rhinitis 01/17/2014  . Benign essential HTN 01/17/2014  . Callosity 01/17/2014  . Carotid artery  narrowing 01/17/2014  . Chronic cerebral ischemia 01/17/2014  . Chronic obstructive pulmonary disease ([place]) 01/17/2014  . Congestive heart failure ([place]) 01/17/2014  . Cell phone elbow 01/17/2014  . Degeneration of intervertebral disc of lumbar region 01/17/2014  . Dermatophytic onychia 01/17/2014  . Dermatitis, eczematoid 01/17/2014  . Effusion into joint 01/17/2014  . Enthesopathy of hip 01/17/2014  . Asthma, exogenous 01/17/2014  . Adynamia 01/17/2014  . High potassium 01/17/2014  . HLD (hyperlipidemia) 01/17/2014  . Decreased potassium in the blood 01/17/2014  . Anemia, iron deficiency 01/17/2014  . Ache in joint 01/17/2014  . LBP (low back pain) 01/17/2014  . Edema leg 01/17/2014  . Lumbar canal stenosis 01/17/2014  . Morbid obesity ([place]) 01/17/2014  . Breathlessness lying flat 01/17/2014  . Arthritis of knee, degenerative 01/17/2014  . Arthralgia of hand 01/17/2014  . Arthralgia of hip or thigh 01/17/2014  . Arthralgia of shoulder 01/17/2014  . Peripheral vascular disease ([place]) 01/17/2014  . PNA (pneumonia) 01/17/2014  . Chronic restrictive lung disease 01/17/2014  . Breath shortness 01/17/2014  . Acute diastolic heart failure ([place]) 06/21/2012  . Anxiety 06/21/2012  . Stroke ([place]) 06/21/2012  . GERD (gastroesophageal reflux disease) 06/21/2012  . Cerebrovascular accident (CVA) ([place]) 06/21/2012  . Degenerative spondylolisthesis 06/20/2012  . Diabetes mellitus ([place]) 06/20/2012  . HTN (hypertension) 06/20/2012  . Atrial fibrillation with rapid ventricular response ([place]) 06/20/2012   Current Outpatient Prescriptions on File Prior to Visit  Medication Sig Dispense Refill  . albuterol (PROAIR HFA) 108 (90 BASE) MCG/ACT inhaler Inhale into the lungs.    Marland Kitchen aliskiren (TEKTURNA) 300 MG tablet Take 300 mg by mouth daily.    Marland Kitchen  amiodarone (PACERONE) 200 MG tablet Take 200 mg by mouth daily.    Marland Kitchen amiodarone (PACERONE) 200 MG tablet Take 1 tablet (200 mg total) by mouth 2 (two)  times daily. 60 tablet 1  . ammonium lactate (AMLACTIN) 12 % cream Take as directed.    . betamethasone valerate lotion (VALISONE) 0.1 % Take as directed.    . cetirizine (ZYRTEC) 10 MG tablet Take 10 mg by mouth daily.    . cetirizine (ZYRTEC) 10 MG tablet Take 10 mg by mouth.    . cloNIDine (CATAPRES) 0.1 MG tablet Take 0.1 mg by mouth 2 (two) times daily.    . cloNIDine (CATAPRES) 0.1 MG tablet Take 0.1 mg by mouth.    . digoxin (LANOXIN) 0.125 MG tablet Take by mouth daily. Not sure of the mg per patient/lc    . digoxin (LANOXIN) 0.125 MG tablet Take by mouth.    . fluticasone (FLONASE) 50 MCG/ACT nasal spray Place 2 sprays into the nose daily.    . fluticasone (FLONASE) 50 MCG/ACT nasal spray 2 sprays by Each Nare route daily.    . furosemide (LASIX) 20 MG tablet Take 20 mg by mouth 2 (two) times daily.    . furosemide (LASIX) 80 MG tablet Take 80 mg by mouth.    . furosemide (LASIX) 80 MG tablet Take 80 mg by mouth.    Marland Kitchen HYDROcodone-acetaminophen (NORCO) 7.5-325 MG per tablet Take 1 tablet by mouth every 4 (four) hours as needed. For pain    . HYDROcodone-acetaminophen (NORCO/VICODIN) 5-325 MG per tablet Take by mouth.    Marland Kitchen HYDROcodone-acetaminophen (NORCO/VICODIN) 5-325 MG per tablet Take by mouth.    . labetalol (NORMODYNE) 200 MG tablet Take 200 mg by mouth 2 (two) times daily.    Marland Kitchen labetalol (NORMODYNE) 200 MG tablet Take 100 mg by mouth.    . labetalol (NORMODYNE) 200 MG tablet TAKE ONE-HALF TABLET BY MOUTH TWICE DAILY    . labetalol (NORMODYNE) 300 MG tablet Take 1 tablet (300 mg total) by mouth 2 (two) times daily. 60 tablet 1  . loratadine (CLARITIN) 10 MG tablet Take 10 mg by mouth.    . losartan (COZAAR) 100 MG tablet Take 100 mg by mouth.    . losartan (COZAAR) 25 MG tablet Take 25 mg by mouth daily. Not sure of the mg per patient/lc    . mesalamine (LIALDA) 1.2 G EC tablet Take 1,200 mg by mouth daily with breakfast.    . mesalamine (LIALDA) 1.2 G EC tablet Take 1,200 mg by  mouth.    . metFORMIN (GLUCOPHAGE-XR) 500 MG 24 hr tablet Take 500 mg by mouth.    . metFORMIN (GLUMETZA) 500 MG (MOD) 24 hr tablet Take 500 mg by mouth daily with breakfast.    . methocarbamol (ROBAXIN) 750 MG tablet Take 750 mg by mouth.    . montelukast (SINGULAIR) 10 MG tablet Take 10 mg by mouth at bedtime.    . montelukast (SINGULAIR) 10 MG tablet Take 10 mg by mouth.    . Omega-3 Fatty Acids (FISH OIL) 1000 MG CAPS Take as directed.    Marland Kitchen omeprazole (PRILOSEC) 40 MG capsule Take 40 mg by mouth daily.    Marland Kitchen omeprazole (PRILOSEC) 40 MG capsule Take 40 mg by mouth.    . potassium chloride SA (K-DUR,KLOR-CON) 20 MEQ tablet Take 20 mEq by mouth 2 (two) times daily. Not sure of the mg per patient/lc    . potassium chloride SA (KLOR-CON M20) 20 MEQ tablet Take  by mouth.    . pregabalin (LYRICA) 100 MG capsule Take 100 mg by mouth 4 (four) times daily.    . pregabalin (LYRICA) 100 MG capsule Take 100 mg by mouth.    . Rivaroxaban (XARELTO PO) Take by mouth.    . Rivaroxaban (XARELTO) 15 MG TABS tablet Take 15 mg by mouth.    . rivaroxaban (XARELTO) 20 MG TABS tablet Take 20 mg by mouth.    . simvastatin (ZOCOR) 40 MG tablet Take 40 mg by mouth every evening.    . simvastatin (ZOCOR) 40 MG tablet Take 20 mg by mouth.    . sitaGLIPtin-metformin (JANUMET) 50-500 MG per tablet Take by mouth.    . spironolactone (ALDACTONE) 25 MG tablet Take 25 mg by mouth daily. Not sure of the mg per patient/lc    . spironolactone (ALDACTONE) 25 MG tablet Take 12.5 mg by mouth.     No current facility-administered medications on file prior to visit.    Allergies  Allergen Reactions  . Hydromorphone Anaphylaxis  . Fentanyl Other (See Comments)    Personality change- cried a lot. Personality change- cried a lot.  . Versed [Midazolam] Other (See Comments)    Other reaction(s): Other (See Comments) Can in personality. Can in personality.  . Canagliflozin-Metformin Hcl Other (See Comments)  . Nsaids Other  (See Comments)    unknown  . Pneumococcal Polysaccharide Vaccine Swelling  . Pneumococcal Vac Polyvalent Swelling  . Pneumococcal Vaccine     Other reaction(s): SWELLING  . Sulfa Antibiotics Other (See Comments)    unknown hives  . Altace [Ramipril] Rash    Shortness of breath    No results found for this or any previous visit (from the past 2160 hour(s)).  Objective: General: Patient is awake, alert, and oriented x 3 and in no acute distress. Ambulates with bilateral braces.  Integument: Skin is warm, dry and supple bilateral. Nails are tender, long, thickened and  dystrophic with subungual debris, consistent with onychomycosis, 1-5 bilateral. No signs of infection. No open lesions or preulcerative lesions present bilateral. Minimal keratosis to medial aspect of left first metatarsophalangeal joint with no signs of infection. Remaining integument unremarkable.  Vasculature:  Dorsalis Pedis pulse 1/4 bilateral. Posterior Tibial pulse  1/4 bilateral.  Capillary fill time <3 sec 1-5 bilateral. Scant hair growth to the level of the digits. Temperature gradient within normal limits. No varicosities present bilateral. No edema present bilateral.   Neurology: The patient has absent sensation measured with a 5.07/10g Semmes Weinstein Monofilament at all pedal sites bilateral . Vibratory sensation absent bilateral with tuning fork. No Babinski sign present bilateral.   Musculoskeletal: Planus and digital contracture secondary to dropfoot noted bilateral after spinal surgery. Muscular strength 4/5 in all lower extremity muscular groups bilateral without pain on range of motion . No tenderness with calf compression bilateral.  Assessment and Plan: Problem List Items Addressed This Visit    None    Visit Diagnoses    Pain due to onychomycosis of toenail    -  Primary   Diabetic polyneuropathy associated with type 2 diabetes mellitus (HCC)       Foot drop, bilateral         -Examined  patient. -Discussed and educated patient on diabetic foot care, especially with  regards to the vascular, neurological and musculoskeletal systems.  -Stressed the importance of good glycemic control and the detriment of not  controlling glucose levels in relation to the foot. -Mechanically debrided all nails 1-5 bilateral  using sterile nail nipper and filed with dremel without incident  -Recommend continue with bilateral braces for foot drop, good supportive shoes, and rolling walker for assistance with stability in gait.  -Answered all patient questions -Patient to return in 3 months for at risk foot care -Patient advised to call the office if any problems or questions arise in the meantime.  Landis Martins, DPM

## 2016-01-25 ENCOUNTER — Observation Stay (HOSPITAL_COMMUNITY)
Admission: AD | Admit: 2016-01-25 | Discharge: 2016-01-28 | Disposition: A | Discharge status: Home Health | Payer: Medicare Other | Source: Other Acute Inpatient Hospital | Attending: Internal Medicine | Admitting: Internal Medicine

## 2016-01-25 DIAGNOSIS — R531 Weakness: Secondary | ICD-10-CM | POA: Diagnosis not present

## 2016-01-25 DIAGNOSIS — R29898 Other symptoms and signs involving the musculoskeletal system: Secondary | ICD-10-CM | POA: Diagnosis not present

## 2016-01-25 DIAGNOSIS — G459 Transient cerebral ischemic attack, unspecified: Secondary | ICD-10-CM

## 2016-01-25 DIAGNOSIS — K219 Gastro-esophageal reflux disease without esophagitis: Secondary | ICD-10-CM | POA: Diagnosis not present

## 2016-01-25 DIAGNOSIS — I482 Chronic atrial fibrillation: Secondary | ICD-10-CM

## 2016-01-25 DIAGNOSIS — Z794 Long term (current) use of insulin: Secondary | ICD-10-CM | POA: Diagnosis not present

## 2016-01-25 DIAGNOSIS — I6782 Cerebral ischemia: Secondary | ICD-10-CM | POA: Insufficient documentation

## 2016-01-25 DIAGNOSIS — Z79899 Other long term (current) drug therapy: Secondary | ICD-10-CM | POA: Diagnosis not present

## 2016-01-25 DIAGNOSIS — Z87891 Personal history of nicotine dependence: Secondary | ICD-10-CM | POA: Insufficient documentation

## 2016-01-25 DIAGNOSIS — I5030 Unspecified diastolic (congestive) heart failure: Secondary | ICD-10-CM | POA: Diagnosis present

## 2016-01-25 DIAGNOSIS — I4891 Unspecified atrial fibrillation: Secondary | ICD-10-CM | POA: Diagnosis present

## 2016-01-25 DIAGNOSIS — R200 Anesthesia of skin: Secondary | ICD-10-CM | POA: Diagnosis present

## 2016-01-25 DIAGNOSIS — I639 Cerebral infarction, unspecified: Secondary | ICD-10-CM | POA: Diagnosis present

## 2016-01-25 DIAGNOSIS — I110 Hypertensive heart disease with heart failure: Secondary | ICD-10-CM | POA: Diagnosis not present

## 2016-01-25 DIAGNOSIS — R011 Cardiac murmur, unspecified: Secondary | ICD-10-CM | POA: Insufficient documentation

## 2016-01-25 DIAGNOSIS — I5032 Chronic diastolic (congestive) heart failure: Secondary | ICD-10-CM | POA: Insufficient documentation

## 2016-01-25 DIAGNOSIS — E785 Hyperlipidemia, unspecified: Secondary | ICD-10-CM | POA: Diagnosis not present

## 2016-01-25 DIAGNOSIS — I10 Essential (primary) hypertension: Secondary | ICD-10-CM

## 2016-01-25 DIAGNOSIS — E119 Type 2 diabetes mellitus without complications: Secondary | ICD-10-CM

## 2016-01-25 DIAGNOSIS — M4806 Spinal stenosis, lumbar region: Secondary | ICD-10-CM | POA: Diagnosis not present

## 2016-01-25 DIAGNOSIS — Z7901 Long term (current) use of anticoagulants: Secondary | ICD-10-CM | POA: Diagnosis not present

## 2016-01-25 DIAGNOSIS — M4802 Spinal stenosis, cervical region: Secondary | ICD-10-CM | POA: Insufficient documentation

## 2016-01-25 DIAGNOSIS — Z6841 Body Mass Index (BMI) 40.0 and over, adult: Secondary | ICD-10-CM | POA: Diagnosis not present

## 2016-01-25 DIAGNOSIS — E1140 Type 2 diabetes mellitus with diabetic neuropathy, unspecified: Secondary | ICD-10-CM | POA: Insufficient documentation

## 2016-01-25 DIAGNOSIS — Z8673 Personal history of transient ischemic attack (TIA), and cerebral infarction without residual deficits: Secondary | ICD-10-CM | POA: Insufficient documentation

## 2016-01-25 DIAGNOSIS — M4804 Spinal stenosis, thoracic region: Secondary | ICD-10-CM | POA: Diagnosis not present

## 2016-01-25 DIAGNOSIS — M48061 Spinal stenosis, lumbar region without neurogenic claudication: Secondary | ICD-10-CM | POA: Diagnosis present

## 2016-01-25 LAB — GLUCOSE, CAPILLARY: Glucose-Capillary: 167 mg/dL — ABNORMAL HIGH (ref 65–99)

## 2016-01-25 MED ORDER — ACETAMINOPHEN 650 MG RE SUPP: 650.0000 mg | RECTAL | Status: DC | PRN

## 2016-01-25 MED ORDER — RIVAROXABAN 20 MG PO TABS
20.0000 mg | ORAL_TABLET | Freq: Every day | ORAL | Status: DC
  Administered 2016-01-26 – 2016-01-28 (×3): 20 mg via ORAL
  Filled 2016-01-25 (×3): qty 1

## 2016-01-25 MED ORDER — PREGABALIN 100 MG PO CAPS
200.0000 mg | ORAL_CAPSULE | Freq: Every day | ORAL | Status: DC
  Administered 2016-01-26 – 2016-01-27 (×3): 200 mg via ORAL
  Filled 2016-01-25 (×3): qty 2

## 2016-01-25 MED ORDER — HYDROCODONE-ACETAMINOPHEN 5-325 MG PO TABS: 1.0000 | ORAL_TABLET | ORAL | Status: DC | PRN

## 2016-01-25 MED ORDER — INSULIN GLARGINE 100 UNIT/ML ~~LOC~~ SOLN
25.0000 [IU] | Freq: Every day | SUBCUTANEOUS | Status: DC
  Administered 2016-01-26 – 2016-01-27 (×3): 25 [IU] via SUBCUTANEOUS
  Filled 2016-01-25 (×4): qty 0.25

## 2016-01-25 MED ORDER — FLUTICASONE PROPIONATE 50 MCG/ACT NA SUSP
2.0000 | Freq: Every day | NASAL | Status: DC
  Administered 2016-01-26 – 2016-01-28 (×3): 2 via NASAL
  Filled 2016-01-25: qty 16

## 2016-01-25 MED ORDER — LABETALOL HCL 100 MG PO TABS: 100.0000 mg | ORAL_TABLET | Freq: Two times a day (BID) | ORAL | Status: DC

## 2016-01-25 MED ORDER — PREGABALIN 100 MG PO CAPS
100.0000 mg | ORAL_CAPSULE | Freq: Two times a day (BID) | ORAL | Status: DC
  Administered 2016-01-26 – 2016-01-28 (×6): 100 mg via ORAL
  Filled 2016-01-25 (×6): qty 1

## 2016-01-25 MED ORDER — DIGOXIN 125 MCG PO TABS
0.1250 mg | ORAL_TABLET | Freq: Every day | ORAL | Status: DC
  Administered 2016-01-26 – 2016-01-28 (×3): 0.125 mg via ORAL
  Filled 2016-01-25 (×3): qty 1

## 2016-01-25 MED ORDER — POTASSIUM CHLORIDE CRYS ER 20 MEQ PO TBCR
20.0000 meq | EXTENDED_RELEASE_TABLET | Freq: Three times a day (TID) | ORAL | Status: DC
  Administered 2016-01-26 – 2016-01-28 (×9): 20 meq via ORAL
  Filled 2016-01-25 (×9): qty 1

## 2016-01-25 MED ORDER — SIMVASTATIN 20 MG PO TABS
20.0000 mg | ORAL_TABLET | Freq: Every day | ORAL | Status: DC
  Administered 2016-01-26 – 2016-01-27 (×3): 20 mg via ORAL
  Filled 2016-01-25 (×3): qty 1

## 2016-01-25 MED ORDER — LOSARTAN POTASSIUM 50 MG PO TABS
100.0000 mg | ORAL_TABLET | Freq: Every day | ORAL | Status: DC
  Administered 2016-01-26 – 2016-01-28 (×3): 100 mg via ORAL
  Filled 2016-01-25 (×3): qty 2

## 2016-01-25 MED ORDER — HYDRALAZINE HCL 20 MG/ML IJ SOLN
20.0000 mg | Freq: Four times a day (QID) | INTRAMUSCULAR | Status: DC | PRN
  Administered 2016-01-25: 20 mg via INTRAVENOUS
  Filled 2016-01-25: qty 1

## 2016-01-25 MED ORDER — FUROSEMIDE 40 MG PO TABS
40.0000 mg | ORAL_TABLET | Freq: Every day | ORAL | Status: DC
  Administered 2016-01-26 – 2016-01-28 (×3): 40 mg via ORAL
  Filled 2016-01-25 (×3): qty 1

## 2016-01-25 MED ORDER — MESALAMINE 1.2 G PO TBEC
1.2000 g | DELAYED_RELEASE_TABLET | Freq: Every day | ORAL | Status: DC
  Administered 2016-01-26 – 2016-01-28 (×3): 1.2 g via ORAL
  Filled 2016-01-25 (×4): qty 1

## 2016-01-25 MED ORDER — IPRATROPIUM-ALBUTEROL 0.5-2.5 (3) MG/3ML IN SOLN
3.0000 mL | RESPIRATORY_TRACT | Status: DC | PRN
Start: 2016-01-25 — End: 2016-01-28

## 2016-01-25 MED ORDER — PANTOPRAZOLE SODIUM 40 MG PO TBEC
40.0000 mg | DELAYED_RELEASE_TABLET | Freq: Every day | ORAL | Status: DC
Start: 2016-01-26 — End: 2016-01-28
  Administered 2016-01-26 – 2016-01-28 (×3): 40 mg via ORAL
  Filled 2016-01-25 (×3): qty 1

## 2016-01-25 MED ORDER — CLONIDINE HCL 0.1 MG PO TABS: 0.1000 mg | ORAL_TABLET | Freq: Every day | ORAL | Status: DC

## 2016-01-25 MED ORDER — STROKE: EARLY STAGES OF RECOVERY BOOK
Freq: Once | Status: AC
  Administered 2016-01-26: 07:00:00

## 2016-01-25 MED ORDER — INSULIN ASPART 100 UNIT/ML ~~LOC~~ SOLN
0.0000 [IU] | Freq: Three times a day (TID) | SUBCUTANEOUS | Status: DC
  Administered 2016-01-26 – 2016-01-27 (×4): 3 [IU] via SUBCUTANEOUS
  Administered 2016-01-27: 5 [IU] via SUBCUTANEOUS
  Administered 2016-01-27: 3 [IU] via SUBCUTANEOUS
  Administered 2016-01-28 (×2): 5 [IU] via SUBCUTANEOUS
  Administered 2016-01-28: 3 [IU] via SUBCUTANEOUS

## 2016-01-25 MED ORDER — MONTELUKAST SODIUM 10 MG PO TABS
10.0000 mg | ORAL_TABLET | Freq: Every day | ORAL | Status: DC
  Administered 2016-01-26 – 2016-01-27 (×3): 10 mg via ORAL
  Filled 2016-01-25 (×3): qty 1

## 2016-01-25 MED ORDER — LORATADINE 10 MG PO TABS
10.0000 mg | ORAL_TABLET | Freq: Every day | ORAL | Status: DC
  Administered 2016-01-26 – 2016-01-28 (×3): 10 mg via ORAL
  Filled 2016-01-25 (×3): qty 1

## 2016-01-25 MED ORDER — PREGABALIN 100 MG PO CAPS: 100.0000 mg | ORAL_CAPSULE | ORAL | Status: DC

## 2016-01-25 MED ORDER — ACETAMINOPHEN 325 MG PO TABS
650.0000 mg | ORAL_TABLET | ORAL | Status: DC | PRN
Start: 2016-01-25 — End: 2016-01-28
  Administered 2016-01-26: 650 mg via ORAL
  Filled 2016-01-25: qty 2

## 2016-01-25 MED ORDER — SPIRONOLACTONE 25 MG PO TABS
12.5000 mg | ORAL_TABLET | Freq: Every day | ORAL | Status: DC
Start: 2016-01-26 — End: 2016-01-28
  Administered 2016-01-26 – 2016-01-28 (×3): 12.5 mg via ORAL
  Filled 2016-01-25 (×3): qty 1

## 2016-01-25 NOTE — Consult Note (Signed)
Admission H&P    Chief Complaint: Acute onset of left leg weakness.  HPI: Phillip Chandler is an 82 y.o. male with a history of atrial fibrillation on Xarelto, hypertension, hyperlipidemia CHF and diabetes mellitus, transferred from [facility] for management of possible acute stroke. Patient noticed acute weakness involving his left lower extremity at 9 AM this morning. Since then he's been unable to stand or walk without assistance. Current presenting symptoms are in addition to his known left foot drop from lumbar radiculopathy. CT scan of his head showed no acute intracranial abnormality. MRI was unavailable at [facility]. He had no symptoms involving his left upper extremity. There were no speech changes nor signs of facial droop. He has a history of previous stroke and 2005 which affected his right extremities. Recovery reportedly was excellent. MRI following admission to [facility] did not show evidence of an acute right cerebral infarction.  LSN: 9:00 AM on 01/25/2016 tPA Given: No: On anticoagulation with Jennye Moccasin mRankin:  Past Medical History:  Diagnosis Date  . Anxiety   . Arthritis   . Cancer ([place])    basal and squamous cell face  . CHF (congestive heart failure) ([place])   . Complication of anesthesia   . Diabetes mellitus without complication ([place])   . Diverticula of colon   . Dysrhythmia    afib hx of  . GERD (gastroesophageal reflux disease)   . Hypertension   . Pneumonia    10/2011  . PONV (postoperative nausea and vomiting)   . Stroke [facility] 2005    Past Surgical History:  Procedure Laterality Date  . BACK SURGERY  1991  . EYE SURGERY     Cartaract bil  . HERNIA REPAIR     Umbicilal-2011  . JOINT REPLACEMENT     Bil Knee  . SHOULDER FUSION      No family history on file. Social History:  reports that he has quit smoking. He quit after 8.00 years of use. He does not have any smokeless tobacco history on file. He reports that he does not drink alcohol or use  drugs.  Allergies:  Allergies  Allergen Reactions  . Altace [Ramipril] Shortness Of Breath and Rash  . Bee Venom Swelling    Mouth swelling - treated with benadryl  . Hydromorphone Anaphylaxis  . Fentanyl Other (See Comments)    Personality change- cried a lot (reaction to combination of versed and fentanyl)  . Versed [Midazolam] Other (See Comments)    Change in personality (reaction to combination of fentanyl and versed)  . Canagliflozin-Metformin Hcl Other (See Comments)    dehydration  . Exenatide Other (See Comments)    Leg problems  . Nsaids Other (See Comments)    Pt cannot take with xarelto  . Pneumococcal Vaccine Swelling    Swelling at injection site  . Sulfa Antibiotics Rash    Medications Prior to Admission  Medication Sig Dispense Refill  . albuterol (PROAIR HFA) 108 (90 BASE) MCG/ACT inhaler Inhale 2 puffs into the lungs every 4 (four) hours as needed for wheezing or shortness of breath.     Marland Kitchen ammonium lactate (AMLACTIN) 12 % cream Apply topically 3 (three) times daily as needed (spots on arm). Take as directed.    . cloNIDine (CATAPRES) 0.1 MG tablet Take 0.1 mg by mouth daily.     . digoxin (LANOXIN) 0.125 MG tablet Take 0.125 mg by mouth daily.     . fluticasone (FLONASE) 50 MCG/ACT nasal spray Place 2 sprays into  both nostrils daily.     . furosemide (LASIX) 40 MG tablet Take 40 mg by mouth daily.    Marland Kitchen HYDROcodone-acetaminophen (NORCO/VICODIN) 5-325 MG per tablet Take 1 tablet by mouth every 4 (four) hours as needed (pain).     . Insulin Glargine (LANTUS SOLOSTAR) 100 UNIT/ML Solostar Pen Inject 25 Units into the skin daily at 10 pm.    . labetalol (NORMODYNE) 200 MG tablet Take 100 mg by mouth 2 (two) times daily.     Marland Kitchen loratadine (CLARITIN) 10 MG tablet Take 10 mg by mouth daily.    Marland Kitchen losartan (COZAAR) 100 MG tablet Take 100 mg by mouth daily.     . mesalamine (LIALDA) 1.2 G EC tablet Take 1.2 g by mouth daily with breakfast.     . montelukast (SINGULAIR) 10  MG tablet Take 10 mg by mouth at bedtime.     Marland Kitchen omega-3 acid ethyl esters (LOVAZA) 1 g capsule Take 1 g by mouth 2 (two) times daily.    Marland Kitchen omeprazole (PRILOSEC) 40 MG capsule Take 40 mg by mouth daily.     . potassium chloride SA (KLOR-CON M20) 20 MEQ tablet Take 20 mEq by mouth 3 (three) times daily.     . pregabalin (LYRICA) 100 MG capsule Take 100 mg by mouth See admin instructions. Take 1 capsule (100 mg) by mouth every morning, noon and 5pm (take 200 mg capsule at bedtime)    . pregabalin (LYRICA) 200 MG capsule Take 200 mg by mouth at bedtime.    . rivaroxaban (XARELTO) 20 MG TABS tablet Take 20 mg by mouth daily with supper.     . simvastatin (ZOCOR) 40 MG tablet Take 20 mg by mouth at bedtime.     . sitaGLIPtin-metformin (JANUMET) 50-500 MG per tablet Take 1 tablet by mouth 2 (two) times daily with a meal.     . spironolactone (ALDACTONE) 25 MG tablet Take 12.5 mg by mouth daily.     . [DISCONTINUED] amiodarone (PACERONE) 200 MG tablet Take 1 tablet (200 mg total) by mouth 2 (two) times daily. (Patient not taking: Reported on 01/25/2016) 60 tablet 1  . [DISCONTINUED] labetalol (NORMODYNE) 300 MG tablet Take 1 tablet (300 mg total) by mouth 2 (two) times daily. (Patient not taking: Reported on 01/25/2016) 60 tablet 1    ROS: History obtained from the patient  General ROS: negative for - chills, fatigue, fever, night sweats, weight gain or weight loss Psychological ROS: negative for - behavioral disorder, hallucinations, memory difficulties, mood swings or suicidal ideation Ophthalmic ROS: negative for - blurry vision, double vision, eye pain or loss of vision ENT ROS: negative for - epistaxis, nasal discharge, oral lesions, sore throat, tinnitus or vertigo Allergy and Immunology ROS: negative for - hives or itchy/watery eyes Hematological and Lymphatic ROS: negative for - bleeding problems, bruising or swollen lymph nodes Endocrine ROS: negative for - galactorrhea, hair pattern changes,  polydipsia/polyuria or temperature intolerance Respiratory ROS: negative for - cough, hemoptysis, shortness of breath or wheezing Cardiovascular ROS: negative for - chest pain, dyspnea on exertion, edema or irregular heartbeat Gastrointestinal ROS: negative for - abdominal pain, diarrhea, hematemesis, nausea/vomiting or stool incontinence Genito-Urinary ROS: negative for - dysuria, hematuria, incontinence or urinary frequency/urgency Musculoskeletal ROS: negative for - joint swelling or muscular weakness Neurological ROS: as noted in HPI Dermatological ROS: negative for rash and skin lesion changes  Physical Examination: Blood pressure (!) 215/103, pulse 64, temperature 98.8 F (37.1 C), temperature source Oral, resp. rate 16,  height 5\' 7"  (1.702 m), weight 122.2 kg (269 lb 6.4 oz), SpO2 96 %.  HEENT-  Normocephalic, no lesions, without obvious abnormality.  Normal external eye and conjunctiva.  Normal TM's bilaterally.  Normal auditory canals and external ears. Normal external nose, mucus membranes and septum.  Normal pharynx. Neck supple with no masses, nodes, nodules or enlargement. Cardiovascular - irregularly irregular rhythm, S1, S2 normal and no S3 or S4 Lungs - chest clear, no wheezing, rales, normal symmetric air entry Abdomen - soft, non-tender; bowel sounds normal; no masses,  no organomegaly Extremities - no joint deformity, moderate edema of distal lower extremities  Neurologic Examination: Mental Status: Alert, oriented, no acute distress.  Speech fluent without evidence of aphasia. Able to follow commands without difficulty. Cranial Nerves: II-Visual fields were normal. III/IV/VI-Pupils were equal and reacted normally to light. Extraocular movements were full and conjugate.    V/VII-no facial numbness and no facial weakness. VIII-normal. X-normal speech and symmetrical palatal movement. XI: trapezius strength/neck flexion strength normal bilaterally XII-midline tongue  extension with normal strength. Motor: No drift of extremities, including no drift of left lower extremity; left foot drop with 0/5 strength of tibialis anterior muscle; normal strength of left gastrocnemius, as well as quadriceps and hip flexors. Sensory: Numbness of left lateral calf and absent vibration sensation in lower extremities. Deep Tendon Reflexes: 1+ and symmetric in upper extremities and absent in lower extremities. Plantars: Mute bilaterally Cerebellar: Normal finger-to-nose testing. Carotid auscultation: Normal  No results found for this or any previous visit (from the past 48 hour(s)). No results found.  Assessment: 82 y.o. male with multiple risk factors for stroke as well as previous cerebral infarction presenting with weakness of his left lower extremity which appears to have resolved at this point. MRI showed no signs of an acute right cerebral infarction. TIA cannot be ruled out.  Stroke Risk Factors - atrial fibrillation, diabetes mellitus, hyperlipidemia and hypertension  Plan: 1. HgbA1c, fasting lipid panel 2. MRI, MRA  of the brain without contrast 3. PT consult 4. Echocardiogram 5. Carotid dopplers 6. Prophylactic therapy-Anticoagulation: Xarelto 7. Risk factor modification 8. Telemetry monitoring  C.R. Nicole Kindred, MD [facility] [phone]  01/25/2016, 10:17 PM

## 2016-01-25 NOTE — H&P (Signed)
History and Physical    Phillip Chandler [id number] DOB: May 15, 1931 DOA: 01/25/2016  PCP: Houston Siren, MD   Patient coming from: [facility]  Chief Complaint: Left lower extremity numbness and sensory deficits  HPI: Phillip Chandler is a 82 y.o. 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  He reported to the ED at [place] was negative for acute CVA.  He was evaluated by teleneurology.  Transfer was requested to [facility] for MRI and further evaluation.  Accelerated HTN in the ED with systolic BP greater than [id number], transiently improved to 180's after a total of 60mg  of IV labetalol, but heart rates have also intermittently dipped into the 30's.  He has dizzines but he denies headache or vision disturbance.  No falls.  No LOC.  He ambulates with a walker at baseline, and is typically primary caregiver for his wife.  No nausea or vomiting.  No chest pain or shortness of breath.  Review of Systems: As per HPI otherwise 10 point review of systems negative.    Past Medical History:  Diagnosis Date  . Anxiety   . Arthritis   . Cancer ([place])    basal and squamous cell face  . CHF (congestive heart failure) ([place])   . Complication of anesthesia   . Diabetes mellitus without complication ([place])   . Diverticula of colon   . Dysrhythmia    afib hx of  . GERD (gastroesophageal reflux disease)   . Hypertension   . Pneumonia    10/2011  . PONV (postoperative nausea and vomiting)   . Stroke ([place]) [id number]  diastolic CHF  Past Surgical History:  Procedure Laterality Date  . BACK SURGERY  1991  . EYE  SURGERY     Cartaract bil  . HERNIA REPAIR     Umbicilal-2011  . JOINT REPLACEMENT     Bil Knee  . SHOULDER FUSION       reports that he has quit smoking. He quit after 8.00 years of use. He does not have any smokeless tobacco history on file. He reports that he does not drink alcohol or use drugs.  He is married.  He has three adult children.  His daughter Basilia Jumbo is his next of kin/healthcare POA.  FAMILY HISTORY: Mother died at age 85; she had a history of HTN. Maternal grandmother died at age 24. Two sisters had lung cancer, both smoked.  One deceased.  One is a survivor S/P lung surgery. No known family history of CVA.  Allergies  Allergen Reactions  . Altace [Ramipril] Shortness Of Breath and Rash  . Bee Venom Swelling    Mouth swelling - treated with benadryl  . Hydromorphone Anaphylaxis  . Fentanyl Other (See Comments)    Personality change- cried a lot (reaction to combination of versed and fentanyl)  . Versed [Midazolam] Other (See Comments)    Change in personality (reaction to combination of fentanyl and versed)  . Canagliflozin-Metformin Hcl Other (See Comments)    dehydration  . Exenatide Other (See Comments)    Leg problems  . Nsaids Other (See Comments)    Pt cannot  take with xarelto  . Pneumococcal Vaccine Swelling    Swelling at injection site  . Sulfa Antibiotics Rash    Prior to Admission medications   Medication Sig Start Date End Date Taking? Authorizing Provider  albuterol (PROAIR HFA) 108 (90 BASE) MCG/ACT inhaler Inhale 2 puffs into the lungs every 4 (four) hours as needed for wheezing or shortness of breath.    Yes Historical Provider, MD  ammonium lactate (AMLACTIN) 12 % cream Apply topically 3 (three) times daily as needed (spots on arm). Take as directed.   Yes Historical Provider, MD  cloNIDine (CATAPRES) 0.1 MG tablet Take 0.1 mg by mouth daily.    Yes Historical Provider, MD  digoxin (LANOXIN) 0.125 MG tablet Take 0.125 mg by mouth daily.     Yes Historical Provider, MD  fluticasone (FLONASE) 50 MCG/ACT nasal spray Place 2 sprays into both nostrils daily.    Yes Historical Provider, MD  furosemide (LASIX) 40 MG tablet Take 40 mg by mouth daily.   Yes Historical Provider, MD  HYDROcodone-acetaminophen (NORCO/VICODIN) 5-325 MG per tablet Take 1 tablet by mouth every 4 (four) hours as needed (pain).    Yes Historical Provider, MD  Insulin Glargine (LANTUS SOLOSTAR) 100 UNIT/ML Solostar Pen Inject 25 Units into the skin daily at 10 pm.   Yes Historical Provider, MD  labetalol (NORMODYNE) 200 MG tablet Take 100 mg by mouth 2 (two) times daily.    Yes Historical Provider, MD  loratadine (CLARITIN) 10 MG tablet Take 10 mg by mouth daily.   Yes Historical Provider, MD  losartan (COZAAR) 100 MG tablet Take 100 mg by mouth daily.    Yes Historical Provider, MD  mesalamine (LIALDA) 1.2 G EC tablet Take 1.2 g by mouth daily with breakfast.    Yes Historical Provider, MD  montelukast (SINGULAIR) 10 MG tablet Take 10 mg by mouth at bedtime.    Yes Historical Provider, MD  omega-3 acid ethyl esters (LOVAZA) 1 g capsule Take 1 g by mouth 2 (two) times daily.   Yes Historical Provider, MD  omeprazole (PRILOSEC) 40 MG capsule Take 40 mg by mouth daily.    Yes Historical Provider, MD  potassium chloride SA (KLOR-CON M20) 20 MEQ tablet Take 20 mEq by mouth 3 (three) times daily.    Yes Historical Provider, MD  pregabalin (LYRICA) 100 MG capsule Take 100 mg by mouth See admin instructions. Take 1 capsule (100 mg) by mouth every morning, noon and 5pm (take 200 mg capsule at bedtime)   Yes Historical Provider, MD  pregabalin (LYRICA) 200 MG capsule Take 200 mg by mouth at bedtime.   Yes Historical Provider, MD  rivaroxaban (XARELTO) 20 MG TABS tablet Take 20 mg by mouth daily with supper.  12/19/14  Yes Historical Provider, MD  simvastatin (ZOCOR) 40 MG tablet Take 20 mg by mouth at bedtime.    Yes Historical Provider, MD  sitaGLIPtin-metformin (JANUMET) 50-500  MG per tablet Take 1 tablet by mouth 2 (two) times daily with a meal.    Yes Historical Provider, MD  spironolactone (ALDACTONE) 25 MG tablet Take 12.5 mg by mouth daily.    Yes Historical Provider, MD    Physical Exam: Vitals:   01/25/16 1956 01/25/16 2000  BP: (!) 210/100 (!) 215/103  Pulse: 75 64  Resp:  16  Temp: 98.4 F (36.9 C) 98.8 F (37.1 C)  TempSrc: Oral Oral  SpO2: 97% 96%  Weight:  122.2 kg (269 lb 6.4 oz)  Height:  5\' 7"  (  1.702 m)      Constitutional: NAD, calm, comfortable Vitals:   01/25/16 1956 01/25/16 2000  BP: (!) 210/100 (!) 215/103  Pulse: 75 64  Resp:  16  Temp: 98.4 F (36.9 C) 98.8 F (37.1 C)  TempSrc: Oral Oral  SpO2: 97% 96%  Weight:  122.2 kg (269 lb 6.4 oz)  Height:  5\' 7"  (1.702 m)   Eyes: PERRL, lids and conjunctivae normal ENMT: Mucous membranes are moist. Posterior pharynx clear of any exudate or lesions. Normal dentition.  Neck: normal appearance, supple, no masses Respiratory: clear to auscultation bilaterally, no wheezing, no crackles. Normal respiratory effort. No accessory muscle use.  Cardiovascular: Normal rate, regular rhythm, + systolic 2/6 systolic murmur at RUSB.  No rubs / gallops. 1-2+ bilateral lower extremity edema.  2+ pedal pulses. No carotid bruits.  GI: abdomen is soft and compressible.  No distention.  No tenderness.  No masses palpated.  Bowel sounds are present. Musculoskeletal:  No joint deformity in upper and lower extremities. Good ROM, no contractures. Normal muscle tone.  Skin: no rashes, warm and dry Neurologic: CN 2-12 grossly intact. He has LLE weakness with sensory deficits. Psychiatric: Normal judgment and insight. Alert and oriented x 3. Normal mood.     Labs on Admission: I have personally reviewed following labs and imaging studies from [facility] WBC 9, 14 % monocytes Hgb 12.7 Hct 38 Platelet 200 Normal CMP INR 1.1 Negative troponin  Radiological Exams on Admission: Head CT negative  for acute process.  EKG: Independently reviewed. Rate controlled atrial fibrillation.  No acute ST segment changes.  Assessment/Plan Principal Problem:   Weakness of left leg Active Problems:   Diabetes mellitus (HCC)   Stroke (HCC)   Atrial fibrillation (HCC)   Diastolic heart failure (HCC)   Accelerated hypertension   Undiagnosed cardiac murmurs   CVA (cerebral infarction)      Signs and symptoms concerning for recurrent acute CVA --Admit to telemetry --Dr. Nicole Kindred will see with neurology --MRI/MRA ASAP as long as ortho hardware is not a contraindication --Stroke orderset used --Aspirin has not been given yet; anticoagulated with Xarelto with NSAID intolerance --Will check A1c, lipid panel --Swallow screen --PT/OT/Speech evaluations  Heart murmur --Complete echo per stroke protocol  Accelerated HTN, discussed with RN.  Target blood pressures tonight [id number] systolic, --Hold labetalol for now due to bradycardia --Clonidine qHS --Losartan in AM --Continue home diuretics  Chronic a fib --Continue Xarelto for now --Digoxin in AM  Diastolic CHF --Chronic LE edema otherwise asymptomatic --Continue home doses of lasix, spironolactone for now --AM BMP   DVT prophylaxis: FULL antiocoagulation with Xarelto Code Status: FULL Family Communication: Daughter Rosa at bedside at time of admission Disposition Plan: To be determined Consults called: neurology Admission status: Inpatient, telemetry   TIME SPENT: 70 minutes   Eber Jones MD [facility] Pager [phone]  If 7PM-7AM, please contact night-coverage [email or web address] Password [facility]  01/25/2016, 9:45 PM

## 2016-01-26 ENCOUNTER — Encounter (HOSPITAL_COMMUNITY): Payer: Medicare Other

## 2016-01-26 ENCOUNTER — Inpatient Hospital Stay (HOSPITAL_COMMUNITY): Payer: Medicare Other

## 2016-01-26 ENCOUNTER — Encounter (HOSPITAL_COMMUNITY): Payer: Self-pay

## 2016-01-26 DIAGNOSIS — Z7901 Long term (current) use of anticoagulants: Secondary | ICD-10-CM

## 2016-01-26 DIAGNOSIS — R531 Weakness: Secondary | ICD-10-CM | POA: Diagnosis not present

## 2016-01-26 DIAGNOSIS — R29898 Other symptoms and signs involving the musculoskeletal system: Secondary | ICD-10-CM

## 2016-01-26 DIAGNOSIS — I4891 Unspecified atrial fibrillation: Secondary | ICD-10-CM | POA: Diagnosis not present

## 2016-01-26 DIAGNOSIS — I5032 Chronic diastolic (congestive) heart failure: Secondary | ICD-10-CM | POA: Diagnosis not present

## 2016-01-26 DIAGNOSIS — I10 Essential (primary) hypertension: Secondary | ICD-10-CM | POA: Diagnosis not present

## 2016-01-26 DIAGNOSIS — R011 Cardiac murmur, unspecified: Secondary | ICD-10-CM | POA: Diagnosis not present

## 2016-01-26 LAB — LIPID PANEL
Cholesterol: 120 mg/dL (ref 0–200)
HDL: 26 mg/dL — ABNORMAL LOW (ref >40)
LDL Cholesterol: 64 mg/dL (ref 0–99)
Total CHOL/HDL Ratio: 4.6 RATIO
Triglycerides: 148 mg/dL (ref <150)
VLDL: 30 mg/dL (ref 0–40)

## 2016-01-26 LAB — CBC
HCT: 40 % (ref 39.0–52.0)
Hemoglobin: 12.6 g/dL — ABNORMAL LOW (ref 13.0–17.0)
MCH: 28.1 pg (ref 26.0–34.0)
MCHC: 31.5 g/dL (ref 30.0–36.0)
MCV: 89.3 fL (ref 78.0–100.0)
Platelets: 226 10*3/uL (ref 150–400)
RBC: 4.48 MIL/uL (ref 4.22–5.81)
RDW: 15.5 % (ref 11.5–15.5)
WBC: 10.3 10*3/uL (ref 4.0–10.5)

## 2016-01-26 LAB — BASIC METABOLIC PANEL
Anion gap: 10 (ref 5–15)
BUN: 10 mg/dL (ref 6–20)
CO2: 26 mmol/L (ref 22–32)
Calcium: 9.3 mg/dL (ref 8.9–10.3)
Chloride: 102 mmol/L (ref 101–111)
Creatinine, Ser: 0.75 mg/dL (ref 0.61–1.24)
GFR calc Af Amer: >60 mL/min (ref >60)
GFR calc non Af Amer: >60 mL/min (ref >60)
Glucose, Bld: 173 mg/dL — ABNORMAL HIGH (ref 65–99)
Potassium: 3.6 mmol/L (ref 3.5–5.1)
Sodium: 138 mmol/L (ref 135–145)

## 2016-01-26 LAB — GLUCOSE, CAPILLARY
Glucose-Capillary: 163 mg/dL — ABNORMAL HIGH (ref 65–99)
Glucose-Capillary: 174 mg/dL — ABNORMAL HIGH (ref 65–99)
Glucose-Capillary: 183 mg/dL — ABNORMAL HIGH (ref 65–99)
Glucose-Capillary: 194 mg/dL — ABNORMAL HIGH (ref 65–99)

## 2016-01-26 MED ORDER — LABETALOL HCL 200 MG PO TABS
300.0000 mg | ORAL_TABLET | Freq: Two times a day (BID) | ORAL | Status: DC
  Administered 2016-01-26 – 2016-01-28 (×5): 300 mg via ORAL
  Filled 2016-01-26 (×5): qty 1

## 2016-01-26 MED ORDER — DIPHENHYDRAMINE HCL 50 MG/ML IJ SOLN
25.0000 mg | Freq: Once | INTRAMUSCULAR | Status: DC
  Filled 2016-01-26 (×2): qty 1

## 2016-01-26 MED ORDER — CLONIDINE HCL 0.1 MG PO TABS
0.1000 mg | ORAL_TABLET | Freq: Two times a day (BID) | ORAL | Status: DC
  Administered 2016-01-26 – 2016-01-28 (×5): 0.1 mg via ORAL
  Filled 2016-01-26 (×5): qty 1

## 2016-01-26 NOTE — Evaluation (Signed)
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 MRI on 8/27 negative for acute infarct. 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    Clinical Impression   Pt reports he was independent with ADL PTA; wife occasionally would assist with LB dressing. Currently pt is overall min assist +2 for safety with functional mobility, supervision for UB ADL in sitting, and mod-max assist for LB ADL. Pt presenting with L upper and lower extremity weakness and decreased ROM impacting his independence and safety with ADL and functional mobility. OT eval limited by transport present for pt to MRI. Pt planning to d/c home with 24/7 supervision from his wife. At this time, recommending HHOT for follow up to maximize independence and safety with ADL and functional mobility upon return home. Pt may progress to no OT follow up. Pt would benefit from continued skilled OT to address established goals.    Follow Up Recommendations  Home health OT;Supervision/Assistance - 24 hour    Equipment Recommendations  None recommended by OT    Recommendations for Other Services       Precautions / Restrictions Precautions Precautions: Fall Required Braces or Orthoses: Other Brace/Splint Other Brace/Splint: bil AFOs Restrictions Weight Bearing Restrictions: No      Mobility Bed Mobility Overal bed mobility: Needs Assistance Bed Mobility: Supine to Sit;Sit to Supine     Supine to sit: Min assist Sit to supine: Min assist   General bed mobility  comments: Assist to scoot hips to EOB. Assist for LEs back to bed.  Transfers Overall transfer level: Needs assistance Equipment used: Rolling walker (2 wheeled) Transfers: Sit to/from Stand Sit to Stand: Min guard;+2 safety/equipment         General transfer comment: Min guard for sit to stand from EOB. Min assist +2 for mobility.    Balance Overall balance assessment: Needs assistance Sitting-balance support: Feet supported;No upper extremity supported Sitting balance-Leahy Scale: Good     Standing balance support: Bilateral upper extremity supported Standing balance-Leahy Scale: Poor Standing balance comment: RW for support                            ADL Overall ADL's : Needs assistance/impaired Eating/Feeding: Set up;Sitting   Grooming: Set up;Supervision/safety;Sitting   Upper Body Bathing: Set up;Supervision/ safety;Sitting   Lower Body Bathing: Moderate assistance;Sit to/from stand   Upper Body Dressing : Set up;Supervision/safety;Sitting   Lower Body Dressing: Maximal assistance;Sit to/from stand Lower Body Dressing Details (indicate cue type and reason): to don/doff bil AFOs Toilet Transfer: Minimal assistance;+2 for safety/equipment;Ambulation;BSC;RW Toilet Transfer Details (indicate cue type and reason): Simulated by sit to stand from EOB and functional mobiltiy in room. Toileting- Clothing Manipulation and Hygiene: Minimal assistance;Sit to/from stand       Functional mobility during ADLs: Minimal assistance;+2 for safety/equipment;Rolling walker General ADL Comments: Pt dragging LLE during functional mobility. OT eval limited by transport present to take pt to MRI.     Vision Additional Comments: Appears WFL.   Perception     Praxis  Pertinent Vitals/Pain Pain Assessment: No/denies pain     Hand Dominance Right   Extremity/Trunk Assessment Upper Extremity Assessment Upper Extremity Assessment: LUE deficits/detail LUE Deficits  / Details: Decreased shoulder strength and AROM. Overall functional during activities. LUE Coordination: decreased fine motor       Cervical / Trunk Assessment Cervical / Trunk Assessment: Kyphotic   Communication Communication Communication: No difficulties   Cognition Arousal/Alertness: Awake/alert Behavior During Therapy: WFL for tasks assessed/performed Overall Cognitive Status: Within Functional Limits for tasks assessed                     General Comments       Exercises       Shoulder Instructions      Home Living Family/patient expects to be discharged to:: Private residence Living Arrangements: Spouse/significant other Available Help at Discharge: Family;Available 24 hours/day Type of Home: House Home Access: Level entry     Home Layout: One level     Bathroom Shower/Tub: Occupational psychologist: Standard     Home Equipment: Environmental consultant - 4 wheels;Shower seat;Toilet riser   Additional Comments: Wife uses RW for mobility also.      Prior Functioning/Environment Level of Independence: Independent with assistive device(s)        Comments: RW for mobility    OT Diagnosis: Generalized weakness   OT Problem List: Decreased strength;Decreased range of motion;Impaired balance (sitting and/or standing);Decreased knowledge of use of DME or AE;Decreased knowledge of precautions;Obesity   OT Treatment/Interventions: Self-care/ADL training;Therapeutic exercise;Neuromuscular education;Energy conservation;DME and/or AE instruction;Therapeutic activities;Patient/family education;Balance training    OT Goals(Current goals can be found in the care plan section) Acute Rehab OT Goals Patient Stated Goal: return to being independent OT Goal Formulation: With patient Time For Goal Achievement: 02/09/16 Potential to Achieve Goals: Good ADL Goals Pt Will Perform Grooming: with supervision;standing Pt Will Perform Upper Body Bathing: with  supervision;sitting Pt Will Perform Lower Body Bathing: with supervision;sit to/from stand Pt Will Transfer to Toilet: with supervision;ambulating;bedside commode Pt Will Perform Toileting - Clothing Manipulation and hygiene: with supervision;sit to/from stand Pt Will Perform Tub/Shower Transfer: Shower transfer;shower seat;ambulating;rolling walker;with supervision Pt/caregiver will Perform Home Exercise Program: Increased ROM;Left upper extremity;Independently;With written HEP provided  OT Frequency: Min 2X/week   Barriers to D/C:            Co-evaluation PT/OT/SLP Co-Evaluation/Treatment: Yes Reason for Co-Treatment: Complexity of the patient's impairments (multi-system involvement);For patient/therapist safety   OT goals addressed during session: ADL's and self-care;Other (comment) (functional mobility)      End of Session Equipment Utilized During Treatment: Gait belt;Rolling walker;Other (comment) (bil AFOs) Nurse Communication: Mobility status  Activity Tolerance: Patient tolerated treatment well Patient left: in bed;with call bell/phone within reach;with family/visitor present   Time: 1351-1409 OT Time Calculation (min): 18 min Charges:  OT General Charges $OT Visit: 1 Procedure OT Evaluation $OT Eval Moderate Complexity: 1 Procedure G-Codes:     Binnie Kand M.S., OTR/L [phone]  01/26/2016, 3:07 PM

## 2016-01-26 NOTE — Evaluation (Addendum)
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 MRI on 8/27 negative for acute infarct. 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   Clinical Impression  Patient demonstrates deficits in functional mobility as indicated below. Will need continued skilled PT to address deficits and maximize function. Will see as indicated and progress as tolerated. Recommend HHPT upon acute discharge.    Follow Up Recommendations Home health PT;Supervision for mobility/OOB    Equipment Recommendations  None recommended by PT    Recommendations for Other Services       Precautions / Restrictions Precautions Precautions: Fall Required Braces or Orthoses: Other Brace/Splint Other Brace/Splint: bil AFOs Restrictions Weight Bearing Restrictions: No      Mobility  Bed Mobility Overal bed mobility: Needs Assistance Bed Mobility: Supine to Sit;Sit to Supine     Supine to sit: Min assist Sit to supine: Min assist   General bed mobility comments: Assist to scoot hips to EOB. Assist for LEs back to bed.  Transfers Overall transfer level: Needs assistance Equipment used: Rolling walker (2 wheeled) Transfers: Sit to/from Stand Sit to Stand: Min guard;+2 safety/equipment         General transfer comment: Min guard for sit to stand from EOB. Min assist +2 for mobility.  Ambulation/Gait Ambulation/Gait assistance: Min assist Ambulation Distance (Feet): 40 Feet Assistive device: Rolling walker (2 wheeled) Gait Pattern/deviations:  Step-to pattern;Decreased stride length;Decreased dorsiflexion - right;Decreased dorsiflexion - left;Ataxic;Trunk flexed (toe out LLE) Gait velocity: decreased Gait velocity interpretation: <1.8 ft/sec, indicative of risk for recurrent falls General Gait Details: instability noted with minimal distance ambulation, poor coordination of stride  Stairs            Wheelchair Mobility    Modified Rankin (Stroke Patients Only) Modified Rankin (Stroke Patients Only) Pre-Morbid Rankin Score: Moderate disability Modified Rankin: Moderately severe disability     Balance Overall balance assessment: Needs assistance Sitting-balance support: Feet supported;No upper extremity supported Sitting balance-Leahy Scale: Good     Standing balance support: Bilateral upper extremity supported Standing balance-Leahy Scale: Poor Standing balance comment: RW for support                             Pertinent Vitals/Pain Pain Assessment: No/denies pain    Home Living Family/patient expects to be discharged to:: Private residence Living Arrangements: Spouse/significant other Available Help at Discharge: Family;Available 24 hours/day Type of Home: House Home Access: Level entry     Home Layout: One level Home Equipment: Walker - 4 wheels;Shower seat;Toilet riser Additional Comments: Wife uses RW for mobility also.    Prior Function Level of Independence: Independent with assistive device(s)         Comments: RW for mobility     Hand Dominance   Dominant Hand: Right    Extremity/Trunk Assessment   Upper Extremity Assessment: LUE deficits/detail       LUE Deficits / Details: Decreased shoulder strength and AROM. Overall functional during activities.          Cervical / Trunk Assessment: Kyphotic  Communication  Communication: No difficulties  Cognition Arousal/Alertness: Awake/alert Behavior During Therapy: WFL for tasks assessed/performed Overall Cognitive  Status: Within Functional Limits for tasks assessed                      General Comments      Exercises        Assessment/Plan    PT Assessment Patient needs continued PT services  PT Diagnosis Difficulty walking;Abnormality of gait   PT Problem List Decreased strength;Decreased range of motion;Decreased activity tolerance;Decreased balance;Decreased mobility;Decreased coordination  PT Treatment Interventions DME instruction;Gait training;Stair training;Functional mobility training;Therapeutic activities;Therapeutic exercise;Balance training;Patient/family education   PT Goals (Current goals can be found in the Care Plan section) Acute Rehab PT Goals Patient Stated Goal: return to being independent PT Goal Formulation: With patient Time For Goal Achievement: 02/09/16 Potential to Achieve Goals: Good    Frequency Min 4X/week   Barriers to discharge Decreased caregiver support      Co-evaluation PT/OT/SLP Co-Evaluation/Treatment: Yes Reason for Co-Treatment: Complexity of the patient's impairments (multi-system involvement);For patient/therapist safety   OT goals addressed during session: ADL's and self-care;Other (comment) (functional mobility)       End of Session Equipment Utilized During Treatment: Gait belt Activity Tolerance: Other (comment) (limited by arrival of transport for MRI) Patient left: in bed;Other (comment) (with transport) Nurse Communication: Mobility status         Time: 1350-1408 PT Time Calculation (min) (ACUTE ONLY): 18 min   Charges:   PT Evaluation $PT Eval Moderate Complexity: 1 Procedure     PT G CodesDuncan Dull 07-Feb-2016, 3:14 PM Alben Deeds, PT DPT  [phone]    02/07/16 1353  PT G-Codes **NOT FOR INPATIENT CLASS**  Functional Assessment Tool Used clinical judgement  Functional Limitation Mobility: Walking and moving around  Mobility: Walking and Moving Around Current Status [id number]) CJ  Mobility:  Walking and Moving Around Goal Status [id number]) CI

## 2016-01-26 NOTE — Progress Notes (Signed)
PROGRESS NOTE  Phillip Chandler  [id number] DOB: [birth year] DOA: 01/25/2016 PCP: Houston Siren, MD  Brief Narrative:   Phillip Chandler is a 82 y.o. 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  His left arm dysmetria resolved quickly, but he continued to have difficulty controlling his left leg which kept externally rotating, swinging in to hit his right heel.  He had to wear his braces and use his hands to lift and control his left leg and could barely get around.  his symptoms are still present this morning.  He presented to the [facility] ER and was found to be hypertension.  CT head was negative for acute change and was transferred to [facility] for suspected acute stroke.  His left upper extremity dysmetria, sudden onset hypertension and ongoing left lower extremity weakness suggest recent stroke, but MRI is negative.  Blood pressures have improved.  TIA unlikely given persistence of his left lower extremity dysmetria.    Assessment & Plan:   Principal Problem:   Weakness of left leg Active Problems:   Diabetes mellitus (HCC)   Stroke (HCC)   Atrial fibrillation (HCC)   Diastolic heart failure (HCC)   Accelerated hypertension   Undiagnosed cardiac murmurs   CVA (cerebral infarction)  Signs and symptoms concerning for recurrent acute CVA, however MRI negative --Telemetry:  Rate controlled a-fib with occasional NSVT --Appreciate Neurology assistance - Continue Xarelto --A1c pending -  Lipid panel:  LDL 64 --Swallow screen passed --PT/OT pending -  SLP not needed  Heart murmur --Complete echo per stroke  protocol  Accelerated HTN, discussed with RN.  -  Increase labetalol -  Increase clonidine to BID --Losartan  --Continue home diuretics  Chronic a fib, rate controlled --Continue Xarelto for now --continue labetalol, Digoxin  Diastolic CHF --Chronic LE edema otherwise asymptomatic --Continue home doses of lasix, spironolactone for now  DVT prophylaxis:  xarelto Code Status:  full Family Communication:  patient and his daughter Disposition Plan:  Pending completion of stroke work up, reevaluation by neurology   Consultants:   Neurology  Procedures:  none  Antimicrobials:   none    Subjective: Still unsteady and has difficulty controlling his left foot when standing up.  Left arm feels back to normal.  Denies slurred speech, confusion, difficulty speaking.    Objective: Vitals:   01/26/16 0300 01/26/16 0400 01/26/16 0600 01/26/16 1020  BP: (!) 167/74 (!) 191/109 (!) 164/97 (!) 162/93  Pulse: 69 79 78 61  Resp: 16 16  18   Temp:    98.7 F (37.1 C)  TempSrc:    Oral  SpO2: 96%  98% 98%  Weight:      Height:       No intake or output data in the 24 hours ending 01/26/16 1115 Filed Weights   01/25/16 2000  Weight: 122.2 kg (269 lb 6.4 oz)    Examination:  General exam:  Adult male.  No acute distress.  HEENT:  NCAT, MMM Respiratory system: Clear to auscultation bilaterally Cardiovascular system:  IRRR, normal 99991111. 3/6 systolic murmur.  Warm extremities Gastrointestinal system: Normal active bowel sounds, soft, nondistended, nontender. MSK:  Normal tone and  bulk, no lower extremity edema Neuro:  Foot drop on the left.  CN II-XII grossly intact, strength 5/5 bilateral upper extremities and right lower extremity.  Left hip strength, knee flexion/extension appear intact.  Able to wiggle toes a little and move ankle in plantarflexion.  Mild dysmetria in the left leg.  Sensation diminished equally on both feet, intact on arms.    Data Reviewed: I have  personally reviewed following labs and imaging studies  CBC:  Recent Labs Lab 01/26/16 0442  WBC 10.3  HGB 12.6*  HCT 40.0  MCV 89.3  PLT [id number]   Basic Metabolic Panel:  Recent Labs Lab 01/26/16 0442  NA 138  K 3.6  CL 102  CO2 26  GLUCOSE 173*  BUN 10  CREATININE 0.75  CALCIUM 9.3   GFR: Estimated Creatinine Clearance: 87.6 mL/min (by C-G formula based on SCr of 0.8 mg/dL). Liver Function Tests: No results for input(s): AST, ALT, ALKPHOS, BILITOT, PROT, ALBUMIN in the last 168 hours. No results for input(s): LIPASE, AMYLASE in the last 168 hours. No results for input(s): AMMONIA in the last 168 hours. Coagulation Profile: No results for input(s): INR, PROTIME in the last 168 hours. Cardiac Enzymes: No results for input(s): CKTOTAL, CKMB, CKMBINDEX, TROPONINI in the last 168 hours. BNP (last 3 results) No results for input(s): PROBNP in the last 8760 hours. HbA1C: No results for input(s): HGBA1C in the last 72 hours. CBG:  Recent Labs Lab 01/25/16 2256 01/26/16 0627  GLUCAP 167* 163*   Lipid Profile:  Recent Labs  01/26/16 0442  CHOL 120  HDL 26*  LDLCALC 64  TRIG 148  CHOLHDL 4.6   Thyroid Function Tests: No results for input(s): TSH, T4TOTAL, FREET4, T3FREE, THYROIDAB in the last 72 hours. Anemia Panel: No results for input(s): VITAMINB12, FOLATE, FERRITIN, TIBC, IRON, RETICCTPCT in the last 72 hours. Urine analysis: No results found for: COLORURINE, APPEARANCEUR, LABSPEC, PHURINE, GLUCOSEU, HGBUR, BILIRUBINUR, KETONESUR, PROTEINUR, UROBILINOGEN, NITRITE, LEUKOCYTESUR Sepsis Labs: @LABRCNTIP (procalcitonin:4,lacticidven:4)  )No results found for this or any previous visit (from the past 240 hour(s)).    Radiology Studies: Mr Brain Wo Contrast  Result Date: 01/26/2016 CLINICAL DATA:  Acute onset LEFT lower extremity weakness and sensory deficits. 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 History of  hypertension, stroke, diabetes. EXAM: MRI HEAD WITHOUT CONTRAST MRA HEAD WITHOUT CONTRAST TECHNIQUE: Multiplanar, multiecho pulse sequences of the brain and surrounding structures were obtained without intravenous contrast. Angiographic images of the head were obtained using MRA technique without contrast. COMPARISON:  None. FINDINGS: MRI HEAD FINDINGS INTRACRANIAL CONTENTS: No reduced diffusion to suggest acute ischemia. Scattered small foci of susceptibility artifact scattered throughout infra and supratentorial brain including deep gray nuclei. Susceptibility artifact associated with old RIGHT basal ganglia infarct. Ventricles and sulci are normal for patient's age. Patchy supratentorial and pontine white matter FLAIR T2 hyperintensities. No midline shift, mass effect or mass lesions. No abnormal extra-axial fluid collections. Minimal susceptibility artifact within posterior fossa extra-axial space, suggesting old subarachnoid hemorrhage or possibly from prior spine surgery. ORBITS: The included ocular globes and orbital contents are non-suspicious. Status post bilateral ocular lens implants. SINUSES: Small RIGHT mucosal retention cyst. Mastoid air cells are well aerated. SKULL/SOFT TISSUES: No abnormal sellar expansion. No suspicious calvarial bone marrow signal. Craniocervical junction maintained. Patient is edentulous. MRA HEAD FINDINGS: ANTERIOR CIRCULATION: Tortuous included cervical internal carotid arteries, associated with chronic hypertension. Flow related enhancement of the bilateral internal carotid arteries through the level of the carotid terminus.  Symmetric flow related enhancement of the ophthalmic arteries. RIGHT knee A2 segment origin infundibulum without discrete aneurysm. Patent bilateral anterior cerebral arteries. Flow related enhancement of bilateral middle cerebral arteries. No large vessel occlusion, high-grade stenosis, abnormal luminal irregularity, aneurysm. Mild dolichoectasia.  POSTERIOR CIRCULATION: LEFT vertebral artery is dominant. Basilar artery is patent, with normal flow related enhancement of the main branch vessels. Mild stenosis RIGHT P1 segment. Flow related enhancement of bilateral posterior cerebral arteries. No large vessel occlusion, high-grade stenosis, abnormal luminal irregularity, aneurysm. Mild dolichoectasia. IMPRESSION: MRI HEAD: No acute intracranial process, no acute ischemia. 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 Scattered susceptibility artifact compatible chronic hypertension. Involutional changes and moderate chronic small vessel ischemic disease. MRA HEAD: No emergent large vessel occlusion or severe stenosis. Dolichoectasia associated with chronic hypertension. Electronically Signed   By: Elon Alas M.D.   On: 01/26/2016 03:21   [name] Head/brain [id number] Cm  Result Date: 01/26/2016 CLINICAL DATA:  Acute onset LEFT lower extremity weakness and sensory deficits. 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 History of hypertension, stroke, diabetes. EXAM: MRI HEAD WITHOUT CONTRAST MRA HEAD WITHOUT CONTRAST TECHNIQUE: Multiplanar, multiecho pulse sequences of the brain and surrounding structures were obtained without intravenous contrast. Angiographic images of the head were obtained using MRA technique without contrast. COMPARISON:  None. FINDINGS: MRI HEAD FINDINGS INTRACRANIAL CONTENTS: No reduced diffusion to suggest acute ischemia. Scattered small foci of susceptibility artifact scattered throughout infra and supratentorial brain including deep gray nuclei. Susceptibility artifact associated with old RIGHT basal ganglia infarct. Ventricles and sulci are normal for patient's age. Patchy supratentorial and pontine white matter FLAIR T2 hyperintensities. No midline shift, mass effect or mass lesions. No abnormal extra-axial fluid collections. Minimal susceptibility artifact within posterior fossa extra-axial space,  suggesting old subarachnoid hemorrhage or possibly from prior spine surgery. ORBITS: The included ocular globes and orbital contents are non-suspicious. Status post bilateral ocular lens implants. SINUSES: Small RIGHT mucosal retention cyst. Mastoid air cells are well aerated. SKULL/SOFT TISSUES: No abnormal sellar expansion. No suspicious calvarial bone marrow signal. Craniocervical junction maintained. Patient is edentulous. MRA HEAD FINDINGS: ANTERIOR CIRCULATION: Tortuous included cervical internal carotid arteries, associated with chronic hypertension. Flow related enhancement of the bilateral internal carotid arteries through the level of the carotid terminus. Symmetric flow related enhancement of the ophthalmic arteries. RIGHT knee A2 segment origin infundibulum without discrete aneurysm. Patent bilateral anterior cerebral arteries. Flow related enhancement of bilateral middle cerebral arteries. No large vessel occlusion, high-grade stenosis, abnormal luminal irregularity, aneurysm. Mild dolichoectasia. POSTERIOR CIRCULATION: LEFT vertebral artery is dominant. Basilar artery is patent, with normal flow related enhancement of the main branch vessels. Mild stenosis RIGHT P1 segment. Flow related enhancement of bilateral posterior cerebral arteries. No large vessel occlusion, high-grade stenosis, abnormal luminal irregularity, aneurysm. Mild dolichoectasia. IMPRESSION: MRI HEAD: No acute intracranial process, no acute ischemia. 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 Scattered susceptibility artifact compatible chronic hypertension. Involutional changes and moderate chronic small vessel ischemic disease. MRA HEAD: No emergent large vessel occlusion or severe stenosis. Dolichoectasia associated with chronic hypertension. Electronically Signed   By: Elon Alas M.D.   On: 01/26/2016 03:21     Scheduled Meds: . cloNIDine  0.1 mg Oral BID  . digoxin  0.125 mg Oral Daily  . fluticasone  2 spray Each  Nare Daily  . furosemide  40 mg Oral Daily  . insulin aspart  0-15 Units Subcutaneous TID WC  . insulin glargine  25 Units Subcutaneous Q2200  .  labetalol  300 mg Oral BID  . loratadine  10 mg Oral Daily  . losartan  100 mg Oral Daily  . mesalamine  1.2 g Oral Q breakfast  . montelukast  10 mg Oral QHS  . pantoprazole  40 mg Oral Daily  . potassium chloride SA  20 mEq Oral TID  . pregabalin  100 mg Oral BID WC  . pregabalin  200 mg Oral QHS  . rivaroxaban  20 mg Oral Q supper  . simvastatin  20 mg Oral QHS  . spironolactone  12.5 mg Oral Daily   Continuous Infusions:    LOS: 1 day    Time spent: 30 min    Janece Canterbury, MD [facility] Pager [phone]  If 7PM-7AM, please contact night-coverage [email or web address] Password [email or web address] 01/26/2016, 11:15 AM

## 2016-01-26 NOTE — Progress Notes (Signed)
STROKE TEAM PROGRESS NOTE   HISTORY OF PRESENT ILLNESS (per record) Phillip Chandler is an 82 y.o. male with a history of atrial fibrillation on Xarelto, hypertension, hyperlipidemia, CHF and diabetes mellitus, transferred from [facility] for management of possible acute stroke. Patient noticed acute weakness involving his left lower extremity at 9 AM this morning. Since then he's been unable to stand or walk without assistance. Current presenting symptoms are in addition to his known left foot drop from lumbar radiculopathy. CT scan of his head showed no acute intracranial abnormality. MRI was unavailable at [facility]. He had no symptoms involving his left upper extremity. There were no speech changes nor signs of facial droop. He has a history of previous stroke and 2005 which affected his right extremities. Recovery reportedly was excellent. MRI following admission to [facility] - no evidence of an acute infarction.  LSN: 9:00 AM on 01/25/2016 tPA Given: No: On anticoagulation with Xaralto mRankin:  SUBJECTIVE (INTERVAL HISTORY) His daughter is at the bedside.  Overall he feels his condition is unchanged. He started that he was walking yesterday and felt sudden weakness of LLE and not able to control the LLE. He had some dizziness too. He denies any LUE weakness, or right sided weakness, no numbness, no neck or back pain, no bowel bladder difficulty. MRI brain and MRA so far negative for stroke or vessel occlusion. He stated that his LLE weakness no change comparing with yesterday.   He had LUE mild weakness for many years currently undergoing outpt PT, no change of strength recently. 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 He had back surgery with Dr. Saintclair Halsted in 2014, currently using b/l AFOs. He is at baseline active and walking around the neighborhood.    OBJECTIVE Temp:  [98.4 F (36.9 C)-98.8 F (37.1 C)] 98.8 F (37.1 C) (08/26  2000) Pulse Rate:  [64-81] 78 (08/27 0600) Cardiac Rhythm: Atrial fibrillation (08/27 0300) Resp:  [16-18] 16 (08/27 0400) BP: (164-215)/(74-109) 164/97 (08/27 0600) SpO2:  [96 %-98 %] 98 % (08/27 0600) Weight:  [122.2 kg (269 lb 6.4 oz)] 122.2 kg (269 lb 6.4 oz) (08/26 2000)  CBC:  Recent Labs Lab 01/26/16 0442  WBC 10.3  HGB 12.6*  HCT 40.0  MCV 89.3  PLT A999333    Basic Metabolic Panel:  Recent Labs Lab 01/26/16 0442  NA 138  K 3.6  CL 102  CO2 26  GLUCOSE 173*  BUN 10  CREATININE 0.75  CALCIUM 9.3    Lipid Panel:    Component Value Date/Time   CHOL 120 01/26/2016 0442   TRIG 148 01/26/2016 0442   HDL 26 (L) 01/26/2016 0442   CHOLHDL 4.6 01/26/2016 0442   VLDL 30 01/26/2016 0442   LDLCALC 64 01/26/2016 0442   HgbA1c: No results found for: HGBA1C Urine Drug Screen: No results found for: LABOPIA, COCAINSCRNUR, LABBENZ, AMPHETMU, THCU, LABBARB    IMAGING I have personally reviewed the radiological images below and agree with the radiology interpretations.  [name] Head/brain Wo Cm 01/26/2016  MRI HEAD:  No acute intracranial process, no acute ischemia. 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 Scattered susceptibility artifact compatible chronic hypertension. Involutional changes and moderate chronic small vessel ischemic disease.   MRA HEAD:  No emergent large vessel occlusion or severe stenosis. Dolichoectasia associated with chronic hypertension.   TTE - pending  CUS - pending    PHYSICAL EXAM  Temp:  [  98.4 F (36.9 C)-98.8 F (37.1 C)] 98.7 F (37.1 C) (08/27 1020) Pulse Rate:  [61-81] 61 (08/27 1020) Resp:  [16-18] 18 (08/27 1020) BP: (162-215)/(74-109) 162/93 (08/27 1020) SpO2:  [96 %-98 %] 98 % (08/27 1020) Weight:  [269 lb 6.4 oz (122.2 kg)] 269 lb 6.4 oz (122.2 kg) (08/26 2000)  General - Well nourished, well developed, in no apparent distress.  Ophthalmologic - Fundi not visualized due to eye movement.  Cardiovascular - Regular  rate and rhythm.  Mental Status -  Level of arousal and orientation to time, place, and person were intact. Language including expression, naming, repetition, comprehension was assessed and found intact. Fund of Knowledge was assessed and was intact.  Cranial Nerves II - XII - II - Visual field intact OU. III, IV, VI - Extraocular movements intact. V - Facial sensation intact bilaterally. VII - Facial movement intact bilaterally. VIII - Hearing & vestibular intact bilaterally. X - Palate elevates symmetrically. XI - Chin turning & shoulder shrug intact bilaterally. XII - Tongue protrusion intact.  Motor Strength - The patient's strength was 5/5 LUE deltoid, bicep and tricep, 4+/5 finger grip and dexterity. LLE 4/5 iliopsoas, 5-/5 knee extension and 0/5 DF and RLE 5/5 and pronator drift was absent.  Bulk was normal and fasciculations were absent.   Motor Tone - Muscle tone was assessed at the neck and appendages and was normal.  Reflexes - The patient's reflexes were 1+ in all extremities and he had no pathological reflexes.  Sensory - Light touch, temperature/pinprick were assessed and were symmetrical.    Coordination - The patient had normal movements in the hands with no ataxia or dysmetria.  Tremor was absent.  Gait and Station - standing with two assist, able to lift up LLE but felt weak not able to control, did not attempt for walking.   ASSESSMENT/PLAN [name] is a 83 y.o. male with history of atrial fibrillation on Xarelto, diabetes mellitus, known left foot drop from lumbar radiculopathy, hyperlipidemia, congestive heart failure, and anxiety presenting with left lower extremity weakness. He did not receive IV t-PA due to anticoagulation on Xarelto.  LLE weakness - needs to rule out myelopathy - pt sudden onset LLE weakness without pain or sensation changes or b/b dysfunction, less likely myelopathy. However, in the setting of constant symptoms and negative MRI  brain as well as previous spine surgery, myelopathy needs to ruled out.  Resultant  LLE weakness  MRI brain - No acute intracranial process.Old small RIGHT basal ganglia hemorrhagic infarct.  MRA brain - No emergent large vessel occlusion or severe stenosis  MRI C/T/L spine pending  Carotid Doppler - pending  2D Echo pending  LDL - 64  HgbA1c pending  VTE prophylaxis - Xarelto  Diet heart healthy/carb modified Room service appropriate? Yes; Fluid consistency: Thin  Xarelto (rivaroxaban) daily prior to admission, now on Xarelto (rivaroxaban) daily  Patient counseled to be compliant with his antithrombotic medications  Ongoing aggressive stroke risk factor management  Therapy recommendations: pending  Disposition: pending  Afib  Chronic  On Xarelto - stated compliance  Continue Xarelto  Rate controlled  Hypertension  Blood pressure high on admission - on labetalol, clonidine, Lasix, Cozaar  Stated good BP control at home  Long-term BP goal normotensive  Hyperlipidemia  Home meds:  Zocor 40 mg daily resumed in hospital  LDL 64, goal < 70  Continue statin at discharge.  Diabetes  HgbA1c pending, goal < 7.0  CBG monitoring  Other  Stroke Risk Factors  Advanced age  The patient quit smoking 8 years ago.  Obesity, Body mass index is 42.19 kg/m., recommend weight loss, diet and exercise as appropriate   Hx stroke/TIA in 2005 - old right basal ganglia hemorrhagic infarct on MRI  Other Active Problems  Hx of lumbar spine surgery in 2014 with Dr. Saintclair Halsted  Hx of sacral coccygeal spine surgery to remove benign tumor in 2005 - residue foot drop  Long standing LUE mild weakness, mainly left hand  Hospital day # 1  Rosalin Hawking, MD PhD Stroke Neurology 01/26/2016 12:41 PM   To contact Stroke Continuity provider, please refer to [email or web address] After hours, contact General Neurology

## 2016-01-27 ENCOUNTER — Encounter (HOSPITAL_COMMUNITY): Payer: Self-pay | Admitting: *Deleted

## 2016-01-27 ENCOUNTER — Inpatient Hospital Stay (HOSPITAL_BASED_OUTPATIENT_CLINIC_OR_DEPARTMENT_OTHER): Payer: Medicare Other

## 2016-01-27 ENCOUNTER — Other Ambulatory Visit (HOSPITAL_COMMUNITY): Payer: Medicare Other

## 2016-01-27 DIAGNOSIS — M4804 Spinal stenosis, thoracic region: Secondary | ICD-10-CM

## 2016-01-27 DIAGNOSIS — I482 Chronic atrial fibrillation: Secondary | ICD-10-CM | POA: Diagnosis not present

## 2016-01-27 DIAGNOSIS — M4802 Spinal stenosis, cervical region: Secondary | ICD-10-CM

## 2016-01-27 DIAGNOSIS — I10 Essential (primary) hypertension: Secondary | ICD-10-CM | POA: Diagnosis not present

## 2016-01-27 DIAGNOSIS — I5032 Chronic diastolic (congestive) heart failure: Secondary | ICD-10-CM | POA: Diagnosis not present

## 2016-01-27 DIAGNOSIS — R531 Weakness: Secondary | ICD-10-CM | POA: Diagnosis not present

## 2016-01-27 DIAGNOSIS — R29898 Other symptoms and signs involving the musculoskeletal system: Secondary | ICD-10-CM

## 2016-01-27 LAB — GLUCOSE, CAPILLARY
Glucose-Capillary: 159 mg/dL — ABNORMAL HIGH (ref 65–99)
Glucose-Capillary: 215 mg/dL — ABNORMAL HIGH (ref 65–99)
Glucose-Capillary: 161 mg/dL — ABNORMAL HIGH (ref 65–99)
Glucose-Capillary: 221 mg/dL — ABNORMAL HIGH (ref 65–99)

## 2016-01-27 LAB — HEMOGLOBIN A1C
Hgb A1c MFr Bld: 8.4 % — ABNORMAL HIGH (ref 4.8–5.6)
Mean Plasma Glucose: 194 mg/dL

## 2016-01-27 MED ORDER — CLONIDINE HCL 0.1 MG PO TABS: 0.1000 mg | ORAL_TABLET | Freq: Two times a day (BID) | ORAL | 0 refills | Status: AC

## 2016-01-27 NOTE — Progress Notes (Signed)
STROKE TEAM PROGRESS NOTE   SUBJECTIVE (INTERVAL HISTORY) No family is at the bedside. His MRI C/T/L reviewed with Dr. Arnoldo Morale yesterday and he has cervical spinal cord compression as well thoracic spinal cord compression. Due to lack of LU symptoms, likely the LLE weakness is due to thoracic spinal cord compression. Will have Dr. Saintclair Halsted come over to provide opinion today.    OBJECTIVE Temp:  [97.7 F (36.5 C)-98 F (36.7 C)] 97.7 F (36.5 C) (08/28 1353) Pulse Rate:  [61-82] 62 (08/28 1353) Cardiac Rhythm: Atrial fibrillation (08/28 0725) Resp:  [16-20] 16 (08/28 1353) BP: (103-181)/(55-82) 103/55 (08/28 1353) SpO2:  [94 %-99 %] 94 % (08/28 1353)  CBC:   Recent Labs Lab 01/26/16 0442  WBC 10.3  HGB 12.6*  HCT 40.0  MCV 89.3  PLT [id number]    Basic Metabolic Panel:   Recent Labs Lab 01/26/16 0442  NA 138  K 3.6  CL 102  CO2 26  GLUCOSE 173*  BUN 10  CREATININE 0.75  CALCIUM 9.3    Lipid Panel:     Component Value Date/Time   CHOL 120 01/26/2016 0442   TRIG 148 01/26/2016 0442   HDL 26 (L) 01/26/2016 0442   CHOLHDL 4.6 01/26/2016 0442   VLDL 30 01/26/2016 0442   LDLCALC 64 01/26/2016 0442   HgbA1c:  Lab Results  Component Value Date   HGBA1C 8.4 (H) 01/26/2016   Urine Drug Screen: No results found for: LABOPIA, COCAINSCRNUR, LABBENZ, AMPHETMU, THCU, LABBARB    IMAGING I have personally reviewed the radiological images below and agree with the radiology interpretations.  Phillip Chandler Head/brain Wo Cm 01/26/2016  MRI HEAD:  No acute intracranial process, no acute ischemia. 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 Scattered susceptibility artifact compatible chronic hypertension. Involutional changes and moderate chronic small vessel ischemic disease.   MRA HEAD:  No emergent large vessel occlusion or severe stenosis. Dolichoectasia associated with chronic hypertension.   Phillip C/T/L Spine 01/26/2016 IMPRESSION: Cervical spondylosis at multiple levels. Largest  osteophyte on the left at C5-6 with cord flattening and moderate spinal stenosis. Multilevel thoracic degenerative change. Moderate spinal stenosis at T10-11 with marked foraminal narrowing on the right and moderate foraminal narrowing on the left. Mild spinal stenosis at T11-12 and T12-L1 Lumbar fusion L4-5 and L5-S1 Severe spinal stenosis at L3-4 with severe foraminal encroachment bilaterally due to diffuse endplate osteophyte formation. Moderate spinal stenosis L1-2. Moderate foraminal stenosis bilaterally at L1-2.   CUS - Bilateral - 1% to 39% ICA stenosis. Right vertebral artery and bilateral subclavian not visualized due to respiratory interference    PHYSICAL EXAM  Temp:  [97.7 F (36.5 C)-98 F (36.7 C)] 97.7 F (36.5 C) (08/28 1353) Pulse Rate:  [61-82] 62 (08/28 1353) Resp:  [16-20] 16 (08/28 1353) BP: (103-181)/(55-82) 103/55 (08/28 1353) SpO2:  [94 %-99 %] 94 % (08/28 1353)  General - Well nourished, well developed, in no apparent distress.  Ophthalmologic - Fundi not visualized due to eye movement.  Cardiovascular - Regular rate and rhythm.  Mental Status -  Level of arousal and orientation to time, place, and person were intact. Language including expression, naming, repetition, comprehension was assessed and found intact. Fund of Knowledge was assessed and was intact.  Cranial Nerves II - XII - II - Visual field intact OU. III, IV, VI - Extraocular movements intact. V - Facial sensation intact bilaterally. VII - Facial movement intact bilaterally. VIII - Hearing & vestibular intact bilaterally. X - Palate elevates symmetrically. XI -  Chin turning & shoulder shrug intact bilaterally. XII - Tongue protrusion intact.  Motor Strength - The patient's strength was 5/5 LUE deltoid, bicep and tricep, 4+/5 finger grip and dexterity. LLE 4/5 iliopsoas, 5-/5 knee extension and 0/5 DF and RLE 5/5 and pronator drift was absent.  Bulk was normal and fasciculations were absent.    Motor Tone - Muscle tone was assessed at the neck and appendages and was normal.  Reflexes - The patient's reflexes were 1+ in all extremities and he had no pathological reflexes.  Sensory - Light touch, temperature/pinprick were assessed and were symmetrical.    Coordination - The patient had normal movements in the hands with no ataxia or dysmetria.  Tremor was absent.  Gait and Station - deferred   ASSESSMENT/PLAN Phillip Chandler is a 82 y.o. male with history of atrial fibrillation on Xarelto, diabetes mellitus, known left foot drop from lumbar radiculopathy, hyperlipidemia, congestive heart failure, and anxiety presenting with left lower extremity weakness. He did not receive IV t-PA due to anticoagulation on Xarelto.  Myelopathy - most likely thoracic myelopathy is the etiology this time. Cervical spinal cord compression could be chronic.   Resultant  LLE weakness  MRI brain - No acute intracranial process.Old small RIGHT basal ganglia hemorrhagic infarct.  MRA brain - No emergent large vessel occlusion or severe stenosis  MRI C/T/L spine - T10-11 myelopathy, C5-6 spinal cord compression could be chronic, C/T/L spinal stenosis  Carotid Doppler unremarkable  LDL - 64  HgbA1c 8.4  VTE prophylaxis - Xarelto Diet heart healthy/carb modified Room service appropriate? Yes; Fluid consistency: Thin Diet Carb Modified  Xarelto (rivaroxaban) daily prior to admission, now on Xarelto (rivaroxaban) daily  Patient counseled to be compliant with his antithrombotic medications  Ongoing aggressive stroke risk factor management  Afib  Chronic  On Xarelto - stated compliance  Continue Xarelto  Rate controlled  Hypertension  Blood pressure high on admission - on labetalol, clonidine, Lasix, Cozaar  Stated good BP control at home Long-term BP goal normotensive  Hyperlipidemia  Home meds:  Zocor 40 mg daily resumed in hospital  LDL 64, goal < 70  Continue statin  at discharge.  Diabetes  HgbA1c 8.4, goal < 7.0  DM uncontrolled  CBG monitoring  SSI  Other Stroke Risk Factors  Advanced age  The patient quit smoking 8 years ago.  Obesity, Body mass index is 42.19 kg/m., recommend weight loss, diet and exercise as appropriate   Hx stroke/TIA in 2005 - old right basal ganglia hemorrhagic infarct on MRI  Other Active Problems  Hx of lumbar spine surgery in 2014 with Dr. 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 Please call with questions. No need neuro follow up at this time. Thanks for the consult.   Rosalin Hawking, MD PhD Stroke Neurology 01/27/2016 3:29 PM   To contact Stroke Continuity provider, please refer to [email or web address] After hours, contact General Neurology

## 2016-01-27 NOTE — Progress Notes (Signed)
Patient requires 24 hour supervision and assistance at this time.  Family unable to provide and requesting SNF placement at [facility].  SW consult placed.  Family aware that patient is observation status and his insurance may not cover SNF placement.

## 2016-01-27 NOTE — Consult Note (Signed)
Reason for Consult: Left leg weakness Referring Physician: Dr. Kirt Chandler is an 82 y.o. male.  HPI: The patient is an 82 year old white male on whom Dr. Saintclair Chandler performed an L4-5 and L5-S1 decompression, instrumentation, and fusion years ago. The patient initially did well. About a year ago he began having some increasing back pain. 2 days ago the patient began noticing some weakness in his left leg making it difficult for him to ambulate. He went to the [facility] ER and was worked up with a brain MRI which was negative. He was transferred to [facility] and worked up further with a cervical, thoracic and lumbar MRI. There was stenosis at each of these regions. A neurosurgical consultation was requested.  Presently the patient is alert and pleasant. He looks well. He is in no apparent distress. He says his leg is weak and he has difficulty walking. He denies pain or numbness in his leg i.e. doesn't seem to be having much radicular symptoms. The patient has a chronic left foot drop since his surgery in 1991. This has not changed. He denies neck pain or arm symptoms. He doesn't have any problems with his right leg. He did not have any other symptoms such as vision loss, slurred speech, etc.  Past Medical History:  Diagnosis Date  . Anxiety   . Arthritis   . Cancer ([place])    basal and squamous cell face  . CHF (congestive heart failure) ([place])   . Complication of anesthesia   . Diabetes mellitus without complication ([place])   . Diverticula of colon   . Dysrhythmia    afib hx of  . GERD (gastroesophageal reflux disease)   . Hypertension   . Pneumonia    10/2011  . PONV (postoperative nausea and vomiting)   . Stroke [facility]) 2005    Past Surgical History:  Procedure Laterality Date  . BACK SURGERY  1991  . EYE SURGERY     Cartaract bil  . HERNIA REPAIR     Umbicilal-2011  . JOINT REPLACEMENT     Bil Knee  . SHOULDER FUSION      No family history on file.  Social  History:  reports that he has quit smoking. He quit after 8.00 years of use. He has never used smokeless tobacco. He reports that he does not drink alcohol or use drugs.  Allergies:  Allergies  Allergen Reactions  . Altace [Ramipril] Shortness Of Breath and Rash  . Bee Venom Swelling    Mouth swelling - treated with benadryl  . Hydromorphone Anaphylaxis  . Fentanyl Other (See Comments)    Personality change- cried a lot (reaction to combination of versed and fentanyl)  . Versed [Midazolam] Other (See Comments)    Change in personality (reaction to combination of fentanyl and versed)  . Canagliflozin-Metformin Hcl Other (See Comments)    dehydration  . Exenatide Other (See Comments)    Leg problems  . Nsaids Other (See Comments)    Pt cannot take with xarelto  . Pneumococcal Vaccine Swelling    Swelling at injection site  . Sulfa Antibiotics Rash    Medications:  I have reviewed the patient's current medications. Prior to Admission:  Prescriptions Prior to Admission  Medication Sig Dispense Refill Last Dose  . albuterol (PROAIR HFA) 108 (90 BASE) MCG/ACT inhaler Inhale 2 puffs into the lungs every 4 (four) hours as needed for wheezing or shortness of breath.    01/25/2016 at 1500  .  ammonium lactate (AMLACTIN) 12 % cream Apply topically 3 (three) times daily as needed (spots on arm). Take as directed.   01/25/2016 at Unknown time  . cloNIDine (CATAPRES) 0.1 MG tablet Take 0.1 mg by mouth daily.    01/25/2016 at Unknown time  . digoxin (LANOXIN) 0.125 MG tablet Take 0.125 mg by mouth daily.    01/25/2016 at Unknown time  . fluticasone (FLONASE) 50 MCG/ACT nasal spray Place 2 sprays into both nostrils daily.    01/25/2016 at Unknown time  . furosemide (LASIX) 40 MG tablet Take 40 mg by mouth daily.   01/25/2016 at Unknown time  . HYDROcodone-acetaminophen (NORCO/VICODIN) 5-325 MG per tablet Take 1 tablet by mouth every 4 (four) hours as needed (pain).    01/24/2016 at Unknown time  .  Insulin Glargine (LANTUS SOLOSTAR) 100 UNIT/ML Solostar Pen Inject 25 Units into the skin daily at 10 pm.   01/24/2016 at Unknown time  . labetalol (NORMODYNE) 200 MG tablet Take 100 mg by mouth 2 (two) times daily.    01/25/2016 at 800  . loratadine (CLARITIN) 10 MG tablet Take 10 mg by mouth daily.   01/25/2016 at Unknown time  . losartan (COZAAR) 100 MG tablet Take 100 mg by mouth daily.    01/25/2016 at Unknown time  . mesalamine (LIALDA) 1.2 G EC tablet Take 1.2 g by mouth daily with breakfast.    01/25/2016 at Unknown time  . montelukast (SINGULAIR) 10 MG tablet Take 10 mg by mouth at bedtime.    01/24/2016 at Unknown time  . omega-3 acid ethyl esters (LOVAZA) 1 g capsule Take 1 g by mouth 2 (two) times daily.   01/24/2016 at pm  . omeprazole (PRILOSEC) 40 MG capsule Take 40 mg by mouth daily.    01/25/2016 at Unknown time  . potassium chloride SA (KLOR-CON M20) 20 MEQ tablet Take 20 mEq by mouth 3 (three) times daily.    01/25/2016 at noon  . pregabalin (LYRICA) 100 MG capsule Take 100 mg by mouth See admin instructions. Take 1 capsule (100 mg) by mouth every morning, noon and 5pm (take 200 mg capsule at bedtime)   01/25/2016 at noon  . pregabalin (LYRICA) 200 MG capsule Take 200 mg by mouth at bedtime.   01/24/2016 at Unknown time  . rivaroxaban (XARELTO) 20 MG TABS tablet Take 20 mg by mouth daily with supper.    01/24/2016 at 1800  . simvastatin (ZOCOR) 40 MG tablet Take 20 mg by mouth at bedtime.    01/24/2016 at Unknown time  . sitaGLIPtin-metformin (JANUMET) 50-500 MG per tablet Take 1 tablet by mouth 2 (two) times daily with a meal.    01/25/2016 at am  . spironolactone (ALDACTONE) 25 MG tablet Take 12.5 mg by mouth daily.    01/25/2016 at Unknown time  . [DISCONTINUED] amiodarone (PACERONE) 200 MG tablet Take 1 tablet (200 mg total) by mouth 2 (two) times daily. (Patient not taking: Reported on 01/25/2016) 60 tablet 1 Not Taking at Unknown time  . [DISCONTINUED] labetalol (NORMODYNE) 300 MG tablet  Take 1 tablet (300 mg total) by mouth 2 (two) times daily. (Patient not taking: Reported on 01/25/2016) 60 tablet 1 Not Taking at Unknown time   Scheduled: . cloNIDine  0.1 mg Oral BID  . digoxin  0.125 mg Oral Daily  . diphenhydrAMINE  25 mg Intravenous Once  . fluticasone  2 spray Each Nare Daily  . furosemide  40 mg Oral Daily  . insulin aspart  0-15  Units Subcutaneous TID WC  . insulin glargine  25 Units Subcutaneous Q2200  . labetalol  300 mg Oral BID  . loratadine  10 mg Oral Daily  . losartan  100 mg Oral Daily  . mesalamine  1.2 g Oral Q breakfast  . montelukast  10 mg Oral QHS  . pantoprazole  40 mg Oral Daily  . potassium chloride SA  20 mEq Oral TID  . pregabalin  100 mg Oral BID WC  . pregabalin  200 mg Oral QHS  . rivaroxaban  20 mg Oral Q supper  . simvastatin  20 mg Oral QHS  . spironolactone  12.5 mg Oral Daily   Continuous:  [id number] **OR** acetaminophen, hydrALAZINE, HYDROcodone-acetaminophen, ipratropium-albuterol Anti-infectives    None       Results for orders placed or performed during the hospital encounter of 01/25/16 (from the past 48 hour(s))  Glucose, capillary     Status: Abnormal   Collection Time: 01/25/16 10:56 PM  Result Value Ref Range   Glucose-Capillary 167 (H) 65 - 99 mg/dL   Comment 1 Notify RN    Comment 2 Document in Chart   CBC     Status: Abnormal   Collection Time: 01/26/16  4:42 AM  Result Value Ref Range   WBC 10.3 4.0 - 10.5 K/uL   RBC 4.48 4.22 - 5.81 MIL/uL   Hemoglobin 12.6 (L) 13.0 - 17.0 g/dL   HCT 40.0 39.0 - 52.0 %   MCV 89.3 78.0 - 100.0 fL   MCH 28.1 26.0 - 34.0 pg   MCHC 31.5 30.0 - 36.0 g/dL   RDW 15.5 11.5 - 15.5 %   Platelets 226 150 - 400 K/uL  Basic metabolic panel     Status: Abnormal   Collection Time: 01/26/16  4:42 AM  Result Value Ref Range   Sodium 138 135 - 145 mmol/L   Potassium 3.6 3.5 - 5.1 mmol/L   Chloride 102 101 - 111 mmol/L   CO2 26 22 - 32 mmol/L   Glucose, Bld 173 (H) 65 - 99  mg/dL   BUN 10 6 - 20 mg/dL   Creatinine, Ser 0.75 0.61 - 1.24 mg/dL   Calcium 9.3 8.9 - 10.3 mg/dL   GFR calc non Af Amer >60 >60 mL/min   GFR calc Af Amer >60 >60 mL/min    Comment: (NOTE) The eGFR has been calculated using the CKD EPI equation. This calculation has not been validated in all clinical situations. eGFR's persistently <60 mL/min signify possible Chronic Kidney Disease.    Anion gap 10 5 - 15  Lipid panel     Status: Abnormal   Collection Time: 01/26/16  4:42 AM  Result Value Ref Range   Cholesterol 120 0 - 200 mg/dL   Triglycerides 148 <150 mg/dL   HDL 26 (L) >40 mg/dL   Total CHOL/HDL Ratio 4.6 RATIO   VLDL 30 0 - 40 mg/dL   LDL Cholesterol 64 0 - 99 mg/dL    Comment:        Total Cholesterol/HDL:CHD Risk Coronary Heart Disease Risk Table                     Men   Women  1/2 Average Risk   3.4   3.3  Average Risk       5.0   4.4  2 X Average Risk   9.6   7.1  3 X Average Risk  23.4   11.0  Use the calculated Patient Ratio above and the CHD Risk Table to determine the patient's CHD Risk.        ATP III CLASSIFICATION (LDL):  <100     mg/dL   Optimal  100-129  mg/dL   Near or Above                    Optimal  130-159  mg/dL   Borderline  160-189  mg/dL   High  >190     mg/dL   Very High   Glucose, capillary     Status: Abnormal   Collection Time: 01/26/16  6:27 AM  Result Value Ref Range   Glucose-Capillary 163 (H) 65 - 99 mg/dL   Comment 1 Notify RN    Comment 2 Document in Chart   Glucose, capillary     Status: Abnormal   Collection Time: 01/26/16 11:52 AM  Result Value Ref Range   Glucose-Capillary 174 (H) 65 - 99 mg/dL  Glucose, capillary     Status: Abnormal   Collection Time: 01/26/16  4:28 PM  Result Value Ref Range   Glucose-Capillary 183 (H) 65 - 99 mg/dL  Glucose, capillary     Status: Abnormal   Collection Time: 01/26/16  9:50 PM  Result Value Ref Range   Glucose-Capillary 194 (H) 65 - 99 mg/dL  Glucose, capillary      Status: Abnormal   Collection Time: 01/27/16  6:18 AM  Result Value Ref Range   Glucose-Capillary 159 (H) 65 - 99 mg/dL   Comment 1 Notify RN    Comment 2 Document in Chart     Mr Brain Wo Contrast  Result Date: 01/26/2016 CLINICAL DATA:  Acute onset LEFT lower extremity weakness and sensory deficits. 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 History of hypertension, stroke, diabetes. EXAM: MRI HEAD WITHOUT CONTRAST MRA HEAD WITHOUT CONTRAST TECHNIQUE: Multiplanar, multiecho pulse sequences of the brain and surrounding structures were obtained without intravenous contrast. Angiographic images of the head were obtained using MRA technique without contrast. COMPARISON:  None. FINDINGS: MRI HEAD FINDINGS INTRACRANIAL CONTENTS: No reduced diffusion to suggest acute ischemia. Scattered small foci of susceptibility artifact scattered throughout infra and supratentorial brain including deep gray nuclei. Susceptibility artifact associated with old RIGHT basal ganglia infarct. Ventricles and sulci are normal for patient's age. Patchy supratentorial and pontine white matter FLAIR T2 hyperintensities. No midline shift, mass effect or mass lesions. No abnormal extra-axial fluid collections. Minimal susceptibility artifact within posterior fossa extra-axial space, suggesting old subarachnoid hemorrhage or possibly from prior spine surgery. ORBITS: The included ocular globes and orbital contents are non-suspicious. Status post bilateral ocular lens implants. SINUSES: Small RIGHT mucosal retention cyst. Mastoid air cells are well aerated. SKULL/SOFT TISSUES: No abnormal sellar expansion. No suspicious calvarial bone marrow signal. Craniocervical junction maintained. Patient is edentulous. MRA HEAD FINDINGS: ANTERIOR CIRCULATION: Tortuous included cervical internal carotid arteries, associated with chronic hypertension. Flow related enhancement of the bilateral internal carotid arteries  through the level of the carotid terminus. Symmetric flow related enhancement of the ophthalmic arteries. RIGHT knee A2 segment origin infundibulum without discrete aneurysm. Patent bilateral anterior cerebral arteries. Flow related enhancement of bilateral middle cerebral arteries. No large vessel occlusion, high-grade stenosis, abnormal luminal irregularity, aneurysm. Mild dolichoectasia. POSTERIOR CIRCULATION: LEFT vertebral artery is dominant. Basilar artery is patent, with normal flow related enhancement of the main branch vessels. Mild stenosis RIGHT P1 segment. Flow related enhancement of bilateral posterior cerebral arteries. No large vessel  occlusion, high-grade stenosis, abnormal luminal irregularity, aneurysm. Mild dolichoectasia. IMPRESSION: MRI HEAD: No acute intracranial process, no acute ischemia. 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 Scattered susceptibility artifact compatible chronic hypertension. Involutional changes and moderate chronic small vessel ischemic disease. MRA HEAD: No emergent large vessel occlusion or severe stenosis. Dolichoectasia associated with chronic hypertension. Electronically Signed   By: Elon Alas M.Chandler.   On: 01/26/2016 03:21   Mr Cervical Spine Wo Contrast  Result Date: 01/26/2016 CLINICAL DATA:  Left leg weakness and foot drop. History of benign tumor lumbar spine. History of stroke. EXAM: MRI CERVICAL, THORACIC AND LUMBAR SPINE WITHOUT CONTRAST TECHNIQUE: Multiplanar and multiecho pulse sequences of the cervical spine, to include the craniocervical junction and cervicothoracic junction, and thoracic and lumbar spine, were obtained without intravenous contrast. COMPARISON:  Lumbar MRI 12/16/2012 FINDINGS: MRI CERVICAL SPINE FINDINGS Alignment: Mild retrolisthesis C3-4, C5-6. 3 mm anterior listhesis C7-T1. Mild retrolisthesis T1-2. Vertebrae: Image quality degraded by motion. Allowing for this, no fracture or mass lesion identified Cord: Spinal cord  signal normal. Posterior Fossa, vertebral arteries, paraspinal tissues: Negative Disc levels: C2-3:  Negative C3-4: Mild retrolisthesis. Moderate spondylosis causing mild spinal stenosis and moderate foraminal narrowing right greater than left. C4-5: Disc degeneration and spondylosis. Foraminal narrowing bilaterally due to spurring C5-6: Large left-sided osteophyte with cord flattening on the left and moderate stenosis on the left. Moderate right foraminal narrowing and mild left foraminal narrowing. C6-7: Disc degeneration and spondylosis. Diffuse uncinate spurring with mild foraminal narrowing bilaterally and mild spinal stenosis C7-T1: 3 mm anterior slip due to facet degeneration. Moderate foraminal stenosis bilaterally. MRI THORACIC SPINE FINDINGS Alignment: 2 mm retrolisthesis of T1 on T2 with disc space narrowing and endplate changes compatible with advanced disc degeneration. Remaining alignment normal. Vertebrae: Negative for fracture.  Negative for mass lesion. Cord:  Spinal cord signal is normal.  No cord lesion. Paraspinal and other soft tissues: Paraspinous soft tissues normal. No pleural effusion. Disc levels: T1-2: Retrolisthesis with disc degeneration. No significant spinal stenosis. T2-3:  Negative T3-4: Negative T4-5:  Negative T5-6:  Mild disc degeneration T6-7:  Mild disc degeneration T7-8: Advanced disc degeneration with disc space narrowing and spurring. Bilateral facet degeneration. Mild to moderate spinal stenosis. Mild foraminal narrowing bilaterally T8-9:  Mild disc and facet degeneration T9-10:  Mild disc and facet degeneration T10-11: Moderate to advanced disc degeneration and spondylosis. Bilateral facet degeneration. Moderate spinal stenosis with mild compression of the cord. No cord signal abnormality. Marked foraminal narrowing on the right and moderate foraminal narrowing on the left. T11-12: Mild retrolisthesis with disc and facet degeneration. Mild spinal stenosis. T12-L1: Disc  degeneration and facet degeneration with mild spinal stenosis and left foraminal stenosis. MRI LUMBAR SPINE FINDINGS Image quality degraded by moderate motion. Segmentation:  Normal segmentation Alignment:  Mild retrolisthesis L3-4.  Anterior listhesis L4-5 Vertebrae: Pedicle screw and interbody fusion L4-5 and L5-S1. Negative for fracture or mass lesion. Conus medullaris: Extends to the L1-2 level and appears normal. Paraspinal and other soft tissues: No paraspinous mass or fluid collection. Posterior fluid collection at the laminectomy bed has improved since the prior study now measuring 12 x 20 mm. Disc levels: L1-2: Moderate disc and facet degeneration with moderate spinal stenosis. Diffuse disc bulging and spurring with moderate spinal stenosis and moderate foraminal stenosis bilaterally L2-3:  Mild disc degeneration with mild spinal stenosis L3-4: Extensive endplate osteophyte formation. There is severe spinal stenosis. Canal not well evaluated on axial images due to artifact from hardware. There is marked foraminal  encroachment bilaterally due to spurring L4-5: Pedicle screw and interbody fusion. Extensive artifact is present. No definite spinal stenosis. 6 mm anterior listhesis L4-5 L5-S1: Pedicle screw and interbody fusion. Extensive artifact. No definite stenosis. IMPRESSION: Cervical spondylosis at multiple levels. Largest osteophyte on the left at C5-6 with cord flattening and moderate spinal stenosis. Multilevel thoracic degenerative change. Moderate spinal stenosis at T10-11 with marked foraminal narrowing on the right and moderate foraminal narrowing on the left. Mild spinal stenosis at T11-12 and T12-L1 Lumbar fusion L4-5 and L5-S1 Severe spinal stenosis at L3-4 with severe foraminal encroachment bilaterally due to diffuse endplate osteophyte formation. Moderate spinal stenosis L1-2. Moderate foraminal stenosis bilaterally at L1-2. Electronically Signed   By: Franchot Gallo M.Chandler.   On: 01/26/2016  16:46   Mr Thoracic Spine Wo Contrast  Result Date: 01/26/2016 CLINICAL DATA:  Left leg weakness and foot drop. History of benign tumor lumbar spine. History of stroke. EXAM: MRI CERVICAL, THORACIC AND LUMBAR SPINE WITHOUT CONTRAST TECHNIQUE: Multiplanar and multiecho pulse sequences of the cervical spine, to include the craniocervical junction and cervicothoracic junction, and thoracic and lumbar spine, were obtained without intravenous contrast. COMPARISON:  Lumbar MRI 12/16/2012 FINDINGS: MRI CERVICAL SPINE FINDINGS Alignment: Mild retrolisthesis C3-4, C5-6. 3 mm anterior listhesis C7-T1. Mild retrolisthesis T1-2. Vertebrae: Image quality degraded by motion. Allowing for this, no fracture or mass lesion identified Cord: Spinal cord signal normal. Posterior Fossa, vertebral arteries, paraspinal tissues: Negative Disc levels: C2-3:  Negative C3-4: Mild retrolisthesis. Moderate spondylosis causing mild spinal stenosis and moderate foraminal narrowing right greater than left. C4-5: Disc degeneration and spondylosis. Foraminal narrowing bilaterally due to spurring C5-6: Large left-sided osteophyte with cord flattening on the left and moderate stenosis on the left. Moderate right foraminal narrowing and mild left foraminal narrowing. C6-7: Disc degeneration and spondylosis. Diffuse uncinate spurring with mild foraminal narrowing bilaterally and mild spinal stenosis C7-T1: 3 mm anterior slip due to facet degeneration. Moderate foraminal stenosis bilaterally. MRI THORACIC SPINE FINDINGS Alignment: 2 mm retrolisthesis of T1 on T2 with disc space narrowing and endplate changes compatible with advanced disc degeneration. Remaining alignment normal. Vertebrae: Negative for fracture.  Negative for mass lesion. Cord:  Spinal cord signal is normal.  No cord lesion. Paraspinal and other soft tissues: Paraspinous soft tissues normal. No pleural effusion. Disc levels: T1-2: Retrolisthesis with disc degeneration. No significant  spinal stenosis. T2-3:  Negative T3-4: Negative T4-5:  Negative T5-6:  Mild disc degeneration T6-7:  Mild disc degeneration T7-8: Advanced disc degeneration with disc space narrowing and spurring. Bilateral facet degeneration. Mild to moderate spinal stenosis. Mild foraminal narrowing bilaterally T8-9:  Mild disc and facet degeneration T9-10:  Mild disc and facet degeneration T10-11: Moderate to advanced disc degeneration and spondylosis. Bilateral facet degeneration. Moderate spinal stenosis with mild compression of the cord. No cord signal abnormality. Marked foraminal narrowing on the right and moderate foraminal narrowing on the left. T11-12: Mild retrolisthesis with disc and facet degeneration. Mild spinal stenosis. T12-L1: Disc degeneration and facet degeneration with mild spinal stenosis and left foraminal stenosis. MRI LUMBAR SPINE FINDINGS Image quality degraded by moderate motion. Segmentation:  Normal segmentation Alignment:  Mild retrolisthesis L3-4.  Anterior listhesis L4-5 Vertebrae: Pedicle screw and interbody fusion L4-5 and L5-S1. Negative for fracture or mass lesion. Conus medullaris: Extends to the L1-2 level and appears normal. Paraspinal and other soft tissues: No paraspinous mass or fluid collection. Posterior fluid collection at the laminectomy bed has improved since the prior study now measuring 12 x 20 mm.  Disc levels: L1-2: Moderate disc and facet degeneration with moderate spinal stenosis. Diffuse disc bulging and spurring with moderate spinal stenosis and moderate foraminal stenosis bilaterally L2-3:  Mild disc degeneration with mild spinal stenosis L3-4: Extensive endplate osteophyte formation. There is severe spinal stenosis. Canal not well evaluated on axial images due to artifact from hardware. There is marked foraminal encroachment bilaterally due to spurring L4-5: Pedicle screw and interbody fusion. Extensive artifact is present. No definite spinal stenosis. 6 mm anterior listhesis  L4-5 L5-S1: Pedicle screw and interbody fusion. Extensive artifact. No definite stenosis. IMPRESSION: Cervical spondylosis at multiple levels. Largest osteophyte on the left at C5-6 with cord flattening and moderate spinal stenosis. Multilevel thoracic degenerative change. Moderate spinal stenosis at T10-11 with marked foraminal narrowing on the right and moderate foraminal narrowing on the left. Mild spinal stenosis at T11-12 and T12-L1 Lumbar fusion L4-5 and L5-S1 Severe spinal stenosis at L3-4 with severe foraminal encroachment bilaterally due to diffuse endplate osteophyte formation. Moderate spinal stenosis L1-2. Moderate foraminal stenosis bilaterally at L1-2. Electronically Signed   By: Franchot Gallo M.Chandler.   On: 01/26/2016 16:46   Mr Lumbar Spine Wo Contrast  Result Date: 01/26/2016 CLINICAL DATA:  Left leg weakness and foot drop. History of benign tumor lumbar spine. History of stroke. EXAM: MRI CERVICAL, THORACIC AND LUMBAR SPINE WITHOUT CONTRAST TECHNIQUE: Multiplanar and multiecho pulse sequences of the cervical spine, to include the craniocervical junction and cervicothoracic junction, and thoracic and lumbar spine, were obtained without intravenous contrast. COMPARISON:  Lumbar MRI 12/16/2012 FINDINGS: MRI CERVICAL SPINE FINDINGS Alignment: Mild retrolisthesis C3-4, C5-6. 3 mm anterior listhesis C7-T1. Mild retrolisthesis T1-2. Vertebrae: Image quality degraded by motion. Allowing for this, no fracture or mass lesion identified Cord: Spinal cord signal normal. Posterior Fossa, vertebral arteries, paraspinal tissues: Negative Disc levels: C2-3:  Negative C3-4: Mild retrolisthesis. Moderate spondylosis causing mild spinal stenosis and moderate foraminal narrowing right greater than left. C4-5: Disc degeneration and spondylosis. Foraminal narrowing bilaterally due to spurring C5-6: Large left-sided osteophyte with cord flattening on the left and moderate stenosis on the left. Moderate right foraminal  narrowing and mild left foraminal narrowing. C6-7: Disc degeneration and spondylosis. Diffuse uncinate spurring with mild foraminal narrowing bilaterally and mild spinal stenosis C7-T1: 3 mm anterior slip due to facet degeneration. Moderate foraminal stenosis bilaterally. MRI THORACIC SPINE FINDINGS Alignment: 2 mm retrolisthesis of T1 on T2 with disc space narrowing and endplate changes compatible with advanced disc degeneration. Remaining alignment normal. Vertebrae: Negative for fracture.  Negative for mass lesion. Cord:  Spinal cord signal is normal.  No cord lesion. Paraspinal and other soft tissues: Paraspinous soft tissues normal. No pleural effusion. Disc levels: T1-2: Retrolisthesis with disc degeneration. No significant spinal stenosis. T2-3:  Negative T3-4: Negative T4-5:  Negative T5-6:  Mild disc degeneration T6-7:  Mild disc degeneration T7-8: Advanced disc degeneration with disc space narrowing and spurring. Bilateral facet degeneration. Mild to moderate spinal stenosis. Mild foraminal narrowing bilaterally T8-9:  Mild disc and facet degeneration T9-10:  Mild disc and facet degeneration T10-11: Moderate to advanced disc degeneration and spondylosis. Bilateral facet degeneration. Moderate spinal stenosis with mild compression of the cord. No cord signal abnormality. Marked foraminal narrowing on the right and moderate foraminal narrowing on the left. T11-12: Mild retrolisthesis with disc and facet degeneration. Mild spinal stenosis. T12-L1: Disc degeneration and facet degeneration with mild spinal stenosis and left foraminal stenosis. MRI LUMBAR SPINE FINDINGS Image quality degraded by moderate motion. Segmentation:  Normal segmentation Alignment:  Mild  retrolisthesis L3-4.  Anterior listhesis L4-5 Vertebrae: Pedicle screw and interbody fusion L4-5 and L5-S1. Negative for fracture or mass lesion. Conus medullaris: Extends to the L1-2 level and appears normal. Paraspinal and other soft tissues: No  paraspinous mass or fluid collection. Posterior fluid collection at the laminectomy bed has improved since the prior study now measuring 12 x 20 mm. Disc levels: L1-2: Moderate disc and facet degeneration with moderate spinal stenosis. Diffuse disc bulging and spurring with moderate spinal stenosis and moderate foraminal stenosis bilaterally L2-3:  Mild disc degeneration with mild spinal stenosis L3-4: Extensive endplate osteophyte formation. There is severe spinal stenosis. Canal not well evaluated on axial images due to artifact from hardware. There is marked foraminal encroachment bilaterally due to spurring L4-5: Pedicle screw and interbody fusion. Extensive artifact is present. No definite spinal stenosis. 6 mm anterior listhesis L4-5 L5-S1: Pedicle screw and interbody fusion. Extensive artifact. No definite stenosis. IMPRESSION: Cervical spondylosis at multiple levels. Largest osteophyte on the left at C5-6 with cord flattening and moderate spinal stenosis. Multilevel thoracic degenerative change. Moderate spinal stenosis at T10-11 with marked foraminal narrowing on the right and moderate foraminal narrowing on the left. Mild spinal stenosis at T11-12 and T12-L1 Lumbar fusion L4-5 and L5-S1 Severe spinal stenosis at L3-4 with severe foraminal encroachment bilaterally due to diffuse endplate osteophyte formation. Moderate spinal stenosis L1-2. Moderate foraminal stenosis bilaterally at L1-2. Electronically Signed   By: Franchot Gallo M.Chandler.   On: 01/26/2016 16:46   [name] Head/brain FK Cm  Result Date: 01/26/2016 CLINICAL DATA:  Acute onset LEFT lower extremity weakness and sensory deficits. 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 History of hypertension, stroke, diabetes. EXAM: MRI HEAD WITHOUT CONTRAST MRA HEAD WITHOUT CONTRAST TECHNIQUE: Multiplanar, multiecho pulse sequences of the brain and surrounding structures were obtained without intravenous contrast.  Angiographic images of the head were obtained using MRA technique without contrast. COMPARISON:  None. FINDINGS: MRI HEAD FINDINGS INTRACRANIAL CONTENTS: No reduced diffusion to suggest acute ischemia. Scattered small foci of susceptibility artifact scattered throughout infra and supratentorial brain including deep gray nuclei. Susceptibility artifact associated with old RIGHT basal ganglia infarct. Ventricles and sulci are normal for patient's age. Patchy supratentorial and pontine white matter FLAIR T2 hyperintensities. No midline shift, mass effect or mass lesions. No abnormal extra-axial fluid collections. Minimal susceptibility artifact within posterior fossa extra-axial space, suggesting old subarachnoid hemorrhage or possibly from prior spine surgery. ORBITS: The included ocular globes and orbital contents are non-suspicious. Status post bilateral ocular lens implants. SINUSES: Small RIGHT mucosal retention cyst. Mastoid air cells are well aerated. SKULL/SOFT TISSUES: No abnormal sellar expansion. No suspicious calvarial bone marrow signal. Craniocervical junction maintained. Patient is edentulous. MRA HEAD FINDINGS: ANTERIOR CIRCULATION: Tortuous included cervical internal carotid arteries, associated with chronic hypertension. Flow related enhancement of the bilateral internal carotid arteries through the level of the carotid terminus. Symmetric flow related enhancement of the ophthalmic arteries. RIGHT knee A2 segment origin infundibulum without discrete aneurysm. Patent bilateral anterior cerebral arteries. Flow related enhancement of bilateral middle cerebral arteries. No large vessel occlusion, high-grade stenosis, abnormal luminal irregularity, aneurysm. Mild dolichoectasia. POSTERIOR CIRCULATION: LEFT vertebral artery is dominant. Basilar artery is patent, with normal flow related enhancement of the main branch vessels. Mild stenosis RIGHT P1 segment. Flow related enhancement of bilateral posterior  cerebral arteries. No large vessel occlusion, high-grade stenosis, abnormal luminal irregularity, aneurysm. Mild dolichoectasia. IMPRESSION: MRI HEAD: No acute intracranial process, no acute ischemia. Old RIGHT basal ganglia hemorrhagic  infarct. Scattered susceptibility artifact compatible chronic hypertension. Involutional changes and moderate chronic small vessel ischemic disease. MRA HEAD: No emergent large vessel occlusion or severe stenosis. Dolichoectasia associated with chronic hypertension. Electronically Signed   By: Elon Alas M.Chandler.   On: 01/26/2016 03:21    ROS: As above Blood pressure 137/63, pulse 61, temperature 97.9 F (36.6 C), temperature source Oral, resp. rate 20, height '5\' 7"'  (1.702 m), weight 122.2 kg (269 lb 6.4 oz), SpO2 99 %. Physical Exam  General: An alert and pleasant 82 year old white male in no apparent distress sitting at the bedside eating breakfast  Back exam: The patient's lumbar incision is well-healed. There is no point tenderness.   Exam: The patient is alert and oriented 3. His strength is grossly normal in all 4 extremities except he has a chronic left foot drop. I.e. his strength in his left psoas, quadriceps, and gastrocnemius is grossly normal to hand confrontation. Sensation is normal in his lower extremities except in the L5 distribution which again is chronic since 1991.  I have reviewed the patient's cervical, thoracic, and lumbar MRIs performed at [facility] yesterday. The patient has had an L4-5 and L5-S1 decompression and fusion. He has moderate adjacent segment stenosis at L3-4 with significant metal artifact. His thoracic MRI demonstrates moderate stenosis/spondylosis, herniated disc at T10-11. His cervical MRI demonstrates left-sided spondylosis and ventral spinal cord compression at C5-6 with diffuse age-appropriate degenerative changes  Assessment/Plan: Cervical, thoracic, and lumbar stenosis: The patient does not seem  particularly weak to hand confrontation. I would suggest having physical therapy mobilize him. He is not having any neck or upper extremity symptoms so I doubt the cervical spondylosis is the problem. He has some moderate adjacent segmentat L3-4 but his symptoms don't seem to click consistent with neurogenic claudication from lumbar stenosis without radicular pain and numbness. I don't see any evidence of stroke on his brain MRI. I think the most likely culprit is his thoracic stenosis. If he does not improve he may need a thoracic laminectomy. Dr. Saintclair Chandler will likely see the patient tomorrow.  Phillip Chandler 01/27/2016, 8:06 AM

## 2016-01-27 NOTE — Care Management Note (Addendum)
Case Management Note  Patient Details  Name: Phillip Chandler MRN: [id number] Date of Birth: 06/06/1930  Subjective/Objective:                    Action/Plan: Pt discharging home with orders for [facility] services. CM met with the patient and provided him a list of [place] agencies in the [facility] area. He selected [place] [facility] [place] spoke to [place] and they accepted the referral. Information requested faxed to the number provided. Bedside RN updated.   Expected Discharge Date:                  Expected Discharge Plan:  [place]  In-House Referral:     Discharge planning Services  CM Consult  Post Acute Care Choice:    Choice offered to:  Patient  DME Arranged:    DME Agency:     HH Arranged:  RN, PT, OT [facility] Agency:  [place]  Status of Service:  Completed, signed off  If discussed at [employer] of Stay Meetings, dates discussed:    Additional Comments:  Pollie Friar, RN 01/27/2016, 4:08 PM

## 2016-01-27 NOTE — Progress Notes (Signed)
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 MRI on 8/27 negative for acute infarct. 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     PT Comments    Pt progressing towards all goals. Pt with noted further L sided weakness and impaired co-ordination but was able to tolerate amb of 120' with RW. COn't to recommend HHPT upon d/c to optimize ambulation pattern.  Follow Up Recommendations  Home health PT;Supervision for mobility/OOB     Equipment Recommendations  None recommended by PT    Recommendations for Other Services       Precautions / Restrictions Precautions Precautions: Fall Required Braces or Orthoses: Other Brace/Splint Other Brace/Splint: bil AFOs Restrictions Weight Bearing Restrictions: No    Mobility  Bed Mobility               General bed mobility comments: pt up in chair  Transfers Overall transfer level: Needs assistance Equipment used: Rolling walker (2 wheeled) Transfers: Sit to/from Stand Sit to Stand: Min assist         General transfer comment: v/c's for safe hand placement  Ambulation/Gait Ambulation/Gait assistance: Min assist (2nd person for chair follow) Ambulation Distance (Feet): 120 Feet Assistive device: Rolling walker (2 wheeled) Gait Pattern/deviations: Step-through pattern Gait velocity: decreased Gait velocity interpretation: Below normal speed for age/gender General Gait Details: pt unable to advance L LE with toes forward due to buckling of knee. pt with L LE  externally rotated. Pt's L heel hits R heel durign advancement.worked on increasing L hip flexion and kicking L LE out forwrd to prevent hitting other foot.    Stairs            Wheelchair Mobility    Modified Rankin (Stroke Patients Only) Modified Rankin (Stroke Patients Only) Pre-Morbid Rankin Score: Moderate disability Modified Rankin: Moderately severe disability     Balance Overall balance assessment: Needs assistance Sitting-balance support: Feet supported;No upper extremity supported Sitting balance-Leahy Scale: Good Sitting balance - Comments: pt attempted to don socks but unable, did assist PT with donning of bilat AFOs   Standing balance support: Bilateral upper extremity supported Standing balance-Leahy Scale: Poor Standing balance comment: RW for support                    Cognition Arousal/Alertness: Awake/alert Behavior During Therapy: WFL for tasks assessed/performed Overall Cognitive Status: Within Functional Limits for tasks assessed                      Exercises      General Comments        Pertinent Vitals/Pain Pain Assessment: No/denies pain    Home Living                      Prior Function            PT Goals (current goals can now be found in the care plan section) Acute Rehab PT Goals Patient Stated Goal: return home Progress towards PT goals: Progressing toward goals    Frequency  Min 4X/week  PT Plan Current plan remains appropriate    Co-evaluation             End of Session Equipment Utilized During Treatment: Gait belt Activity Tolerance: Patient tolerated treatment well Patient left:  (left on BSC in bathroom with OT)     Time: [id number] PT Time Calculation (min) (ACUTE ONLY): 17 min  Charges:  $Gait Training: 8-22 mins                    G Codes:      Kingsley Callander 01/27/2016, 11:54 AM   Kittie Plater, PT, DPT Pager #: [phone] Office #: [phone]

## 2016-01-27 NOTE — Clinical Social Work Note (Signed)
CSW consulted for New SNF. CSW reviewed chart and discussed pt with RN, Agricultural consultant, and RNCM during progression. PT is recommending HHPT. CSW is signing off as no further needs identified.   Darden Dates, MSW, LCSW  Clinical Social Worker [phone]

## 2016-01-27 NOTE — Progress Notes (Signed)
STROKE TEAM PROGRESS NOTE   SUBJECTIVE (INTERVAL HISTORY) No family present in the room. Patient was sitting up in the chair. He would like to see his x-rays. Dr. Erlinda Hong shared them with him.   OBJECTIVE Temp:  [97.7 F (36.5 C)-98 F (36.7 C)] 97.7 F (36.5 C) (08/28 1353) Pulse Rate:  [61-82] 62 (08/28 1353) Cardiac Rhythm: Atrial fibrillation (08/28 0725) Resp:  [16-20] 16 (08/28 1353) BP: (103-181)/(55-82) 103/55 (08/28 1353) SpO2:  [94 %-99 %] 94 % (08/28 1353)  CBC:   Recent Labs Lab 01/26/16 0442  WBC 10.3  HGB 12.6*  HCT 40.0  MCV 89.3  PLT [id number]    Basic Metabolic Panel:   Recent Labs Lab 01/26/16 0442  NA 138  K 3.6  CL 102  CO2 26  GLUCOSE 173*  BUN 10  CREATININE 0.75  CALCIUM 9.3    Lipid Panel:     Component Value Date/Time   CHOL 120 01/26/2016 0442   TRIG 148 01/26/2016 0442   HDL 26 (L) 01/26/2016 0442   CHOLHDL 4.6 01/26/2016 0442   VLDL 30 01/26/2016 0442   LDLCALC 64 01/26/2016 0442   HgbA1c:  Lab Results  Component Value Date   HGBA1C 8.4 (H) 01/26/2016   Urine Drug Screen: No results found for: LABOPIA, COCAINSCRNUR, LABBENZ, AMPHETMU, THCU, LABBARB    IMAGING  MRI HEAD:  01/26/2016 No acute intracranial process, no acute ischemia. 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 Scattered susceptibility artifact compatible chronic hypertension. Involutional changes and moderate chronic small vessel ischemic disease.   MRA HEAD:  01/26/2016 No emergent large vessel occlusion or severe stenosis. Dolichoectasia associated with chronic hypertension.   Mr Cervical Spine Wo Contrast Mr Thoracic Spine Wo Contrast Mr Lumbar Spine Wo Contrast 01/26/2016  Cervical spondylosis at multiple levels. Largest osteophyte on the left at C5-6 with cord flattening and moderate spinal stenosis. Multilevel thoracic degenerative change. Moderate spinal stenosis at T10-11 with marked foraminal narrowing on the right and moderate foraminal narrowing on the  left. Mild spinal stenosis at T11-12 and T12-L1 Lumbar fusion L4-5 and L5-S1 Severe spinal stenosis at L3-4 with severe foraminal encroachment bilaterally due to diffuse endplate osteophyte formation. Moderate spinal stenosis L1-2. Moderate foraminal stenosis bilaterally at L1-2.    TTE - canceled  CUS Bilateral - 1% to 39% ICA stenosis. Right vertebral artery and bilateral subclavian not visualized due to respiratory interference    PHYSICAL EXAM  Temp:  [97.7 F (36.5 C)-98 F (36.7 C)] 97.7 F (36.5 C) (08/28 1353) Pulse Rate:  [61-82] 62 (08/28 1353) Resp:  [16-20] 16 (08/28 1353) BP: (103-181)/(55-82) 103/55 (08/28 1353) SpO2:  [94 %-99 %] 94 % (08/28 1353)  General - Well nourished, well developed, in no apparent distress.  Ophthalmologic - Fundi not visualized due to eye movement.  Cardiovascular - Regular rate and rhythm.  Mental Status -  Level of arousal and orientation to time, place, and person were intact. Language including expression, naming, repetition, comprehension was assessed and found intact. Fund of Knowledge was assessed and was intact.  Cranial Nerves II - XII - II - Visual field intact OU. III, IV, VI - Extraocular movements intact. V - Facial sensation intact bilaterally. VII - Facial movement intact bilaterally. VIII - Hearing & vestibular intact bilaterally. X - Palate elevates symmetrically. XI - Chin turning & shoulder shrug intact bilaterally. XII - Tongue protrusion intact.  Motor Strength - The patient's strength was 5/5 LUE deltoid, bicep and tricep, 4+/5 finger grip and dexterity. LLE  4/5 iliopsoas, 5-/5 knee extension and 0/5 DF and RLE 5/5 and pronator drift was absent.  Bulk was normal and fasciculations were absent.   Motor Tone - Muscle tone was assessed at the neck and appendages and was normal.  Reflexes - The patient's reflexes were 1+ in all extremities and he had no pathological reflexes.  Sensory - Light touch,  temperature/pinprick were assessed and were symmetrical.    Coordination - The patient had normal movements in the hands with no ataxia or dysmetria.  Tremor was absent.  Gait and Station - standing with two assist, able to lift up LLE but felt weak not able to control, did not attempt for walking.   ASSESSMENT/PLAN [name] is a 82 y.o. male with history of atrial fibrillation on Xarelto, diabetes mellitus, known left foot drop from lumbar radiculopathy, hyperlipidemia, congestive heart failure, and anxiety presenting with left lower extremity weakness. He did not receive IV t-PA due to anticoagulation on Xarelto.  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  MRA brain - No emergent large vessel occlusion or severe stenosis  MRI C/T/L spine Severe spinal stenosis at L3-4 with severe foraminal encroachment bilaterally. Multilevel thoracic degenerative change with moderate spinal stenosis T10-11, with marketed foraminal narrowing. Cervical spondylosis C5-6 with cord flattening  Carotid Doppler - No significant stenosis   2D Echo canceled  LDL - 64  HgbA1c 8.4  VTE prophylaxis - Xarelto Diet heart healthy/carb modified Room service appropriate? Yes; Fluid consistency: Thin Diet Carb Modified  Xarelto (rivaroxaban) daily prior to admission, now on Xarelto (rivaroxaban) daily  Patient counseled to be compliant with his antithrombotic medications  Ongoing aggressive stroke risk factor management  Therapy recommendations: HH PT and OT  Disposition: pending  Afib  Chronic  On Xarelto - stated compliance  Continue Xarelto  Rate controlled  Hypertension  Blood pressure high on admission - on labetalol, clonidine, Lasix, Cozaar  Stated good BP control at home  Long-term BP goal normotensive  Hyperlipidemia  Home meds:  Zocor 40 mg daily resumed  in hospital  LDL 64, goal < 70  Continue statin at discharge.  Diabetes  HgbA1c 8.4, goal < 7.0  CBG monitoring  Other Stroke Risk Factors  Advanced age  The patient quit smoking 8 years ago.  Morbid Obesity, Body mass index is 42.19 kg/m., recommend weight loss, diet and exercise as appropriate   hx stroke/TIA in 2005 - old right basal ganglia hemorrhagic infarct on MRI  Diastolic CHF  Other Active Problems  Hx of lumbar spine surgery in 2014 with Dr. Saintclair Halsted  Hx of sacral coccygeal spine surgery to remove benign tumor in 2005 - residue foot drop  Long standing LUE mild weakness, mainly left hand  Hospital day # 2  Rosalin Hawking, MD PhD Stroke Neurology 01/27/2016 3:36 PM   To contact Stroke Continuity provider, please refer to [email or web address] After hours, contact General Neurology

## 2016-01-27 NOTE — Discharge Summary (Addendum)
Physician Discharge Summary  MD. HOOS [id number] DOB: 01/22/1931 DOA: 01/25/2016  PCP: Houston Siren, MD  Admit date: 01/25/2016 Discharge date: 01/27/2016  Admitted From: home  Disposition:  home  Recommendations for Outpatient Follow-up:  1. Follow up with Dr. Saintclair Halsted, Neurosurgery, as soon as possible 2. 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  He had difficulty controlling his left leg which kept externally rotating and swinging in to hit his right heel when attempting to ambulate.  He presented to the [facility] ER where he was hypertensive 191/109.  CT head was negative for acute change and was transferred to [facility] for suspected acute stroke.  His blood pressures has trended down. MRI brain was negative for stroke.  Neurology suspected a possible spinal cord compression/stenosis and ordered an MRI of the cervical, thoracic, and lumbar spine which demonstrated multilevel spinal stenosis.  He was seen by Neurosurgery who recommended consultation by Dr. Saintclair Halsted, the patient's primary neurosurgeon.  He worked with PT/OT who recommended home health services which have been arranged by case management.  He will be discharged to home with follow up with Dr. Saintclair Halsted on Friday.    Discharge Diagnoses:  Principal Problem:   Weakness of left leg Active Problems:   Diabetes mellitus (HCC)   Stroke [facility])   Atrial fibrillation (HCC)   Diastolic  heart failure (HCC)   Lumbar canal stenosis   Accelerated hypertension   Undiagnosed cardiac murmurs   CVA (cerebral infarction)   Chronic anticoagulation   Spinal stenosis in cervical region   Spinal stenosis, thoracic  Left lower extremity weakness, possibly due to spinal stenosis.  Signs and symptoms initially concerning for recurrent acute CVA, however MRI brain was negative.  TIA is unlikely given the persistence of his symptoms.  Even if he did have a TIA, he has been on chronic anticoagulation and TIA would not change his medical management.  He underwent MRI of the cervical, thoracic, and lumbar spine which demonstrated multilevel stenosis.  The case was discussed between Neurology and Neurosurgery.  He was seen by Dr. Arnoldo Morale who recommended consultation by Dr. Saintclair Halsted.  The case is not typical because spinal cord compression/stenosis is usually painful and [name] has no pain or associated sensory deficits.  PT/OT recommended home health therapy which will be arranged pending the patient's appointment with Dr. Saintclair Halsted on Friday.  Should he require surgery, he is on xarelto and will need a wash-out period prior to surgery.    Accelerated HTN, blood pressure trended down on his home blood pressure medications except for increasing his clonidine from once daily to twice daily.  Follow up with PCP for ongoing BP management.    Chronic a fib, rate controlled. CHADs2vasc = 7 (HTN, Age > 75 2, Diabetes, Stroke2, vasc), recommend anticoagulation.   --Continued Xarelto for now --continued labetalol, Digoxin  Diastolic CHF, appeared euvolemic --Continue home doses of lasix, spironolactone  Diabetes mellitus type 2 with diabetic neuropathy. A1c 8.4 on 01/26/2016.  Continued home insulin regimen and recommend close follow up with PCP for more aggressive diabetes management  Polypharmacy.  Consider discontinuing some medications or changing to PRN if possible.  Defer to PCP.    Discharge  Instructions  Discharge Instructions    (HEART FAILURE PATIENTS) Call MD:  Anytime you have any of the following symptoms: 1) 3 pound weight gain in 24 hours or 5 pounds in 1 week 2) shortness of breath, with or without a dry hacking cough 3) swelling in the hands, feet or stomach 4) if you have to sleep on extra pillows at night in order to breathe.    Complete by:  As directed   Call MD for:  difficulty breathing, headache or visual disturbances    Complete by:  As directed   Call MD for:  extreme fatigue    Complete by:  As directed   Call MD for:  hives    Complete by:  As directed   Call MD for:  persistant dizziness or light-headedness    Complete by:  As directed   Call MD for:  persistant nausea and vomiting    Complete by:  As directed   Call MD for:  severe uncontrolled pain    Complete by:  As directed   Call MD for:  temperature >100.4    Complete by:  As directed   Diet Carb Modified    Complete by:  As directed   Increase activity slowly    Complete by:  As directed       Medication List    TAKE these medications   ammonium lactate 12 % cream Commonly known as:  AMLACTIN Apply topically 3 (three) times daily as needed (spots on arm). Take as directed.   cloNIDine 0.1 MG tablet Commonly known as:  CATAPRES Take 1 tablet (0.1 mg total) by mouth 2 (two) times daily. What changed:  when to take this   digoxin 0.125 MG tablet Commonly known as:  LANOXIN Take 0.125 mg by mouth daily.   fluticasone 50 MCG/ACT nasal spray Commonly known as:  FLONASE Place 2 sprays into both nostrils daily.   furosemide 40 MG tablet Commonly known as:  LASIX Take 40 mg by mouth daily.   HYDROcodone-acetaminophen 5-325 MG tablet Commonly known as:  NORCO/VICODIN Take 1 tablet by mouth every 4 (four) hours as needed (pain).   KLOR-CON M20 20 MEQ tablet Generic drug:  potassium chloride SA Take 20 mEq by mouth 3 (three) times daily.   labetalol 200 MG tablet Commonly known as:   NORMODYNE Take 100 mg by mouth 2 (two) times daily.   LANTUS SOLOSTAR 100 UNIT/ML Solostar Pen Generic drug:  Insulin Glargine Inject 25 Units into the skin daily at 10 pm.   loratadine 10 MG tablet Commonly known as:  CLARITIN Take 10 mg by mouth daily.   losartan 100 MG tablet Commonly known as:  COZAAR Take 100 mg by mouth daily.   mesalamine 1.2 g EC tablet Commonly known as:  LIALDA Take 1.2 g by mouth daily with breakfast.   montelukast 10 MG tablet Commonly known as:  SINGULAIR Take 10 mg by mouth at bedtime.   omega-3 acid ethyl esters 1 g capsule Commonly known as:  LOVAZA Take 1 g by mouth 2 (two) times daily.   pregabalin 100 MG capsule Commonly known as:  LYRICA Take 100 mg by mouth See admin instructions. Take 1 capsule (100 mg) by mouth every morning, noon and 5pm (take  200 mg capsule at bedtime)   pregabalin 200 MG capsule Commonly known as:  LYRICA Take 200 mg by mouth at bedtime.   PRILOSEC 40 MG capsule Generic drug:  omeprazole Take 40 mg by mouth daily.   PROAIR HFA 108 (90 Base) MCG/ACT inhaler Generic drug:  albuterol Inhale 2 puffs into the lungs every 4 (four) hours as needed for wheezing or shortness of breath.   rivaroxaban 20 MG Tabs tablet Commonly known as:  XARELTO Take 20 mg by mouth daily with supper.   simvastatin 40 MG tablet Commonly known as:  ZOCOR Take 20 mg by mouth at bedtime.   sitaGLIPtin-metformin 50-500 MG tablet Commonly known as:  JANUMET Take 1 tablet by mouth 2 (two) times daily with a meal.   spironolactone 25 MG tablet Commonly known as:  ALDACTONE Take 12.5 mg by mouth daily.      Follow-up Information    CRAM,GARY P, MD Follow up on 01/31/2016.   Specialty:  Neurosurgery Why:  9AM for a 9:30AM appointment.  Please bring insurance card. Contact information: [address] [place] [address] [phone]        [facility] C, MD Follow up in 2 week(s).   Specialty:  Family  Medicine Contact information: [address] [place] [address] [phone]          Allergies  Allergen Reactions  . Altace [Ramipril] Shortness Of Breath and Rash  . Bee Venom Swelling    Mouth swelling - treated with benadryl  . Hydromorphone Anaphylaxis  . Fentanyl Other (See Comments)    Personality change- cried a lot (reaction to combination of versed and fentanyl)  . Versed [Midazolam] Other (See Comments)    Change in personality (reaction to combination of fentanyl and versed)  . Canagliflozin-Metformin Hcl Other (See Comments)    dehydration  . Exenatide Other (See Comments)    Leg problems  . Nsaids Other (See Comments)    Pt cannot take with xarelto  . Pneumococcal Vaccine Swelling    Swelling at injection site  . Sulfa Antibiotics Rash    Consultations: Neurology, Dr. Erlinda Hong Neurosurgery, Dr. Arnoldo Morale    Procedures/Studies: Mr Brain Wo Contrast  Result Date: 01/26/2016 CLINICAL DATA:  Acute onset LEFT lower extremity weakness and sensory deficits. 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 History of hypertension, stroke, diabetes. EXAM: MRI HEAD WITHOUT CONTRAST MRA HEAD WITHOUT CONTRAST TECHNIQUE: Multiplanar, multiecho pulse sequences of the brain and surrounding structures were obtained without intravenous contrast. Angiographic images of the head were obtained using MRA technique without contrast. COMPARISON:  None. FINDINGS: MRI HEAD FINDINGS INTRACRANIAL CONTENTS: No reduced diffusion to suggest acute ischemia. Scattered small foci of susceptibility artifact scattered throughout infra and supratentorial brain including deep gray nuclei. Susceptibility artifact associated with old RIGHT basal ganglia infarct. Ventricles and sulci are normal for patient's age. Patchy supratentorial and pontine white matter FLAIR T2 hyperintensities. No midline shift, mass effect or mass lesions. No abnormal extra-axial fluid collections.  Minimal susceptibility artifact within posterior fossa extra-axial space, suggesting old subarachnoid hemorrhage or possibly from prior spine surgery. ORBITS: The included ocular globes and orbital contents are non-suspicious. Status post bilateral ocular lens implants. SINUSES: Small RIGHT mucosal retention cyst. Mastoid air cells are well aerated. SKULL/SOFT TISSUES: No abnormal sellar expansion. No suspicious calvarial bone marrow signal. Craniocervical junction maintained. Patient is edentulous. MRA HEAD FINDINGS: ANTERIOR CIRCULATION: Tortuous included cervical internal carotid arteries, associated with chronic hypertension. Flow related  enhancement of the bilateral internal carotid arteries through the level of the carotid terminus. Symmetric flow related enhancement of the ophthalmic arteries. RIGHT knee A2 segment origin infundibulum without discrete aneurysm. Patent bilateral anterior cerebral arteries. Flow related enhancement of bilateral middle cerebral arteries. No large vessel occlusion, high-grade stenosis, abnormal luminal irregularity, aneurysm. Mild dolichoectasia. POSTERIOR CIRCULATION: LEFT vertebral artery is dominant. Basilar artery is patent, with normal flow related enhancement of the main branch vessels. Mild stenosis RIGHT P1 segment. Flow related enhancement of bilateral posterior cerebral arteries. No large vessel occlusion, high-grade stenosis, abnormal luminal irregularity, aneurysm. Mild dolichoectasia. IMPRESSION: MRI HEAD: No acute intracranial process, no acute ischemia. 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 Scattered susceptibility artifact compatible chronic hypertension. Involutional changes and moderate chronic small vessel ischemic disease. MRA HEAD: No emergent large vessel occlusion or severe stenosis. Dolichoectasia associated with chronic hypertension. Electronically Signed   By: Elon Alas M.D.   On: 01/26/2016 03:21   Mr Cervical Spine Wo  Contrast  Result Date: 01/26/2016 CLINICAL DATA:  Left leg weakness and foot drop. History of benign tumor lumbar spine. History of stroke. EXAM: MRI CERVICAL, THORACIC AND LUMBAR SPINE WITHOUT CONTRAST TECHNIQUE: Multiplanar and multiecho pulse sequences of the cervical spine, to include the craniocervical junction and cervicothoracic junction, and thoracic and lumbar spine, were obtained without intravenous contrast. COMPARISON:  Lumbar MRI 12/16/2012 FINDINGS: MRI CERVICAL SPINE FINDINGS Alignment: Mild retrolisthesis C3-4, C5-6. 3 mm anterior listhesis C7-T1. Mild retrolisthesis T1-2. Vertebrae: Image quality degraded by motion. Allowing for this, no fracture or mass lesion identified Cord: Spinal cord signal normal. Posterior Fossa, vertebral arteries, paraspinal tissues: Negative Disc levels: C2-3:  Negative C3-4: Mild retrolisthesis. Moderate spondylosis causing mild spinal stenosis and moderate foraminal narrowing right greater than left. C4-5: Disc degeneration and spondylosis. Foraminal narrowing bilaterally due to spurring C5-6: Large left-sided osteophyte with cord flattening on the left and moderate stenosis on the left. Moderate right foraminal narrowing and mild left foraminal narrowing. C6-7: Disc degeneration and spondylosis. Diffuse uncinate spurring with mild foraminal narrowing bilaterally and mild spinal stenosis C7-T1: 3 mm anterior slip due to facet degeneration. Moderate foraminal stenosis bilaterally. MRI THORACIC SPINE FINDINGS Alignment: 2 mm retrolisthesis of T1 on T2 with disc space narrowing and endplate changes compatible with advanced disc degeneration. Remaining alignment normal. Vertebrae: Negative for fracture.  Negative for mass lesion. Cord:  Spinal cord signal is normal.  No cord lesion. Paraspinal and other soft tissues: Paraspinous soft tissues normal. No pleural effusion. Disc levels: T1-2: Retrolisthesis with disc degeneration. No significant spinal stenosis. T2-3:   Negative T3-4: Negative T4-5:  Negative T5-6:  Mild disc degeneration T6-7:  Mild disc degeneration T7-8: Advanced disc degeneration with disc space narrowing and spurring. Bilateral facet degeneration. Mild to moderate spinal stenosis. Mild foraminal narrowing bilaterally T8-9:  Mild disc and facet degeneration T9-10:  Mild disc and facet degeneration T10-11: Moderate to advanced disc degeneration and spondylosis. Bilateral facet degeneration. Moderate spinal stenosis with mild compression of the cord. No cord signal abnormality. Marked foraminal narrowing on the right and moderate foraminal narrowing on the left. T11-12: Mild retrolisthesis with disc and facet degeneration. Mild spinal stenosis. T12-L1: Disc degeneration and facet degeneration with mild spinal stenosis and left foraminal stenosis. MRI LUMBAR SPINE FINDINGS Image quality degraded by moderate motion. Segmentation:  Normal segmentation Alignment:  Mild retrolisthesis L3-4.  Anterior listhesis L4-5 Vertebrae: Pedicle screw and interbody fusion L4-5 and L5-S1. Negative for fracture or mass lesion. Conus medullaris: Extends to the L1-2 level and  appears normal. Paraspinal and other soft tissues: No paraspinous mass or fluid collection. Posterior fluid collection at the laminectomy bed has improved since the prior study now measuring 12 x 20 mm. Disc levels: L1-2: Moderate disc and facet degeneration with moderate spinal stenosis. Diffuse disc bulging and spurring with moderate spinal stenosis and moderate foraminal stenosis bilaterally L2-3:  Mild disc degeneration with mild spinal stenosis L3-4: Extensive endplate osteophyte formation. There is severe spinal stenosis. Canal not well evaluated on axial images due to artifact from hardware. There is marked foraminal encroachment bilaterally due to spurring L4-5: Pedicle screw and interbody fusion. Extensive artifact is present. No definite spinal stenosis. 6 mm anterior listhesis L4-5 L5-S1: Pedicle  screw and interbody fusion. Extensive artifact. No definite stenosis. IMPRESSION: Cervical spondylosis at multiple levels. Largest osteophyte on the left at C5-6 with cord flattening and moderate spinal stenosis. Multilevel thoracic degenerative change. Moderate spinal stenosis at T10-11 with marked foraminal narrowing on the right and moderate foraminal narrowing on the left. Mild spinal stenosis at T11-12 and T12-L1 Lumbar fusion L4-5 and L5-S1 Severe spinal stenosis at L3-4 with severe foraminal encroachment bilaterally due to diffuse endplate osteophyte formation. Moderate spinal stenosis L1-2. Moderate foraminal stenosis bilaterally at L1-2. Electronically Signed   By: Franchot Gallo M.D.   On: 01/26/2016 16:46   Mr Thoracic Spine Wo Contrast  Result Date: 01/26/2016 CLINICAL DATA:  Left leg weakness and foot drop. History of benign tumor lumbar spine. History of stroke. EXAM: MRI CERVICAL, THORACIC AND LUMBAR SPINE WITHOUT CONTRAST TECHNIQUE: Multiplanar and multiecho pulse sequences of the cervical spine, to include the craniocervical junction and cervicothoracic junction, and thoracic and lumbar spine, were obtained without intravenous contrast. COMPARISON:  Lumbar MRI 12/16/2012 FINDINGS: MRI CERVICAL SPINE FINDINGS Alignment: Mild retrolisthesis C3-4, C5-6. 3 mm anterior listhesis C7-T1. Mild retrolisthesis T1-2. Vertebrae: Image quality degraded by motion. Allowing for this, no fracture or mass lesion identified Cord: Spinal cord signal normal. Posterior Fossa, vertebral arteries, paraspinal tissues: Negative Disc levels: C2-3:  Negative C3-4: Mild retrolisthesis. Moderate spondylosis causing mild spinal stenosis and moderate foraminal narrowing right greater than left. C4-5: Disc degeneration and spondylosis. Foraminal narrowing bilaterally due to spurring C5-6: Large left-sided osteophyte with cord flattening on the left and moderate stenosis on the left. Moderate right foraminal narrowing and mild  left foraminal narrowing. C6-7: Disc degeneration and spondylosis. Diffuse uncinate spurring with mild foraminal narrowing bilaterally and mild spinal stenosis C7-T1: 3 mm anterior slip due to facet degeneration. Moderate foraminal stenosis bilaterally. MRI THORACIC SPINE FINDINGS Alignment: 2 mm retrolisthesis of T1 on T2 with disc space narrowing and endplate changes compatible with advanced disc degeneration. Remaining alignment normal. Vertebrae: Negative for fracture.  Negative for mass lesion. Cord:  Spinal cord signal is normal.  No cord lesion. Paraspinal and other soft tissues: Paraspinous soft tissues normal. No pleural effusion. Disc levels: T1-2: Retrolisthesis with disc degeneration. No significant spinal stenosis. T2-3:  Negative T3-4: Negative T4-5:  Negative T5-6:  Mild disc degeneration T6-7:  Mild disc degeneration T7-8: Advanced disc degeneration with disc space narrowing and spurring. Bilateral facet degeneration. Mild to moderate spinal stenosis. Mild foraminal narrowing bilaterally T8-9:  Mild disc and facet degeneration T9-10:  Mild disc and facet degeneration T10-11: Moderate to advanced disc degeneration and spondylosis. Bilateral facet degeneration. Moderate spinal stenosis with mild compression of the cord. No cord signal abnormality. Marked foraminal narrowing on the right and moderate foraminal narrowing on the left. T11-12: Mild retrolisthesis with disc and facet degeneration. Mild spinal  stenosis. T12-L1: Disc degeneration and facet degeneration with mild spinal stenosis and left foraminal stenosis. MRI LUMBAR SPINE FINDINGS Image quality degraded by moderate motion. Segmentation:  Normal segmentation Alignment:  Mild retrolisthesis L3-4.  Anterior listhesis L4-5 Vertebrae: Pedicle screw and interbody fusion L4-5 and L5-S1. Negative for fracture or mass lesion. Conus medullaris: Extends to the L1-2 level and appears normal. Paraspinal and other soft tissues: No paraspinous mass or  fluid collection. Posterior fluid collection at the laminectomy bed has improved since the prior study now measuring 12 x 20 mm. Disc levels: L1-2: Moderate disc and facet degeneration with moderate spinal stenosis. Diffuse disc bulging and spurring with moderate spinal stenosis and moderate foraminal stenosis bilaterally L2-3:  Mild disc degeneration with mild spinal stenosis L3-4: Extensive endplate osteophyte formation. There is severe spinal stenosis. Canal not well evaluated on axial images due to artifact from hardware. There is marked foraminal encroachment bilaterally due to spurring L4-5: Pedicle screw and interbody fusion. Extensive artifact is present. No definite spinal stenosis. 6 mm anterior listhesis L4-5 L5-S1: Pedicle screw and interbody fusion. Extensive artifact. No definite stenosis. IMPRESSION: Cervical spondylosis at multiple levels. Largest osteophyte on the left at C5-6 with cord flattening and moderate spinal stenosis. Multilevel thoracic degenerative change. Moderate spinal stenosis at T10-11 with marked foraminal narrowing on the right and moderate foraminal narrowing on the left. Mild spinal stenosis at T11-12 and T12-L1 Lumbar fusion L4-5 and L5-S1 Severe spinal stenosis at L3-4 with severe foraminal encroachment bilaterally due to diffuse endplate osteophyte formation. Moderate spinal stenosis L1-2. Moderate foraminal stenosis bilaterally at L1-2. Electronically Signed   By: Franchot Gallo M.D.   On: 01/26/2016 16:46   Mr Lumbar Spine Wo Contrast  Result Date: 01/26/2016 CLINICAL DATA:  Left leg weakness and foot drop. History of benign tumor lumbar spine. History of stroke. EXAM: MRI CERVICAL, THORACIC AND LUMBAR SPINE WITHOUT CONTRAST TECHNIQUE: Multiplanar and multiecho pulse sequences of the cervical spine, to include the craniocervical junction and cervicothoracic junction, and thoracic and lumbar spine, were obtained without intravenous contrast. COMPARISON:  Lumbar MRI  12/16/2012 FINDINGS: MRI CERVICAL SPINE FINDINGS Alignment: Mild retrolisthesis C3-4, C5-6. 3 mm anterior listhesis C7-T1. Mild retrolisthesis T1-2. Vertebrae: Image quality degraded by motion. Allowing for this, no fracture or mass lesion identified Cord: Spinal cord signal normal. Posterior Fossa, vertebral arteries, paraspinal tissues: Negative Disc levels: C2-3:  Negative C3-4: Mild retrolisthesis. Moderate spondylosis causing mild spinal stenosis and moderate foraminal narrowing right greater than left. C4-5: Disc degeneration and spondylosis. Foraminal narrowing bilaterally due to spurring C5-6: Large left-sided osteophyte with cord flattening on the left and moderate stenosis on the left. Moderate right foraminal narrowing and mild left foraminal narrowing. C6-7: Disc degeneration and spondylosis. Diffuse uncinate spurring with mild foraminal narrowing bilaterally and mild spinal stenosis C7-T1: 3 mm anterior slip due to facet degeneration. Moderate foraminal stenosis bilaterally. MRI THORACIC SPINE FINDINGS Alignment: 2 mm retrolisthesis of T1 on T2 with disc space narrowing and endplate changes compatible with advanced disc degeneration. Remaining alignment normal. Vertebrae: Negative for fracture.  Negative for mass lesion. Cord:  Spinal cord signal is normal.  No cord lesion. Paraspinal and other soft tissues: Paraspinous soft tissues normal. No pleural effusion. Disc levels: T1-2: Retrolisthesis with disc degeneration. No significant spinal stenosis. T2-3:  Negative T3-4: Negative T4-5:  Negative T5-6:  Mild disc degeneration T6-7:  Mild disc degeneration T7-8: Advanced disc degeneration with disc space narrowing and spurring. Bilateral facet degeneration. Mild to moderate spinal stenosis. Mild foraminal narrowing bilaterally  T8-9:  Mild disc and facet degeneration T9-10:  Mild disc and facet degeneration T10-11: Moderate to advanced disc degeneration and spondylosis. Bilateral facet degeneration.  Moderate spinal stenosis with mild compression of the cord. No cord signal abnormality. Marked foraminal narrowing on the right and moderate foraminal narrowing on the left. T11-12: Mild retrolisthesis with disc and facet degeneration. Mild spinal stenosis. T12-L1: Disc degeneration and facet degeneration with mild spinal stenosis and left foraminal stenosis. MRI LUMBAR SPINE FINDINGS Image quality degraded by moderate motion. Segmentation:  Normal segmentation Alignment:  Mild retrolisthesis L3-4.  Anterior listhesis L4-5 Vertebrae: Pedicle screw and interbody fusion L4-5 and L5-S1. Negative for fracture or mass lesion. Conus medullaris: Extends to the L1-2 level and appears normal. Paraspinal and other soft tissues: No paraspinous mass or fluid collection. Posterior fluid collection at the laminectomy bed has improved since the prior study now measuring 12 x 20 mm. Disc levels: L1-2: Moderate disc and facet degeneration with moderate spinal stenosis. Diffuse disc bulging and spurring with moderate spinal stenosis and moderate foraminal stenosis bilaterally L2-3:  Mild disc degeneration with mild spinal stenosis L3-4: Extensive endplate osteophyte formation. There is severe spinal stenosis. Canal not well evaluated on axial images due to artifact from hardware. There is marked foraminal encroachment bilaterally due to spurring L4-5: Pedicle screw and interbody fusion. Extensive artifact is present. No definite spinal stenosis. 6 mm anterior listhesis L4-5 L5-S1: Pedicle screw and interbody fusion. Extensive artifact. No definite stenosis. IMPRESSION: Cervical spondylosis at multiple levels. Largest osteophyte on the left at C5-6 with cord flattening and moderate spinal stenosis. Multilevel thoracic degenerative change. Moderate spinal stenosis at T10-11 with marked foraminal narrowing on the right and moderate foraminal narrowing on the left. Mild spinal stenosis at T11-12 and T12-L1 Lumbar fusion L4-5 and L5-S1  Severe spinal stenosis at L3-4 with severe foraminal encroachment bilaterally due to diffuse endplate osteophyte formation. Moderate spinal stenosis L1-2. Moderate foraminal stenosis bilaterally at L1-2. Electronically Signed   By: Franchot Gallo M.D.   On: 01/26/2016 16:46   [name] Head/brain [id number] Cm  Result Date: 01/26/2016 CLINICAL DATA:  Acute onset LEFT lower extremity weakness and sensory deficits. 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 History of hypertension, stroke, diabetes. EXAM: MRI HEAD WITHOUT CONTRAST MRA HEAD WITHOUT CONTRAST TECHNIQUE: Multiplanar, multiecho pulse sequences of the brain and surrounding structures were obtained without intravenous contrast. Angiographic images of the head were obtained using MRA technique without contrast. COMPARISON:  None. FINDINGS: MRI HEAD FINDINGS INTRACRANIAL CONTENTS: No reduced diffusion to suggest acute ischemia. Scattered small foci of susceptibility artifact scattered throughout infra and supratentorial brain including deep gray nuclei. Susceptibility artifact associated with old RIGHT basal ganglia infarct. Ventricles and sulci are normal for patient's age. Patchy supratentorial and pontine white matter FLAIR T2 hyperintensities. No midline shift, mass effect or mass lesions. No abnormal extra-axial fluid collections. Minimal susceptibility artifact within posterior fossa extra-axial space, suggesting old subarachnoid hemorrhage or possibly from prior spine surgery. ORBITS: The included ocular globes and orbital contents are non-suspicious. Status post bilateral ocular lens implants. SINUSES: Small RIGHT mucosal retention cyst. Mastoid air cells are well aerated. SKULL/SOFT TISSUES: No abnormal sellar expansion. No suspicious calvarial bone marrow signal. Craniocervical junction maintained. Patient is edentulous. MRA HEAD FINDINGS: ANTERIOR CIRCULATION: Tortuous included cervical internal carotid arteries,  associated with chronic hypertension. Flow related enhancement of the bilateral internal carotid arteries through the level of the carotid terminus. Symmetric flow related enhancement of the ophthalmic arteries. RIGHT knee  A2 segment origin infundibulum without discrete aneurysm. Patent bilateral anterior cerebral arteries. Flow related enhancement of bilateral middle cerebral arteries. No large vessel occlusion, high-grade stenosis, abnormal luminal irregularity, aneurysm. Mild dolichoectasia. POSTERIOR CIRCULATION: LEFT vertebral artery is dominant. Basilar artery is patent, with normal flow related enhancement of the main branch vessels. Mild stenosis RIGHT P1 segment. Flow related enhancement of bilateral posterior cerebral arteries. No large vessel occlusion, high-grade stenosis, abnormal luminal irregularity, aneurysm. Mild dolichoectasia. IMPRESSION: MRI HEAD: No acute intracranial process, no acute ischemia. 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 Scattered susceptibility artifact compatible chronic hypertension. Involutional changes and moderate chronic small vessel ischemic disease. MRA HEAD: No emergent large vessel occlusion or severe stenosis. Dolichoectasia associated with chronic hypertension. Electronically Signed   By: Elon Alas M.D.   On: 01/26/2016 03:21    Subjective: Stable ongoing weakness of the left leg which has not changed since admission.  Denies slurred speech, confusion, focal numbness or other changes in strength since admission  Discharge Exam: Vitals:   01/27/16 0908 01/27/16 1353  BP: 128/70 (!) 103/55  Pulse: 82 62  Resp: 20 16  Temp: 98 F (36.7 C) 97.7 F (36.5 C)   Vitals:   01/27/16 0145 01/27/16 0539 01/27/16 0908 01/27/16 1353  BP: 128/70 137/63 128/70 (!) 103/55  Pulse: 69 61 82 62  Resp: 20 20 20 16   Temp: 97.9 F (36.6 C) 97.9 F (36.6 C) 98 F (36.7 C) 97.7 F (36.5 C)  TempSrc: Oral Oral Oral Oral  SpO2: 95% 99% 96% 94%  Weight:       Height:        General exam:  Adult male.  No acute distress.  HEENT:  NCAT, MMM Respiratory system: Clear to auscultation bilaterally Cardiovascular system:  IRRR, normal 99991111. 3/6 systolic murmur.  Warm extremities Gastrointestinal system: Normal active bowel sounds, soft, nondistended, nontender. MSK:  Normal tone and bulk, no lower extremity edema Neuro:  Foot drop on the left.  CN II-XII grossly intact, strength 5/5 bilateral upper extremities and right lower extremity.  Left hip strength, knee flexion/extension appear intact.  Able to wiggle toes a little and move ankle in plantar flexion.  Mild dysmetria in the left leg.      The results of significant diagnostics from this hospitalization (including imaging, microbiology, ancillary and laboratory) are listed below for reference.     Microbiology: No results found for this or any previous visit (from the past 240 hour(s)).   Labs: BNP (last 3 results) No results for input(s): BNP in the last 8760 hours. Basic Metabolic Panel:  Recent Labs Lab 01/26/16 0442  NA 138  K 3.6  CL 102  CO2 26  GLUCOSE 173*  BUN 10  CREATININE 0.75  CALCIUM 9.3   Liver Function Tests: No results for input(s): AST, ALT, ALKPHOS, BILITOT, PROT, ALBUMIN in the last 168 hours. No results for input(s): LIPASE, AMYLASE in the last 168 hours. No results for input(s): AMMONIA in the last 168 hours. CBC:  Recent Labs Lab 01/26/16 0442  WBC 10.3  HGB 12.6*  HCT 40.0  MCV 89.3  PLT 226   Cardiac Enzymes: No results for input(s): CKTOTAL, CKMB, CKMBINDEX, TROPONINI in the last 168 hours. BNP: Invalid input(s): POCBNP CBG:  Recent Labs Lab 01/26/16 1152 01/26/16 1628 01/26/16 2150 01/27/16 0618 01/27/16 1133  GLUCAP 174* 183* 194* 159* 215*   D-Dimer No results for input(s): DDIMER in the last 72 hours. Hgb A1c  Recent Labs  01/26/16 0442  HGBA1C 8.4*   Lipid Profile  Recent Labs  01/26/16 0442  CHOL 120  HDL  26*  LDLCALC 64  TRIG 148  CHOLHDL 4.6   Thyroid function studies No results for input(s): TSH, T4TOTAL, T3FREE, THYROIDAB in the last 72 hours.  Invalid input(s): FREET3 Anemia work up No results for input(s): VITAMINB12, FOLATE, FERRITIN, TIBC, IRON, RETICCTPCT in the last 72 hours. Urinalysis No results found for: COLORURINE, APPEARANCEUR, LABSPEC, [place], GLUCOSEU, HGBUR, BILIRUBINUR, KETONESUR, PROTEINUR, UROBILINOGEN, NITRITE, LEUKOCYTESUR Sepsis Labs Invalid input(s): PROCALCITONIN,  WBC,  LACTICIDVEN   Time coordinating discharge: Over 30 minutes  SIGNED:   Janece Canterbury, MD  [facility] 01/27/2016, 2:29 PM Pager   If 7PM-7AM, please contact night-coverage [email or web address] Password [email or web address]

## 2016-01-27 NOTE — Progress Notes (Signed)
Inpatient Diabetes Program Recommendations  AACE/ADA: New Consensus Statement on Inpatient Glycemic Control (2015)  Target Ranges:  Prepandial:   less than 140 mg/dL      Peak postprandial:   less than 180 mg/dL (1-2 hours)      Critically ill patients:  140 - 180 mg/dL   Results for Phillip Chandler, Phillip Chandler (MRN [id number]) as of 01/27/2016 13:20  Ref. Range 01/26/2016 06:27 01/26/2016 11:52 01/26/2016 16:28 01/26/2016 21:50  Glucose-Capillary Latest Ref Range: 65 - 99 mg/dL 163 (H) 174 (H) 183 (H) 194 (H)   Results for Phillip Chandler, Phillip Chandler (MRN [id number]) as of 01/27/2016 13:20  Ref. Range 01/27/2016 06:18 01/27/2016 11:33  Glucose-Capillary Latest Ref Range: 65 - 99 mg/dL 159 (H) 215 (H)    Admit with: LE Numbness/ Rule Out CVA  History: DM, CVA, CHF  Home DM Meds: Lantus 25 units QHS       Janumet 50/500 bid  Current Insulin Orders: Lantus 25 units QHS      Novolog Moderate Correction Scale/ SSI (0-15 units) TID AC      MD- Please consider the following in-hospital insulin adjustments while home Janumet on hold:  1. Increase Lantus slightly to 28 units QHS  2. Start low dose Novolog Meal Coverage: Novolog 3 units tid with meals (hold if pt eats <50% of meal)     --Will follow patient during hospitalization--  Wyn Quaker RN, MSN, CDE Diabetes Coordinator Inpatient Glycemic Control Team Team Pager: [phone] (8a-5p)

## 2016-01-27 NOTE — Progress Notes (Signed)
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 MRI on 8/27 negative for acute infarct. 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    OT comments  Pt making good progress with functional goals. OT will continue to follow  Follow Up Recommendations  Home health OT;Supervision/Assistance - 24 hour    Equipment Recommendations  None recommended by OT    Recommendations for Other Services      Precautions / Restrictions Precautions Precautions: Fall Required Braces or Orthoses: Other Brace/Splint Other Brace/Splint: bil AFOs Restrictions Weight Bearing Restrictions: No       Mobility Bed Mobility               General bed mobility comments: pt up in chair  Transfers Overall transfer level: Needs assistance Equipment used: Rolling walker (2 wheeled) Transfers: Sit to/from Stand Sit to Stand: Min assist         General transfer comment: v/c's for safe hand placement    Balance Overall balance assessment: Needs assistance Sitting-balance support: Feet supported;No upper extremity supported Sitting balance-Leahy Scale: Good Sitting balance - Comments: pt attempted to don socks but unable, did assist PT with donning of bilat AFOs   Standing balance support: During functional activity Standing balance-Leahy Scale: Poor Standing balance comment: RW for support                   ADL Overall ADL's : Needs assistance/impaired     Grooming: Oral care;Wash/dry hands;Wash/dry face;Brushing hair;Min  guard;Standing   Upper Body Bathing: Set up;Supervision/ safety;Sitting   Lower Body Bathing: Sit to/from stand;Minimal assistance   Upper Body Dressing : Set up;Supervision/safety;Sitting   Lower Body Dressing: Maximal assistance;Sit to/from stand Lower Body Dressing Details (indicate cue type and reason): unable to bend down to donn socks and shoes Toilet Transfer: Minimal assistance;Ambulation;RW;Comfort height toilet;Cueing for safety   Toileting- Clothing Manipulation and Hygiene: Sit to/from stand;Min guard   Tub/ Shower Transfer: 3 in 1;Grab bars;Ambulation;Minimal assistance;Cueing for safety   Functional mobility during ADLs: Minimal assistance;Rolling walker;Cueing for safety General ADL Comments: assisted pt with ADLs in bathroom at sink this morning      Vision  wears glasses, no change from baseline                              Cognition   Behavior During Therapy: [facility] for tasks assessed/performed Overall Cognitive Status: Within Functional Limits for tasks assessed                       Extremity/Trunk Assessment   WFL                        General Comments  pt very pleasant and cooperative, jovial    Pertinent Vitals/ Pain       Pain Assessment: No/denies pain  Home Living  lives at home with wife  Prior Functioning/Environment  intermittent assist with LB ADLs           Frequency Min 2X/week     Progress Toward Goals  OT Goals(current goals can now be found in the care plan section)  Progress towards OT goals: Progressing toward goals  Acute Rehab OT Goals Patient Stated Goal: return home  Plan Discharge plan remains appropriate                     End of Session Equipment Utilized During Treatment: Gait belt;Rolling walker;Other (comment) (3 in 1)   Activity Tolerance Patient tolerated treatment well   Patient Left with call bell/phone  within reach;in chair             Time: 1019-1055 OT Time Calculation (min): 36 min  Charges: OT General Charges $OT Visit: 1 Procedure OT Treatments $Self Care/Home Management : 23-37 mins  Britt Bottom 01/27/2016, 1:09 PM

## 2016-01-27 NOTE — Progress Notes (Signed)
VASCULAR LAB PRELIMINARY  PRELIMINARY  PRELIMINARY  PRELIMINARY  Carotid duplex completed.    Preliminary report:  Bilateral - 1% to 39% ICA stenosis. Right vertebral artery and bilateral subclavian not visualized due to respiratory interference  Phillip Chandler, RVS 01/27/2016, 1:15 PM

## 2016-01-27 NOTE — Discharge Instructions (Addendum)
Stroke Prevention Some health problems and behaviors may make it more likely for you to have a stroke. Below are ways to lessen your risk of having a stroke.   Be active for at least 30 minutes on most or all days.  Do not smoke. Try not to be around others who smoke.  Do not drink too much alcohol.  Do not have more than 2 drinks a day if you are a man.  Do not have more than 1 drink a day if you are a woman and are not pregnant.  Eat healthy foods, such as fruits and vegetables. If you were put on a specific diet, follow the diet as told.  Keep your cholesterol levels under control through diet and medicines. Look for foods that are low in saturated fat, trans fat, cholesterol, and are high in fiber.  If you have diabetes, follow all diet plans and take your medicine as told.  Ask your doctor if you need treatment to lower your blood pressure. If you have high blood pressure (hypertension), follow all diet plans and take your medicine as told by your doctor.  If you are 71-12 years old, have your blood pressure checked every 3-5 years. If you are age 53 or older, have your blood pressure checked every year.  Keep a healthy weight. Eat foods that are low in calories, salt, saturated fat, trans fat, and cholesterol.  Do not take drugs.  Avoid birth control pills, if this applies. Talk to your doctor about the risks of taking birth control pills.  Talk to your doctor if you have sleep problems (sleep apnea).  Take all medicine as told by your doctor.  You may be told to take aspirin or blood thinner medicine. Take this medicine as told by your doctor.  Understand your medicine instructions.  Make sure any other conditions you have are being taken care of. GET HELP RIGHT AWAY IF:  You suddenly lose feeling (you feel numb) or have weakness in your face, arm, or leg.  Your face or eyelid hangs down to one side.  You suddenly feel confused.  You have trouble talking (aphasia)  or understanding what people are saying.  You suddenly have trouble seeing in one or both eyes.  You suddenly have trouble walking.  You are dizzy.  You lose your balance or your movements are clumsy (uncoordinated).  You suddenly have a very bad headache and you do not know the cause.  You have new chest pain.  Your heart feels like it is fluttering or skipping a beat (irregular heartbeat). Do not wait to see if the symptoms above go away. Get help right away. Call your local emergency services (911 in [place] Do not drive yourself to the hospital.   This information is not intended to replace advice given to you by your health care provider. Make sure you discuss any questions you have with your health care provider.   Document Released: 11/17/2011 Document Revised: 06/08/2014 Document Reviewed: 11/18/2012 Elsevier Interactive Patient Education 2016 [place] on my medicine - XARELTO (Rivaroxaban)  This medication education was reviewed with me or my healthcare representative as part of my discharge preparation.    Why was Xarelto prescribed for you? Xarelto was prescribed for you to reduce the risk of a blood clot forming that can cause a stroke if you have a medical condition called atrial fibrillation (a type of irregular heartbeat).  What do you need to know about xarelto ? Take  your Xarelto ONCE DAILY at the same time every day with your evening meal. If you have difficulty swallowing the tablet whole, you may crush it and mix in applesauce just prior to taking your dose.  Take Xarelto exactly as prescribed by your doctor and DO NOT stop taking Xarelto without talking to the doctor who prescribed the medication.  Stopping without other stroke prevention medication to take the place of Xarelto may increase your risk of developing a clot that causes a stroke.  Refill your prescription before you run out.  After discharge, you should have regular check-up  appointments with your healthcare provider that is prescribing your Xarelto.  In the future your dose may need to be changed if your kidney function or weight changes by a significant amount.  What do you do if you miss a dose? If you are taking Xarelto ONCE DAILY and you miss a dose, take it as soon as you remember on the same day then continue your regularly scheduled once daily regimen the next day. Do not take two doses of Xarelto at the same time or on the same day.   Important Safety Information A possible side effect of Xarelto is bleeding. You should call your healthcare provider right away if you experience any of the following: ? Bleeding from an injury or your nose that does not stop. ? Unusual colored urine (red or dark brown) or unusual colored stools (red or black). ? Unusual bruising for unknown reasons. ? A serious fall or if you hit your head (even if there is no bleeding).  Some medicines may interact with Xarelto and might increase your risk of bleeding while on Xarelto. To help avoid this, consult your healthcare provider or pharmacist prior to using any new prescription or non-prescription medications, including herbals, vitamins, non-steroidal anti-inflammatory drugs (NSAIDs) and supplements.  This website has more information on Xarelto: [email or web address]-benson.[email or web address]

## 2016-01-28 DIAGNOSIS — Z7901 Long term (current) use of anticoagulants: Secondary | ICD-10-CM | POA: Diagnosis not present

## 2016-01-28 DIAGNOSIS — I10 Essential (primary) hypertension: Secondary | ICD-10-CM | POA: Diagnosis not present

## 2016-01-28 DIAGNOSIS — I5032 Chronic diastolic (congestive) heart failure: Secondary | ICD-10-CM | POA: Diagnosis not present

## 2016-01-28 DIAGNOSIS — I482 Chronic atrial fibrillation: Secondary | ICD-10-CM | POA: Diagnosis not present

## 2016-01-28 DIAGNOSIS — R531 Weakness: Secondary | ICD-10-CM | POA: Diagnosis not present

## 2016-01-28 LAB — GLUCOSE, CAPILLARY
Glucose-Capillary: 168 mg/dL — ABNORMAL HIGH (ref 65–99)
Glucose-Capillary: 212 mg/dL — ABNORMAL HIGH (ref 65–99)
Glucose-Capillary: 227 mg/dL — ABNORMAL HIGH (ref 65–99)

## 2016-01-28 NOTE — Progress Notes (Signed)
Patient seen and examined today.  Left leg continues to swing in while walking and externally rotate.  Denies pain.  Exam unchanged from prior except pain is wearing his leg braces today so pedal plantar and dorsiflexion not tested.  Patient seen by Dr. Cram this morning who agrees with therapy and recommends outpatient follow up.  Social work met with patient and family and reviewed case, however, insurance will not approve SNF placement at this time and family unable to pay out of pocket for SNF stay.  Discharge to home with home health services.   

## 2016-01-28 NOTE — Progress Notes (Signed)
Pt d/c to home by car with family. Assessment stable. All questions answered. 

## 2016-01-28 NOTE — Progress Notes (Signed)
Subjective: Patient reports Patient states he feels some better but most of the impetus of the decline over the weekend was all Center on the left knee and the left leg. He denies any significant neck pain denies any significant mid back pain denies any significant low back pain and the pain that he has his leg does not appear with any radicular pain  Objective: Vital signs in last 24 hours: Temp:  [97.6 F (36.4 C)-98 F (36.7 C)] 97.9 F (36.6 C) (08/29 0419) Pulse Rate:  [62-82] 73 (08/29 0419) Resp:  [16-20] 18 (08/29 0419) BP: (103-148)/(55-70) 134/64 (08/29 0419) SpO2:  [94 %-98 %] 98 % (08/29 0419)  Intake/Output from previous day: 08/28 0701 - 08/29 0700 In: 480 [P.O.:480] Out: 1200 [Urine:1200] Intake/Output this shift: No intake/output data recorded.  Awake alert strength appears to be 4-4+ proximal left lower extremity patient has baseline bilateral foot drops  Lab Results:  Recent Labs  01/26/16 0442  WBC 10.3  HGB 12.6*  HCT 40.0  PLT 226   BMET  Recent Labs  01/26/16 0442  NA 138  K 3.6  CL 102  CO2 26  GLUCOSE 173*  BUN 10  CREATININE 0.75  CALCIUM 9.3    Studies/Results: Mr Cervical Spine Wo Contrast  Result Date: 01/26/2016 CLINICAL DATA:  Left leg weakness and foot drop. History of benign tumor lumbar spine. History of stroke. EXAM: MRI CERVICAL, THORACIC AND LUMBAR SPINE WITHOUT CONTRAST TECHNIQUE: Multiplanar and multiecho pulse sequences of the cervical spine, to include the craniocervical junction and cervicothoracic junction, and thoracic and lumbar spine, were obtained without intravenous contrast. COMPARISON:  Lumbar MRI 12/16/2012 FINDINGS: MRI CERVICAL SPINE FINDINGS Alignment: Mild retrolisthesis C3-4, C5-6. 3 mm anterior listhesis C7-T1. Mild retrolisthesis T1-2. Vertebrae: Image quality degraded by motion. Allowing for this, no fracture or mass lesion identified Cord: Spinal cord signal normal. Posterior Fossa, vertebral arteries,  paraspinal tissues: Negative Disc levels: C2-3:  Negative C3-4: Mild retrolisthesis. Moderate spondylosis causing mild spinal stenosis and moderate foraminal narrowing right greater than left. C4-5: Disc degeneration and spondylosis. Foraminal narrowing bilaterally due to spurring C5-6: Large left-sided osteophyte with cord flattening on the left and moderate stenosis on the left. Moderate right foraminal narrowing and mild left foraminal narrowing. C6-7: Disc degeneration and spondylosis. Diffuse uncinate spurring with mild foraminal narrowing bilaterally and mild spinal stenosis C7-T1: 3 mm anterior slip due to facet degeneration. Moderate foraminal stenosis bilaterally. MRI THORACIC SPINE FINDINGS Alignment: 2 mm retrolisthesis of T1 on T2 with disc space narrowing and endplate changes compatible with advanced disc degeneration. Remaining alignment normal. Vertebrae: Negative for fracture.  Negative for mass lesion. Cord:  Spinal cord signal is normal.  No cord lesion. Paraspinal and other soft tissues: Paraspinous soft tissues normal. No pleural effusion. Disc levels: T1-2: Retrolisthesis with disc degeneration. No significant spinal stenosis. T2-3:  Negative T3-4: Negative T4-5:  Negative T5-6:  Mild disc degeneration T6-7:  Mild disc degeneration T7-8: Advanced disc degeneration with disc space narrowing and spurring. Bilateral facet degeneration. Mild to moderate spinal stenosis. Mild foraminal narrowing bilaterally T8-9:  Mild disc and facet degeneration T9-10:  Mild disc and facet degeneration T10-11: Moderate to advanced disc degeneration and spondylosis. Bilateral facet degeneration. Moderate spinal stenosis with mild compression of the cord. No cord signal abnormality. Marked foraminal narrowing on the right and moderate foraminal narrowing on the left. T11-12: Mild retrolisthesis with disc and facet degeneration. Mild spinal stenosis. T12-L1: Disc degeneration and facet degeneration with mild spinal  stenosis and  left foraminal stenosis. MRI LUMBAR SPINE FINDINGS Image quality degraded by moderate motion. Segmentation:  Normal segmentation Alignment:  Mild retrolisthesis L3-4.  Anterior listhesis L4-5 Vertebrae: Pedicle screw and interbody fusion L4-5 and L5-S1. Negative for fracture or mass lesion. Conus medullaris: Extends to the L1-2 level and appears normal. Paraspinal and other soft tissues: No paraspinous mass or fluid collection. Posterior fluid collection at the laminectomy bed has improved since the prior study now measuring 12 x 20 mm. Disc levels: L1-2: Moderate disc and facet degeneration with moderate spinal stenosis. Diffuse disc bulging and spurring with moderate spinal stenosis and moderate foraminal stenosis bilaterally L2-3:  Mild disc degeneration with mild spinal stenosis L3-4: Extensive endplate osteophyte formation. There is severe spinal stenosis. Canal not well evaluated on axial images due to artifact from hardware. There is marked foraminal encroachment bilaterally due to spurring L4-5: Pedicle screw and interbody fusion. Extensive artifact is present. No definite spinal stenosis. 6 mm anterior listhesis L4-5 L5-S1: Pedicle screw and interbody fusion. Extensive artifact. No definite stenosis. IMPRESSION: Cervical spondylosis at multiple levels. Largest osteophyte on the left at C5-6 with cord flattening and moderate spinal stenosis. Multilevel thoracic degenerative change. Moderate spinal stenosis at T10-11 with marked foraminal narrowing on the right and moderate foraminal narrowing on the left. Mild spinal stenosis at T11-12 and T12-L1 Lumbar fusion L4-5 and L5-S1 Severe spinal stenosis at L3-4 with severe foraminal encroachment bilaterally due to diffuse endplate osteophyte formation. Moderate spinal stenosis L1-2. Moderate foraminal stenosis bilaterally at L1-2. Electronically Signed   By: Franchot Gallo M.D.   On: 01/26/2016 16:46   Mr Thoracic Spine Wo Contrast  Result Date:  01/26/2016 CLINICAL DATA:  Left leg weakness and foot drop. History of benign tumor lumbar spine. History of stroke. EXAM: MRI CERVICAL, THORACIC AND LUMBAR SPINE WITHOUT CONTRAST TECHNIQUE: Multiplanar and multiecho pulse sequences of the cervical spine, to include the craniocervical junction and cervicothoracic junction, and thoracic and lumbar spine, were obtained without intravenous contrast. COMPARISON:  Lumbar MRI 12/16/2012 FINDINGS: MRI CERVICAL SPINE FINDINGS Alignment: Mild retrolisthesis C3-4, C5-6. 3 mm anterior listhesis C7-T1. Mild retrolisthesis T1-2. Vertebrae: Image quality degraded by motion. Allowing for this, no fracture or mass lesion identified Cord: Spinal cord signal normal. Posterior Fossa, vertebral arteries, paraspinal tissues: Negative Disc levels: C2-3:  Negative C3-4: Mild retrolisthesis. Moderate spondylosis causing mild spinal stenosis and moderate foraminal narrowing right greater than left. C4-5: Disc degeneration and spondylosis. Foraminal narrowing bilaterally due to spurring C5-6: Large left-sided osteophyte with cord flattening on the left and moderate stenosis on the left. Moderate right foraminal narrowing and mild left foraminal narrowing. C6-7: Disc degeneration and spondylosis. Diffuse uncinate spurring with mild foraminal narrowing bilaterally and mild spinal stenosis C7-T1: 3 mm anterior slip due to facet degeneration. Moderate foraminal stenosis bilaterally. MRI THORACIC SPINE FINDINGS Alignment: 2 mm retrolisthesis of T1 on T2 with disc space narrowing and endplate changes compatible with advanced disc degeneration. Remaining alignment normal. Vertebrae: Negative for fracture.  Negative for mass lesion. Cord:  Spinal cord signal is normal.  No cord lesion. Paraspinal and other soft tissues: Paraspinous soft tissues normal. No pleural effusion. Disc levels: T1-2: Retrolisthesis with disc degeneration. No significant spinal stenosis. T2-3:  Negative T3-4: Negative T4-5:   Negative T5-6:  Mild disc degeneration T6-7:  Mild disc degeneration T7-8: Advanced disc degeneration with disc space narrowing and spurring. Bilateral facet degeneration. Mild to moderate spinal stenosis. Mild foraminal narrowing bilaterally T8-9:  Mild disc and facet degeneration T9-10:  Mild disc and  facet degeneration T10-11: Moderate to advanced disc degeneration and spondylosis. Bilateral facet degeneration. Moderate spinal stenosis with mild compression of the cord. No cord signal abnormality. Marked foraminal narrowing on the right and moderate foraminal narrowing on the left. T11-12: Mild retrolisthesis with disc and facet degeneration. Mild spinal stenosis. T12-L1: Disc degeneration and facet degeneration with mild spinal stenosis and left foraminal stenosis. MRI LUMBAR SPINE FINDINGS Image quality degraded by moderate motion. Segmentation:  Normal segmentation Alignment:  Mild retrolisthesis L3-4.  Anterior listhesis L4-5 Vertebrae: Pedicle screw and interbody fusion L4-5 and L5-S1. Negative for fracture or mass lesion. Conus medullaris: Extends to the L1-2 level and appears normal. Paraspinal and other soft tissues: No paraspinous mass or fluid collection. Posterior fluid collection at the laminectomy bed has improved since the prior study now measuring 12 x 20 mm. Disc levels: L1-2: Moderate disc and facet degeneration with moderate spinal stenosis. Diffuse disc bulging and spurring with moderate spinal stenosis and moderate foraminal stenosis bilaterally L2-3:  Mild disc degeneration with mild spinal stenosis L3-4: Extensive endplate osteophyte formation. There is severe spinal stenosis. Canal not well evaluated on axial images due to artifact from hardware. There is marked foraminal encroachment bilaterally due to spurring L4-5: Pedicle screw and interbody fusion. Extensive artifact is present. No definite spinal stenosis. 6 mm anterior listhesis L4-5 L5-S1: Pedicle screw and interbody fusion.  Extensive artifact. No definite stenosis. IMPRESSION: Cervical spondylosis at multiple levels. Largest osteophyte on the left at C5-6 with cord flattening and moderate spinal stenosis. Multilevel thoracic degenerative change. Moderate spinal stenosis at T10-11 with marked foraminal narrowing on the right and moderate foraminal narrowing on the left. Mild spinal stenosis at T11-12 and T12-L1 Lumbar fusion L4-5 and L5-S1 Severe spinal stenosis at L3-4 with severe foraminal encroachment bilaterally due to diffuse endplate osteophyte formation. Moderate spinal stenosis L1-2. Moderate foraminal stenosis bilaterally at L1-2. Electronically Signed   By: Franchot Gallo M.D.   On: 01/26/2016 16:46   Mr Lumbar Spine Wo Contrast  Result Date: 01/26/2016 CLINICAL DATA:  Left leg weakness and foot drop. History of benign tumor lumbar spine. History of stroke. EXAM: MRI CERVICAL, THORACIC AND LUMBAR SPINE WITHOUT CONTRAST TECHNIQUE: Multiplanar and multiecho pulse sequences of the cervical spine, to include the craniocervical junction and cervicothoracic junction, and thoracic and lumbar spine, were obtained without intravenous contrast. COMPARISON:  Lumbar MRI 12/16/2012 FINDINGS: MRI CERVICAL SPINE FINDINGS Alignment: Mild retrolisthesis C3-4, C5-6. 3 mm anterior listhesis C7-T1. Mild retrolisthesis T1-2. Vertebrae: Image quality degraded by motion. Allowing for this, no fracture or mass lesion identified Cord: Spinal cord signal normal. Posterior Fossa, vertebral arteries, paraspinal tissues: Negative Disc levels: C2-3:  Negative C3-4: Mild retrolisthesis. Moderate spondylosis causing mild spinal stenosis and moderate foraminal narrowing right greater than left. C4-5: Disc degeneration and spondylosis. Foraminal narrowing bilaterally due to spurring C5-6: Large left-sided osteophyte with cord flattening on the left and moderate stenosis on the left. Moderate right foraminal narrowing and mild left foraminal narrowing.  C6-7: Disc degeneration and spondylosis. Diffuse uncinate spurring with mild foraminal narrowing bilaterally and mild spinal stenosis C7-T1: 3 mm anterior slip due to facet degeneration. Moderate foraminal stenosis bilaterally. MRI THORACIC SPINE FINDINGS Alignment: 2 mm retrolisthesis of T1 on T2 with disc space narrowing and endplate changes compatible with advanced disc degeneration. Remaining alignment normal. Vertebrae: Negative for fracture.  Negative for mass lesion. Cord:  Spinal cord signal is normal.  No cord lesion. Paraspinal and other soft tissues: Paraspinous soft tissues normal. No pleural effusion. Disc levels:  T1-2: Retrolisthesis with disc degeneration. No significant spinal stenosis. T2-3:  Negative T3-4: Negative T4-5:  Negative T5-6:  Mild disc degeneration T6-7:  Mild disc degeneration T7-8: Advanced disc degeneration with disc space narrowing and spurring. Bilateral facet degeneration. Mild to moderate spinal stenosis. Mild foraminal narrowing bilaterally T8-9:  Mild disc and facet degeneration T9-10:  Mild disc and facet degeneration T10-11: Moderate to advanced disc degeneration and spondylosis. Bilateral facet degeneration. Moderate spinal stenosis with mild compression of the cord. No cord signal abnormality. Marked foraminal narrowing on the right and moderate foraminal narrowing on the left. T11-12: Mild retrolisthesis with disc and facet degeneration. Mild spinal stenosis. T12-L1: Disc degeneration and facet degeneration with mild spinal stenosis and left foraminal stenosis. MRI LUMBAR SPINE FINDINGS Image quality degraded by moderate motion. Segmentation:  Normal segmentation Alignment:  Mild retrolisthesis L3-4.  Anterior listhesis L4-5 Vertebrae: Pedicle screw and interbody fusion L4-5 and L5-S1. Negative for fracture or mass lesion. Conus medullaris: Extends to the L1-2 level and appears normal. Paraspinal and other soft tissues: No paraspinous mass or fluid collection. Posterior  fluid collection at the laminectomy bed has improved since the prior study now measuring 12 x 20 mm. Disc levels: L1-2: Moderate disc and facet degeneration with moderate spinal stenosis. Diffuse disc bulging and spurring with moderate spinal stenosis and moderate foraminal stenosis bilaterally L2-3:  Mild disc degeneration with mild spinal stenosis L3-4: Extensive endplate osteophyte formation. There is severe spinal stenosis. Canal not well evaluated on axial images due to artifact from hardware. There is marked foraminal encroachment bilaterally due to spurring L4-5: Pedicle screw and interbody fusion. Extensive artifact is present. No definite spinal stenosis. 6 mm anterior listhesis L4-5 L5-S1: Pedicle screw and interbody fusion. Extensive artifact. No definite stenosis. IMPRESSION: Cervical spondylosis at multiple levels. Largest osteophyte on the left at C5-6 with cord flattening and moderate spinal stenosis. Multilevel thoracic degenerative change. Moderate spinal stenosis at T10-11 with marked foraminal narrowing on the right and moderate foraminal narrowing on the left. Mild spinal stenosis at T11-12 and T12-L1 Lumbar fusion L4-5 and L5-S1 Severe spinal stenosis at L3-4 with severe foraminal encroachment bilaterally due to diffuse endplate osteophyte formation. Moderate spinal stenosis L1-2. Moderate foraminal stenosis bilaterally at L1-2. Electronically Signed   By: Franchot Gallo M.D.   On: 01/26/2016 16:46    Assessment/Plan: Agree with continued mobilization with physical and occupational therapy not convinced this is related to his cervical thoracic lumbar spine disease. He certainly has significant cord compression both cervical and thoracic however appears a symptomatically from this. Recommend continued therapy discharge or transfer to rehabilitation when appropriate would recommend follow-up with me in my office and we'll arrange EMG nerve conduction to rule out an acute peripheral  neuropathy  LOS: 2 days     Genella Bas P 01/28/2016, 7:45 AM

## 2016-01-28 NOTE — Care Management Obs Status (Signed)
[place] NOTIFICATION   Patient Details  Name: Phillip Chandler MRN: [id number] Date of Birth: February 14, 1931   Medicare Observation Status Notification Given:       Pollie Friar, RN 01/28/2016, 9:25 AM

## 2016-01-28 NOTE — Clinical Social Work Note (Signed)
Clinical Social Work Assessment  Patient Details  Name: Phillip Chandler MRN: [id number] Date of Birth: 05/09/1931  Date of referral:  01/28/16               Reason for consult:  Facility Placement                Permission sought to share information with:  Family Supports Permission granted to share information::  Yes, Verbal Permission Granted   Housing/Transportation Living arrangements for the past 2 months:  Single Family Home Source of Information:  Patient Patient Interpreter Needed:  None Criminal Activity/Legal Involvement Pertinent to Current Situation/Hospitalization:  No - Comment as needed Significant Relationships:  Adult Children, Spouse Lives with:  Spouse Do you feel safe going back to the place where you live?  Yes Need for family participation in patient care:  No (Coment)  Care giving concerns:  Pt lives with wife who is blind.   Social Worker assessment / plan:  CSW met with pt to address consult for New SNF. PT is recommending HHPT with 24 supervision. CSW explained the process of discharging to SNF with Medicare. Pt is Medicare OBS, therefore Medicare will not cover STR at SNF. Pt reported that he has not been admitted in the last 30 days. Pt will be private pay at SNF, which pt shared that he is unable to pay for. Pt has opted for home health services. Pt also shared that his daughter will be staying with him at home. CSW updated RNCM. CSW is signing off as no further needs identified.   Employment status:  Retired Insurance information:  Medicare PT Recommendations:  Home with Home Health, 24 Hour Supervision Information / Referral to community resources:  Skilled Nursing Facility  Patient/Family's Response to care:  Pt was appreciative of CSW support.   Patient/Family's Understanding of and Emotional Response to Diagnosis, Current Treatment, and Prognosis:  Pt understands that the recommendation of 24 hours supervision and will have daughter stay with him.    Emotional Assessment Appearance:  Appears stated age Attitude/Demeanor/Rapport:  Other (Appropriate) Affect (typically observed):  Accepting, Adaptable, Pleasant Orientation:  Oriented to Self, Oriented to Place, Oriented to  Time, Oriented to Situation Alcohol / Substance use:  Never Used Psych involvement (Current and /or in the community):  No (Comment)  Discharge Needs  Concerns to be addressed:  Other (Comment Required (PT is recommending 24 hour supervision) Readmission within the last 30 days:  No Current discharge risk:  Other (PT is recommending 24 hours supervision) Barriers to Discharge:   None    , LCSW 01/28/2016, 11:50 AM  

## 2016-01-30 LAB — VAS US CAROTID
LEFT ECA DIAS: -6 cm/s
LEFT VERTEBRAL DIAS: -10 cm/s
Left CCA dist dias: -9 cm/s
Left CCA dist sys: -58 cm/s
Left CCA prox dias: 9 cm/s
Left CCA prox sys: 108 cm/s
Left ICA dist dias: -12 cm/s
Left ICA dist sys: -52 cm/s
Left ICA prox dias: -10 cm/s
Left ICA prox sys: -45 cm/s
RIGHT ECA DIAS: -1 cm/s
Right CCA prox dias: 7 cm/s
Right CCA prox sys: 55 cm/s
Right cca dist sys: 48 cm/s

## 2016-02-07 NOTE — Progress Notes (Signed)
Late entry for missed g-code    01/26/16 1353  OT Time Calculation  OT Start Time (ACUTE ONLY) 1351  OT Stop Time (ACUTE ONLY) 1409  OT Time Calculation (min) 18 min  OT G-codes **NOT FOR INPATIENT CLASS**  Functional Assessment Tool Used Clinical judgement  Functional Limitation Self care  Self Care Current Status [id number]) CJ  Self Care Goal Status [id number]) CJ  Self Care Discharge Status [id number]) CJ  OT General Charges  $OT Visit 1 Procedure  OT Evaluation  $OT Eval Moderate Complexity 1 Procedure   Truman Hayward M.S., OTR/L Pager: [phone]

## 2016-04-10 ENCOUNTER — Ambulatory Visit: Payer: Medicare Other | Admitting: Sports Medicine

## 2016-08-21 ENCOUNTER — Ambulatory Visit: Payer: Medicare Other | Admitting: Sports Medicine

## 2016-09-11 ENCOUNTER — Ambulatory Visit: Payer: Medicare Other | Admitting: Sports Medicine

## 2017-07-15 ENCOUNTER — Ambulatory Visit: Payer: Medicare Other | Admitting: Sports Medicine

## 2017-07-16 ENCOUNTER — Ambulatory Visit (INDEPENDENT_AMBULATORY_CARE_PROVIDER_SITE_OTHER): Payer: Medicare Other | Admitting: Sports Medicine

## 2017-07-16 ENCOUNTER — Encounter: Payer: Self-pay | Admitting: Sports Medicine

## 2017-07-16 DIAGNOSIS — B351 Tinea unguium: Secondary | ICD-10-CM | POA: Diagnosis not present

## 2017-07-16 DIAGNOSIS — M21371 Foot drop, right foot: Secondary | ICD-10-CM | POA: Diagnosis not present

## 2017-07-16 DIAGNOSIS — E1142 Type 2 diabetes mellitus with diabetic polyneuropathy: Secondary | ICD-10-CM

## 2017-07-16 DIAGNOSIS — M79676 Pain in unspecified toe(s): Secondary | ICD-10-CM | POA: Diagnosis not present

## 2017-07-16 DIAGNOSIS — M21372 Foot drop, left foot: Secondary | ICD-10-CM | POA: Diagnosis not present

## 2017-07-16 NOTE — Progress Notes (Signed)
Patient ID: Phillip Chandler, male   DOB: May 12, 1931, 84 y.o.   MRN: [id number] Subjective: Phillip Chandler is a 84 y.o. male patient with history of diabetes who presents to office today complaining of long, painful nails  while ambulating in shoes; unable to trim. Patient states that the glucose reading this morning was 129. Patient denies any new changes in medication or new problems. No other pedal complaints.  Patient LOV with Dr. Malen Gauze was last week where his hemoglobin A1c was noted to be 7.  Patient Active Problem List   Diagnosis Date Noted  . Spinal stenosis in cervical region 01/27/2016  . Spinal stenosis, thoracic 01/27/2016  . Chronic anticoagulation   . Weakness of left leg 01/25/2016  . Accelerated hypertension 01/25/2016  . Undiagnosed cardiac murmurs 01/25/2016  . CVA (cerebral infarction) 01/25/2016  . Acid reflux 06/04/2015  . Airway hyperreactivity 06/04/2015  . Atrial fibrillation ([place]) 06/04/2015  . Ulcerative colitis ([place]) 06/04/2015  . Diastolic heart failure ([place]) 06/04/2015  . BP (high blood pressure) 06/04/2015  . Pure hypercholesterolemia 06/04/2015  . Cellulitis of extremity 09/12/2014  . Encounter for surgical follow-up care 08/22/2014  . Pain in shoulder 08/02/2014  . Pain in the wrist 08/02/2014  . Encounter for other preprocedural examination 08/02/2014  . Carpal tunnel syndrome 05/30/2014  . Entrapment of ulnar nerve 05/30/2014  . Nerve root pain 01/25/2014  . Absolute anemia 01/18/2014  . Brachial plexus injury 01/18/2014  . Rupture long head biceps tendon 01/18/2014  . Degenerative arthritis of spine 01/18/2014  . Ecchymoses, spontaneous 01/18/2014  . Synovitis or tenosynovitis of wrist 01/18/2014  . Thoracic and lumbosacral neuritis 01/18/2014  . Left sided ulcerative colitis ([place]) 01/18/2014  . Neuropathic ulnar nerve 01/18/2014  . Umbilical hernia without obstruction and without gangrene 01/18/2014  . Hernia of anterior abdominal wall  01/18/2014  . Asthmatic breathing 01/18/2014  . Allergic rhinitis 01/17/2014  . Benign essential HTN 01/17/2014  . Callosity 01/17/2014  . Carotid artery narrowing 01/17/2014  . Chronic cerebral ischemia 01/17/2014  . Chronic obstructive pulmonary disease ([place]) 01/17/2014  . Congestive heart failure ([place]) 01/17/2014  . Cell phone elbow 01/17/2014  . Degeneration of intervertebral disc of lumbar region 01/17/2014  . Dermatophytic onychia 01/17/2014  . Dermatitis, eczematoid 01/17/2014  . Effusion into joint 01/17/2014  . Enthesopathy of hip 01/17/2014  . Asthma, exogenous 01/17/2014  . Adynamia 01/17/2014  . High potassium 01/17/2014  . HLD (hyperlipidemia) 01/17/2014  . Decreased potassium in the blood 01/17/2014  . Anemia, iron deficiency 01/17/2014  . Ache in joint 01/17/2014  . LBP (low back pain) 01/17/2014  . Edema leg 01/17/2014  . Lumbar canal stenosis 01/17/2014  . Morbid obesity ([place]) 01/17/2014  . Breathlessness lying flat 01/17/2014  . Arthritis of knee, degenerative 01/17/2014  . Arthralgia of hand 01/17/2014  . Arthralgia of hip or thigh 01/17/2014  . Arthralgia of shoulder 01/17/2014  . Peripheral vascular disease ([place]) 01/17/2014  . PNA (pneumonia) 01/17/2014  . Chronic restrictive lung disease 01/17/2014  . Breath shortness 01/17/2014  . Acute diastolic heart failure ([place]) 06/21/2012  . Anxiety 06/21/2012  . Stroke ([place]) 06/21/2012  . GERD (gastroesophageal reflux disease) 06/21/2012  . Cerebrovascular accident (CVA) ([place]) 06/21/2012  . Degenerative spondylolisthesis 06/20/2012  . Diabetes mellitus ([place]) 06/20/2012  . HTN (hypertension) 06/20/2012  . Atrial fibrillation with rapid ventricular response ([place]) 06/20/2012   Current Outpatient Medications on File Prior to Visit  Medication Sig Dispense Refill  . albuterol (PROAIR HFA) 108 (  90 BASE) MCG/ACT inhaler Inhale 2 puffs into the lungs every 4 (four) hours as needed for wheezing or shortness of  breath.     Marland Kitchen ammonium lactate (AMLACTIN) 12 % cream Apply topically 3 (three) times daily as needed (spots on arm). Take as directed.    . cloNIDine (CATAPRES) 0.1 MG tablet Take 1 tablet (0.1 mg total) by mouth 2 (two) times daily. 60 tablet 0  . digoxin (LANOXIN) 0.125 MG tablet Take 0.125 mg by mouth daily.     . fluticasone (FLONASE) 50 MCG/ACT nasal spray Place 2 sprays into both nostrils daily.     . furosemide (LASIX) 40 MG tablet Take 40 mg by mouth daily.    Marland Kitchen HYDROcodone-acetaminophen (NORCO/VICODIN) 5-325 MG per tablet Take 1 tablet by mouth every 4 (four) hours as needed (pain).     . Insulin Glargine (LANTUS SOLOSTAR) 100 UNIT/ML Solostar Pen Inject 25 Units into the skin daily at 10 pm.    . labetalol (NORMODYNE) 200 MG tablet Take 100 mg by mouth 2 (two) times daily.     Marland Kitchen loratadine (CLARITIN) 10 MG tablet Take 10 mg by mouth daily.    Marland Kitchen losartan (COZAAR) 100 MG tablet Take 100 mg by mouth daily.     . mesalamine (LIALDA) 1.2 G EC tablet Take 1.2 g by mouth daily with breakfast.     . montelukast (SINGULAIR) 10 MG tablet Take 10 mg by mouth at bedtime.     Marland Kitchen omega-3 acid ethyl esters (LOVAZA) 1 g capsule Take 1 g by mouth 2 (two) times daily.    Marland Kitchen omeprazole (PRILOSEC) 40 MG capsule Take 40 mg by mouth daily.     . potassium chloride SA (KLOR-CON M20) 20 MEQ tablet Take 20 mEq by mouth 3 (three) times daily.     . pregabalin (LYRICA) 100 MG capsule Take 100 mg by mouth See admin instructions. Take 1 capsule (100 mg) by mouth every morning, noon and 5pm (take 200 mg capsule at bedtime)    . pregabalin (LYRICA) 200 MG capsule Take 200 mg by mouth at bedtime.    . rivaroxaban (XARELTO) 20 MG TABS tablet Take 20 mg by mouth daily with supper.     . simvastatin (ZOCOR) 40 MG tablet Take 20 mg by mouth at bedtime.     . sitaGLIPtin-metformin (JANUMET) 50-500 MG per tablet Take 1 tablet by mouth 2 (two) times daily with a meal.     . spironolactone (ALDACTONE) 25 MG tablet Take 12.5 mg  by mouth daily.      No current facility-administered medications on file prior to visit.    Allergies  Allergen Reactions  . Altace [Ramipril] Shortness Of Breath and Rash  . Bee Venom Swelling    Mouth swelling - treated with benadryl  . Hydromorphone Anaphylaxis  . Fentanyl Other (See Comments)    Personality change- cried a lot (reaction to combination of versed and fentanyl)  . Versed [Midazolam] Other (See Comments)    Change in personality (reaction to combination of fentanyl and versed)  . Canagliflozin-Metformin Hcl Other (See Comments)    dehydration  . Exenatide Other (See Comments)    Leg problems  . Nsaids Other (See Comments)    Pt cannot take with xarelto  . Pneumococcal Vaccine Swelling    Swelling at injection site  . Sulfa Antibiotics Rash    No results found for this or any previous visit (from the past 2160 hour(s)).  Objective: General: Patient is awake,  alert, and oriented x 3 and in no acute distress. Ambulates with bilateral braces.  Integument: Skin is warm, dry and supple bilateral. Nails are tender, long, thickened and  dystrophic with subungual debris, consistent with onychomycosis, 1-5 bilateral. No signs of infection. No open lesions or preulcerative lesions present bilateral. Minimal keratosis to medial aspect of left first metatarsophalangeal joint with no signs of infection. Remaining integument unremarkable.  Vasculature:  Dorsalis Pedis pulse 1/4 bilateral. Posterior Tibial pulse  1/4 bilateral.  Capillary fill time <3 sec 1-5 bilateral. Scant hair growth to the level of the digits. Temperature gradient within normal limits. No varicosities present bilateral. No edema present bilateral.   Neurology: The patient has absent sensation measured with a 5.07/10g Semmes Weinstein Monofilament at all pedal sites bilateral . Vibratory sensation absent bilateral with tuning fork. No Babinski sign present bilateral.   Musculoskeletal: Planus and digital  contracture secondary to dropfoot noted bilateral after spinal surgery. Muscular strength 4/5 in all lower extremity muscular groups bilateral without pain on range of motion . No tenderness with calf compression bilateral.  Assessment and Plan: Problem List Items Addressed This Visit    None    Visit Diagnoses    Pain due to onychomycosis of toenail    -  Primary   Diabetic polyneuropathy associated with type 2 diabetes mellitus (HCC)       Foot drop, bilateral         -Examined patient. -Discussed and educated patient on diabetic foot care, especially with  regards to the vascular, neurological and musculoskeletal systems.  -Stressed the importance of good glycemic control and the detriment of not  controlling glucose levels in relation to the foot. -Mechanically debrided all nails 1-5 bilateral using sterile nail nipper and filed with dremel without incident  -Recommend continue with bilateral braces for foot drop, good supportive shoes, and rolling walker for assistance with stability in gait.  -Answered all patient questions -Patient to return in 3 months for at risk foot care -Patient advised to call the office if any problems or questions arise in the meantime.  Landis Martins, DPM

## 2017-10-14 ENCOUNTER — Ambulatory Visit: Payer: Medicare Other | Admitting: Sports Medicine

## 2017-12-16 ENCOUNTER — Ambulatory Visit (INDEPENDENT_AMBULATORY_CARE_PROVIDER_SITE_OTHER): Payer: Medicare Other | Admitting: Sports Medicine

## 2017-12-16 ENCOUNTER — Encounter: Payer: Self-pay | Admitting: Sports Medicine

## 2017-12-16 VITALS — BP 183/101 | HR 87 | Temp 98.7°F | Resp 16

## 2017-12-16 DIAGNOSIS — E1142 Type 2 diabetes mellitus with diabetic polyneuropathy: Secondary | ICD-10-CM

## 2017-12-16 DIAGNOSIS — M21371 Foot drop, right foot: Secondary | ICD-10-CM

## 2017-12-16 DIAGNOSIS — B351 Tinea unguium: Secondary | ICD-10-CM

## 2017-12-16 DIAGNOSIS — M79676 Pain in unspecified toe(s): Secondary | ICD-10-CM | POA: Diagnosis not present

## 2017-12-16 DIAGNOSIS — M21372 Foot drop, left foot: Secondary | ICD-10-CM

## 2017-12-16 NOTE — Progress Notes (Signed)
Patient ID: Phillip Chandler, male   DOB: 09-16-1930, 84 y.o.   MRN: [id number] Subjective: Phillip Chandler is a 84 y.o. male patient with history of diabetes who presents to office today complaining of long, painful nails  while ambulating in shoes; unable to trim. Patient states that the glucose reading this morning was 130. Patient denies any new changes in medication or new problems besides his wife passed away on 09-16-2022. No other pedal complaints.  Patient Active Problem List   Diagnosis Date Noted  . Spinal stenosis in cervical region 01/27/2016  . Spinal stenosis, thoracic 01/27/2016  . Chronic anticoagulation   . Weakness of left leg 01/25/2016  . Accelerated hypertension 01/25/2016  . Undiagnosed cardiac murmurs 01/25/2016  . CVA (cerebral infarction) 01/25/2016  . Acid reflux 06/04/2015  . Airway hyperreactivity 06/04/2015  . Atrial fibrillation ([place]) 06/04/2015  . Ulcerative colitis ([place]) 06/04/2015  . Diastolic heart failure ([place]) 06/04/2015  . BP (high blood pressure) 06/04/2015  . Pure hypercholesterolemia 06/04/2015  . Cellulitis of extremity 09/12/2014  . Encounter for surgical follow-up care 08/22/2014  . Pain in shoulder 08/02/2014  . Pain in the wrist 08/02/2014  . Encounter for other preprocedural examination 08/02/2014  . Carpal tunnel syndrome 05/30/2014  . Entrapment of ulnar nerve 05/30/2014  . Nerve root pain 01/25/2014  . Absolute anemia 01/18/2014  . Brachial plexus injury 01/18/2014  . Rupture long head biceps tendon 01/18/2014  . Degenerative arthritis of spine 01/18/2014  . Ecchymoses, spontaneous 01/18/2014  . Synovitis or tenosynovitis of wrist 01/18/2014  . Thoracic and lumbosacral neuritis 01/18/2014  . Left sided ulcerative colitis ([place]) 01/18/2014  . Neuropathic ulnar nerve 01/18/2014  . Umbilical hernia without obstruction and without gangrene 01/18/2014  . Hernia of anterior abdominal wall 01/18/2014  . Asthmatic breathing 01/18/2014  .  Allergic rhinitis 01/17/2014  . Benign essential HTN 01/17/2014  . Callosity 01/17/2014  . Carotid artery narrowing 01/17/2014  . Chronic cerebral ischemia 01/17/2014  . Chronic obstructive pulmonary disease ([place]) 01/17/2014  . Congestive heart failure ([place]) 01/17/2014  . Cell phone elbow 01/17/2014  . Degeneration of intervertebral disc of lumbar region 01/17/2014  . Dermatophytic onychia 01/17/2014  . Dermatitis, eczematoid 01/17/2014  . Effusion into joint 01/17/2014  . Enthesopathy of hip 01/17/2014  . Asthma, exogenous 01/17/2014  . Adynamia 01/17/2014  . High potassium 01/17/2014  . HLD (hyperlipidemia) 01/17/2014  . Decreased potassium in the blood 01/17/2014  . Anemia, iron deficiency 01/17/2014  . Ache in joint 01/17/2014  . LBP (low back pain) 01/17/2014  . Edema leg 01/17/2014  . Lumbar canal stenosis 01/17/2014  . Morbid obesity ([place]) 01/17/2014  . Breathlessness lying flat 01/17/2014  . Arthritis of knee, degenerative 01/17/2014  . Arthralgia of hand 01/17/2014  . Arthralgia of hip or thigh 01/17/2014  . Arthralgia of shoulder 01/17/2014  . Peripheral vascular disease ([place]) 01/17/2014  . PNA (pneumonia) 01/17/2014  . Chronic restrictive lung disease 01/17/2014  . Breath shortness 01/17/2014  . Acute diastolic heart failure ([place]) 06/21/2012  . Anxiety 06/21/2012  . Stroke ([place]) 06/21/2012  . GERD (gastroesophageal reflux disease) 06/21/2012  . Cerebrovascular accident (CVA) ([place]) 06/21/2012  . Degenerative spondylolisthesis 06/20/2012  . Diabetes mellitus ([place]) 06/20/2012  . HTN (hypertension) 06/20/2012  . Atrial fibrillation with rapid ventricular response ([place]) 06/20/2012   Current Outpatient Medications on File Prior to Visit  Medication Sig Dispense Refill  . albuterol (PROAIR HFA) 108 (90 BASE) MCG/ACT inhaler Inhale 2 puffs into the lungs every  4 (four) hours as needed for wheezing or shortness of breath.     Marland Kitchen ammonium lactate (AMLACTIN) 12 % cream  Apply topically 3 (three) times daily as needed (spots on arm). Take as directed.    . cloNIDine (CATAPRES) 0.1 MG tablet Take 1 tablet (0.1 mg total) by mouth 2 (two) times daily. 60 tablet 0  . digoxin (LANOXIN) 0.125 MG tablet Take 0.125 mg by mouth daily.     . fluticasone (FLONASE) 50 MCG/ACT nasal spray Place 2 sprays into both nostrils daily.     . furosemide (LASIX) 40 MG tablet Take 40 mg by mouth daily.    Marland Kitchen HYDROcodone-acetaminophen (NORCO/VICODIN) 5-325 MG per tablet Take 1 tablet by mouth every 4 (four) hours as needed (pain).     . Insulin Glargine (LANTUS SOLOSTAR) 100 UNIT/ML Solostar Pen Inject 25 Units into the skin daily at 10 pm.    . labetalol (NORMODYNE) 200 MG tablet Take 100 mg by mouth 2 (two) times daily.     Marland Kitchen loratadine (CLARITIN) 10 MG tablet Take 10 mg by mouth daily.    Marland Kitchen losartan (COZAAR) 100 MG tablet Take 100 mg by mouth daily.     . mesalamine (LIALDA) 1.2 G EC tablet Take 1.2 g by mouth daily with breakfast.     . montelukast (SINGULAIR) 10 MG tablet Take 10 mg by mouth at bedtime.     Marland Kitchen omega-3 acid ethyl esters (LOVAZA) 1 g capsule Take 1 g by mouth 2 (two) times daily.    Marland Kitchen omeprazole (PRILOSEC) 40 MG capsule Take 40 mg by mouth daily.     . potassium chloride SA (KLOR-CON M20) 20 MEQ tablet Take 20 mEq by mouth 3 (three) times daily.     . pregabalin (LYRICA) 100 MG capsule Take 100 mg by mouth See admin instructions. Take 1 capsule (100 mg) by mouth every morning, noon and 5pm (take 200 mg capsule at bedtime)    . pregabalin (LYRICA) 200 MG capsule Take 200 mg by mouth at bedtime.    . rivaroxaban (XARELTO) 20 MG TABS tablet Take 20 mg by mouth daily with supper.     . simvastatin (ZOCOR) 40 MG tablet Take 20 mg by mouth at bedtime.     . sitaGLIPtin-metformin (JANUMET) 50-500 MG per tablet Take 1 tablet by mouth 2 (two) times daily with a meal.     . spironolactone (ALDACTONE) 25 MG tablet Take 12.5 mg by mouth daily.      No current  facility-administered medications on file prior to visit.    Allergies  Allergen Reactions  . Altace [Ramipril] Shortness Of Breath and Rash  . Bee Venom Swelling    Mouth swelling - treated with benadryl  . Hydromorphone Anaphylaxis  . Fentanyl Other (See Comments)    Personality change- cried a lot (reaction to combination of versed and fentanyl)  . Versed [Midazolam] Other (See Comments)    Change in personality (reaction to combination of fentanyl and versed)  . Canagliflozin-Metformin Hcl Other (See Comments)    dehydration  . Exenatide Other (See Comments)    Leg problems  . Nsaids Other (See Comments)    Pt cannot take with xarelto  . Pneumococcal Vaccine Swelling    Swelling at injection site  . Sulfa Antibiotics Rash    No results found for this or any previous visit (from the past 2160 hour(s)).  Objective: General: Patient is awake, alert, and oriented x 3 and in no acute distress. Ambulates  with bilateral braces.  Integument: Skin is warm, dry and supple bilateral. Nails are tender, long, thickened and  dystrophic with subungual debris, consistent with onychomycosis, 1-5 bilateral. No signs of infection. No open lesions or preulcerative lesions present bilateral. Minimal keratosis to medial aspect of left first metatarsophalangeal joint with no signs of infection. Remaining integument unremarkable.  Vasculature:  Dorsalis Pedis pulse 1/4 bilateral. Posterior Tibial pulse  1/4 bilateral.  Capillary fill time <3 sec 1-5 bilateral. Scant hair growth to the level of the digits. Temperature gradient within normal limits. No varicosities present bilateral. No edema present bilateral.   Neurology: The patient has absent sensation measured with a 5.07/10g Semmes Weinstein Monofilament at all pedal sites bilateral . Vibratory sensation absent bilateral with tuning fork. No Babinski sign present bilateral.   Musculoskeletal: Planus and digital contracture secondary to dropfoot  noted bilateral after spinal surgery. Muscular strength 4/5 in all lower extremity muscular groups bilateral without pain on range of motion . No tenderness with calf compression bilateral.  Assessment and Plan: Problem List Items Addressed This Visit    None    Visit Diagnoses    Pain due to onychomycosis of toenail    -  Primary   Diabetic polyneuropathy associated with type 2 diabetes mellitus (HCC)       Foot drop, bilateral         -Examined patient. -Discussed and educated patient on diabetic foot care, especially with  regards to the vascular, neurological and musculoskeletal systems.  -Stressed the importance of good glycemic control and the detriment of not  controlling glucose levels in relation to the foot. -Mechanically debrided all nails 1-5 bilateral using sterile nail nipper and filed with dremel without incident  -Recommend continue with bilateral braces for foot drop, good supportive shoes, and rolling walker for assistance with stability in gait.  -Answered all patient questions -Patient to return in 3 months for at risk foot care -Patient advised to call the office if any problems or questions arise in the meantime.  Landis Martins, DPM

## 2017-12-24 IMAGING — MR MR MRA HEAD W/O CM
9 of 11 series · 29 of 48 positions shown · non-contrast
Comparison: None.

CLINICAL DATA: Acute onset LEFT lower extremity weakness and
sensory deficits. History of chronic lower extremity weakness
secondary to benign spinal tumor. History of hypertension, stroke,
diabetes.

EXAM:
MRI HEAD WITHOUT CONTRAST
MRA HEAD WITHOUT CONTRAST
TECHNIQUE: Multiplanar, multiecho pulse sequences of the brain and surrounding
structures were obtained without intravenous contrast. Angiographic
images of the head were obtained using MRA technique without
contrast.

[Series 2: FLAIR · sagittal · 5.0mm · 0.47mm/px · 1 of 27 slices shown (1 of 2)]
[im 1/27]
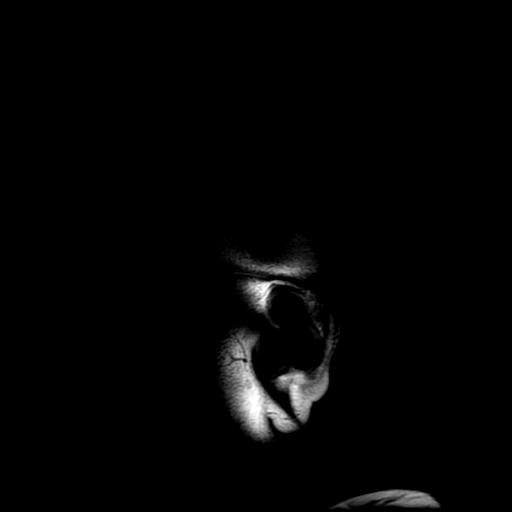

[Series 4: DWI · axial · 3.0mm · 0.94mm/px · z∈[-52,+103]mm · 7 of 106 slices shown (1 of 2)]
[im 1/106]
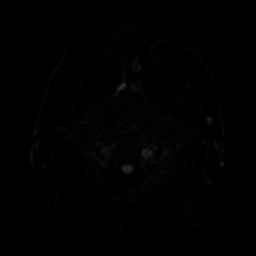
[im 18/106]
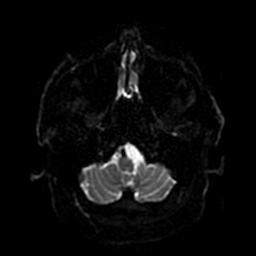
[im 36/106]
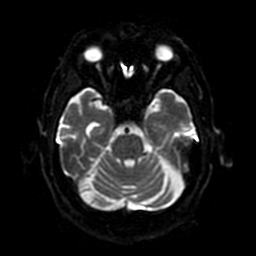
[im 53/106]
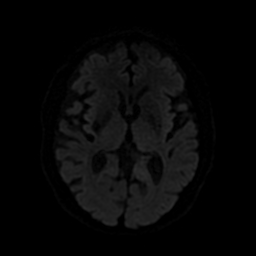
[im 71/106]
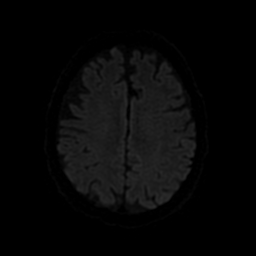
[im 88/106]
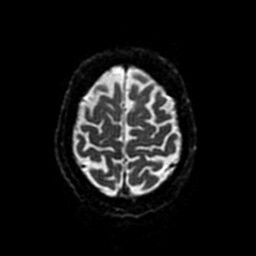
[im 106/106]
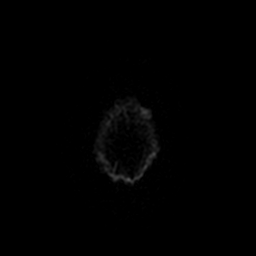

[Series 5: DWI · coronal · 4.0mm · 0.98mm/px · 4 of 70 slices shown (2 of 2)]
[im 1/70]
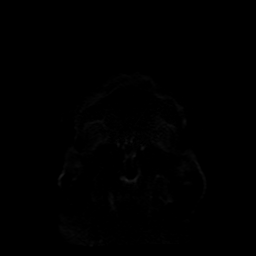
[im 24/70]
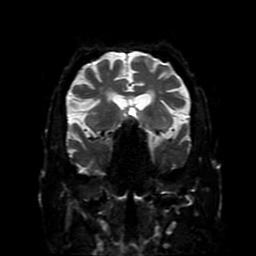
[im 47/70]
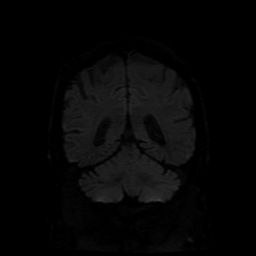
[im 70/70]
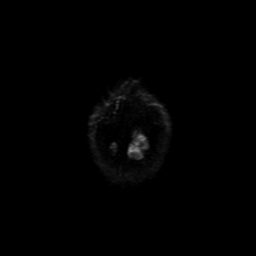

[Series 6: T2 · axial · 5.0mm · 0.47mm/px · z∈[-52,+103]mm · 2 of 27 slices shown (1 of 2)]
[im 1/27]
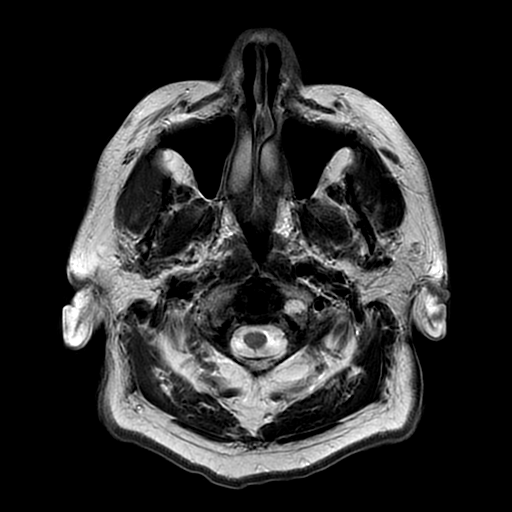
[im 27/27]
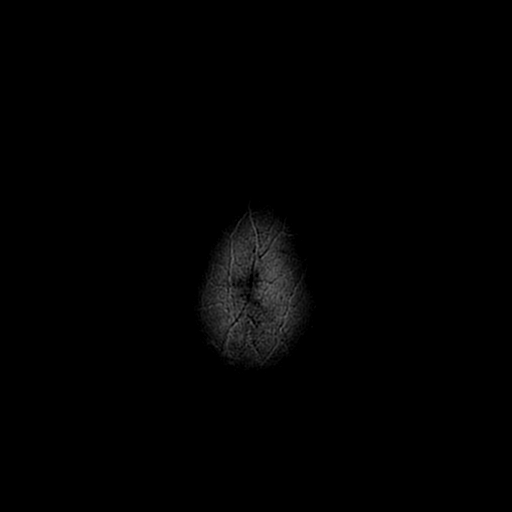

[Series 7: ax (id) 2 · axial · 1.0mm · 0.43mm/px · z∈[-95,-13]mm · 6 of 236 slices shown]
[im 1/236]
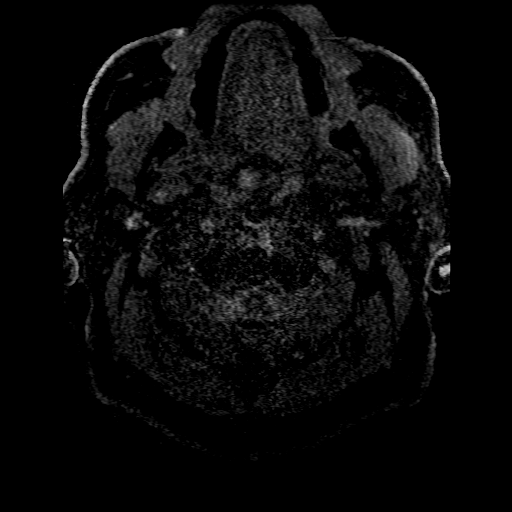
[im 34/236]
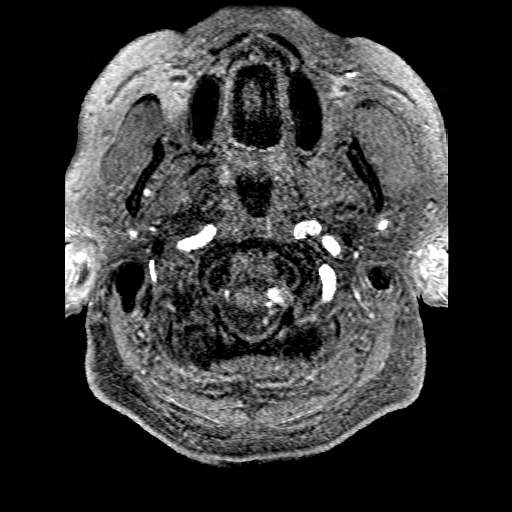
[im 68/236]
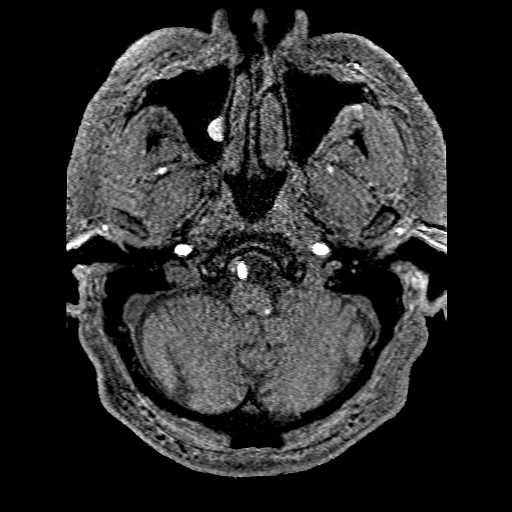
[im 101/236]
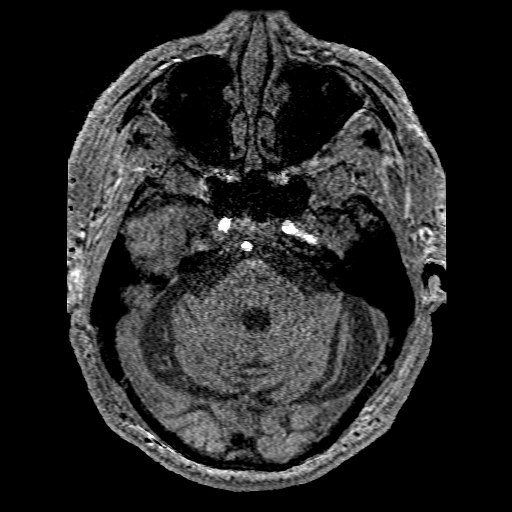
[im 135/236]
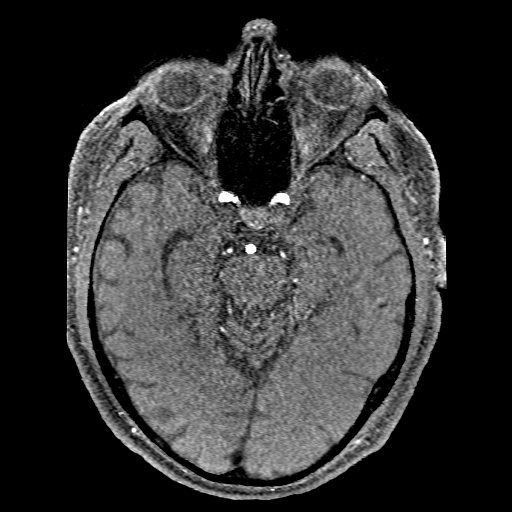
[im 168/236]
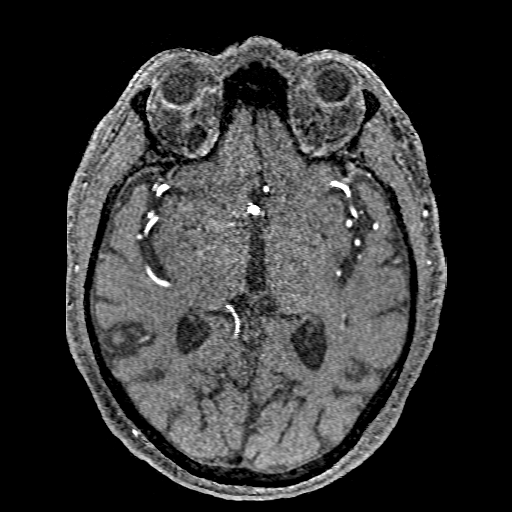

[Series 8: FLAIR · axial · 5.0mm · 0.47mm/px · z∈[-52,+103]mm · 2 of 27 slices shown (2 of 2)]
[im 1/27]
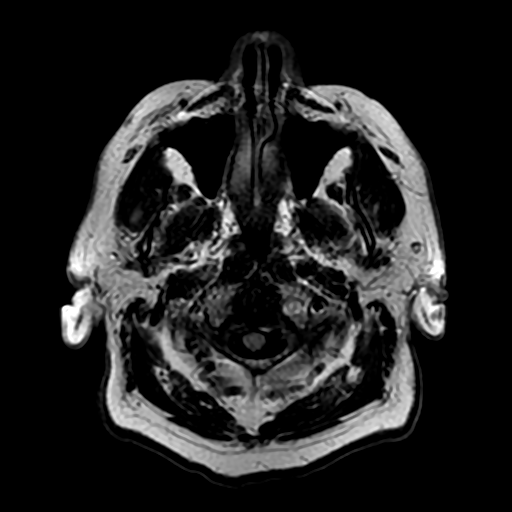
[im 27/27]
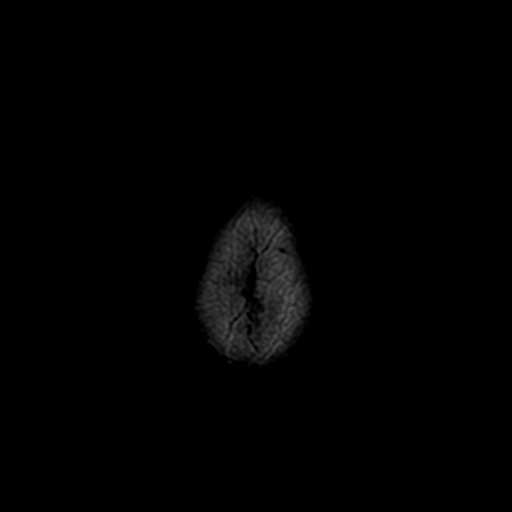

[Series 11: T2 · coronal · 5.0mm · 0.39mm/px · 2 of 29 slices shown (2 of 2)]
[im 1/29]
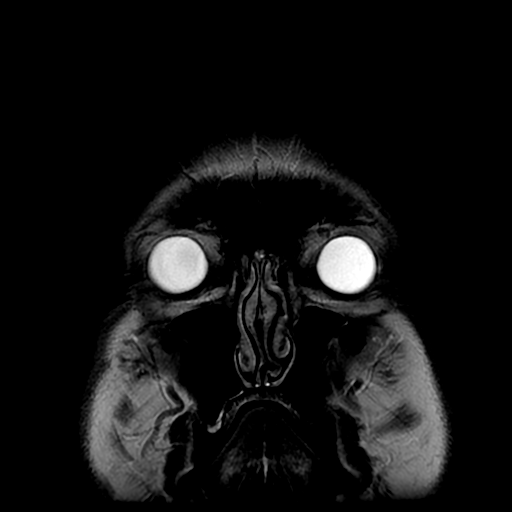
[im 29/29]
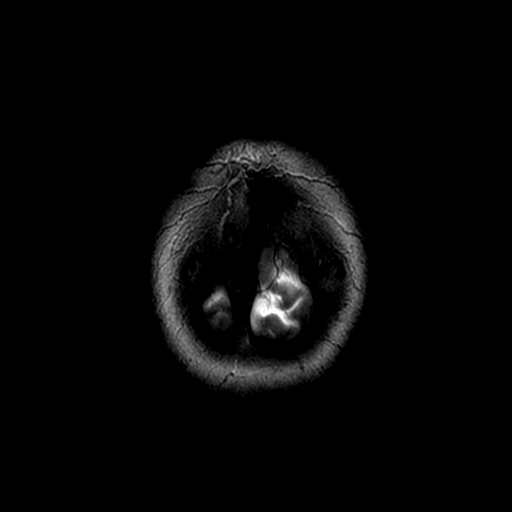

[Series 450: ADC · axial · 3.0mm · 0.94mm/px · z∈[-52,+103]mm · 3 of 52 slices shown (1 of 2)]
[im 1/52]
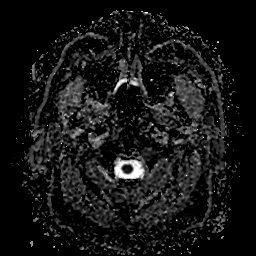
[im 26/52]
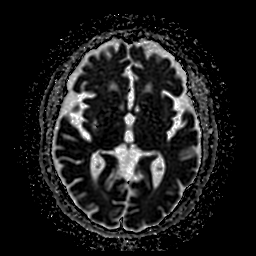
[im 52/52]
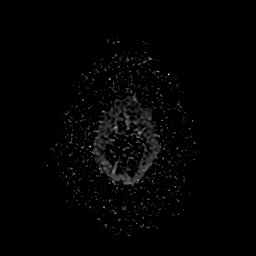

[Series 550: ADC · coronal · 4.0mm · 0.98mm/px · 2 of 35 slices shown (2 of 2)]
[im 1/35]
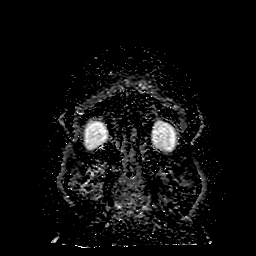
[im 35/35]
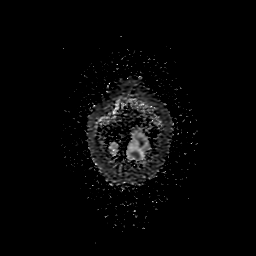

[29 of 48 positions shown; findings below may reference images not displayed]

FINDINGS: MRI HEAD FINDINGS

INTRACRANIAL CONTENTS: No reduced diffusion to suggest acute
ischemia. Scattered small foci of susceptibility artifact scattered
throughout infra and supratentorial brain including deep gray
nuclei. Susceptibility artifact associated with old RIGHT basal
ganglia infarct. Ventricles and sulci are normal for patient's age.
Patchy supratentorial and pontine white matter FLAIR T2
hyperintensities. No midline shift, mass effect or mass lesions. No
abnormal extra-axial fluid collections. Minimal susceptibility
artifact within posterior fossa extra-axial space, suggesting old
subarachnoid hemorrhage or possibly from prior spine surgery.

ORBITS: The included ocular globes and orbital contents are
non-suspicious. Status post bilateral ocular lens implants.

SINUSES: Small RIGHT mucosal retention cyst. Mastoid air cells are
well aerated.

SKULL/SOFT TISSUES: No abnormal sellar expansion. No suspicious
calvarial bone marrow signal. Craniocervical junction maintained.
Patient is edentulous.

MRA HEAD FINDINGS:

ANTERIOR CIRCULATION: Tortuous included cervical internal carotid
arteries, associated with chronic hypertension. Flow related
enhancement of the bilateral internal carotid arteries through the
level of the carotid terminus. Symmetric flow related enhancement of
the ophthalmic arteries. RIGHT knee A2 segment origin infundibulum
without discrete aneurysm. Patent bilateral anterior cerebral
arteries. Flow related enhancement of bilateral middle cerebral
arteries.

No large vessel occlusion, high-grade stenosis, abnormal luminal
irregularity, aneurysm. Mild dolichoectasia.

POSTERIOR CIRCULATION: LEFT vertebral artery is dominant. Basilar
artery is patent, with normal flow related enhancement of the main
branch vessels. Mild stenosis RIGHT P1 segment. Flow related
enhancement of bilateral posterior cerebral arteries.

No large vessel occlusion, high-grade stenosis, abnormal luminal
irregularity, aneurysm. Mild dolichoectasia.
IMPRESSION: MRI HEAD: No acute intracranial process, no acute ischemia.

Old RIGHT basal ganglia hemorrhagic infarct. Scattered
susceptibility artifact compatible chronic hypertension.

Involutional changes and moderate chronic small vessel ischemic
disease.

MRA HEAD: No emergent large vessel occlusion or severe stenosis.

Dolichoectasia associated with chronic hypertension.

## 2018-02-21 ENCOUNTER — Ambulatory Visit: Payer: Medicare Other | Admitting: Podiatry

## 2018-02-23 ENCOUNTER — Ambulatory Visit (INDEPENDENT_AMBULATORY_CARE_PROVIDER_SITE_OTHER): Payer: Medicare Other | Admitting: Sports Medicine

## 2018-02-23 ENCOUNTER — Encounter: Payer: Self-pay | Admitting: Sports Medicine

## 2018-02-23 DIAGNOSIS — M21371 Foot drop, right foot: Secondary | ICD-10-CM | POA: Diagnosis not present

## 2018-02-23 DIAGNOSIS — M21372 Foot drop, left foot: Secondary | ICD-10-CM

## 2018-02-23 DIAGNOSIS — E1142 Type 2 diabetes mellitus with diabetic polyneuropathy: Secondary | ICD-10-CM | POA: Diagnosis not present

## 2018-02-23 DIAGNOSIS — L84 Corns and callosities: Secondary | ICD-10-CM

## 2018-02-23 NOTE — Progress Notes (Addendum)
Patient ID: Phillip Chandler, male   DOB: 1930/12/28, 84 y.o.   MRN: [id number] Subjective: Phillip Chandler is a 84 y.o. male patient with history of diabetes who presents to office today complaining of long, painful nails  while ambulating in shoes; unable to trim. Patient states that the glucose reading this morning was 130. Patient denies any new changes in medication or new problems besides his wife passed away on September 08, 2022. No other pedal complaints.  Patient Active Problem List   Diagnosis Date Noted  . Spinal stenosis in cervical region 01/27/2016  . Spinal stenosis, thoracic 01/27/2016  . Chronic anticoagulation   . Weakness of left leg 01/25/2016  . Accelerated hypertension 01/25/2016  . Undiagnosed cardiac murmurs 01/25/2016  . CVA (cerebral infarction) 01/25/2016  . Acid reflux 06/04/2015  . Airway hyperreactivity 06/04/2015  . Atrial fibrillation ([place]) 06/04/2015  . Ulcerative colitis ([place]) 06/04/2015  . Diastolic heart failure ([place]) 06/04/2015  . BP (high blood pressure) 06/04/2015  . Pure hypercholesterolemia 06/04/2015  . Cellulitis of extremity 09/12/2014  . Encounter for surgical follow-up care 08/22/2014  . Pain in shoulder 08/02/2014  . Pain in the wrist 08/02/2014  . Encounter for other preprocedural examination 08/02/2014  . Carpal tunnel syndrome 05/30/2014  . Entrapment of ulnar nerve 05/30/2014  . Nerve root pain 01/25/2014  . Absolute anemia 01/18/2014  . Brachial plexus injury 01/18/2014  . Rupture long head biceps tendon 01/18/2014  . Degenerative arthritis of spine 01/18/2014  . Ecchymoses, spontaneous 01/18/2014  . Synovitis or tenosynovitis of wrist 01/18/2014  . Thoracic and lumbosacral neuritis 01/18/2014  . Left sided ulcerative colitis ([place]) 01/18/2014  . Neuropathic ulnar nerve 01/18/2014  . Umbilical hernia without obstruction and without gangrene 01/18/2014  . Hernia of anterior abdominal wall 01/18/2014  . Asthmatic breathing 01/18/2014  .  Allergic rhinitis 01/17/2014  . Benign essential HTN 01/17/2014  . Callosity 01/17/2014  . Carotid artery narrowing 01/17/2014  . Chronic cerebral ischemia 01/17/2014  . Chronic obstructive pulmonary disease ([place]) 01/17/2014  . Congestive heart failure ([place]) 01/17/2014  . Cell phone elbow 01/17/2014  . Degeneration of intervertebral disc of lumbar region 01/17/2014  . Dermatophytic onychia 01/17/2014  . Dermatitis, eczematoid 01/17/2014  . Effusion into joint 01/17/2014  . Enthesopathy of hip 01/17/2014  . Asthma, exogenous 01/17/2014  . Adynamia 01/17/2014  . High potassium 01/17/2014  . HLD (hyperlipidemia) 01/17/2014  . Decreased potassium in the blood 01/17/2014  . Anemia, iron deficiency 01/17/2014  . Ache in joint 01/17/2014  . LBP (low back pain) 01/17/2014  . Edema leg 01/17/2014  . Lumbar canal stenosis 01/17/2014  . Morbid obesity ([place]) 01/17/2014  . Breathlessness lying flat 01/17/2014  . Arthritis of knee, degenerative 01/17/2014  . Arthralgia of hand 01/17/2014  . Arthralgia of hip or thigh 01/17/2014  . Arthralgia of shoulder 01/17/2014  . Peripheral vascular disease ([place]) 01/17/2014  . PNA (pneumonia) 01/17/2014  . Chronic restrictive lung disease 01/17/2014  . Breath shortness 01/17/2014  . Acute diastolic heart failure ([place]) 06/21/2012  . Anxiety 06/21/2012  . Stroke ([place]) 06/21/2012  . GERD (gastroesophageal reflux disease) 06/21/2012  . Cerebrovascular accident (CVA) ([place]) 06/21/2012  . Degenerative spondylolisthesis 06/20/2012  . Diabetes mellitus ([place]) 06/20/2012  . HTN (hypertension) 06/20/2012  . Atrial fibrillation with rapid ventricular response ([place]) 06/20/2012   Current Outpatient Medications on File Prior to Visit  Medication Sig Dispense Refill  . albuterol (PROAIR HFA) 108 (90 BASE) MCG/ACT inhaler Inhale 2 puffs into the lungs every  4 (four) hours as needed for wheezing or shortness of breath.     Marland Kitchen ammonium lactate (AMLACTIN) 12 % cream  Apply topically 3 (three) times daily as needed (spots on arm). Take as directed.    . cloNIDine (CATAPRES) 0.1 MG tablet Take 1 tablet (0.1 mg total) by mouth 2 (two) times daily. 60 tablet 0  . digoxin (LANOXIN) 0.125 MG tablet Take 0.125 mg by mouth daily.     . fluticasone (FLONASE) 50 MCG/ACT nasal spray Place 2 sprays into both nostrils daily.     . furosemide (LASIX) 40 MG tablet Take 40 mg by mouth daily.    Marland Kitchen HYDROcodone-acetaminophen (NORCO/VICODIN) 5-325 MG per tablet Take 1 tablet by mouth every 4 (four) hours as needed (pain).     . Insulin Glargine (LANTUS SOLOSTAR) 100 UNIT/ML Solostar Pen Inject 25 Units into the skin daily at 10 pm.    . labetalol (NORMODYNE) 200 MG tablet Take 100 mg by mouth 2 (two) times daily.     Marland Kitchen loratadine (CLARITIN) 10 MG tablet Take 10 mg by mouth daily.    Marland Kitchen losartan (COZAAR) 100 MG tablet Take 100 mg by mouth daily.     . mesalamine (LIALDA) 1.2 G EC tablet Take 1.2 g by mouth daily with breakfast.     . montelukast (SINGULAIR) 10 MG tablet Take 10 mg by mouth at bedtime.     Marland Kitchen omega-3 acid ethyl esters (LOVAZA) 1 g capsule Take 1 g by mouth 2 (two) times daily.    Marland Kitchen omeprazole (PRILOSEC) 40 MG capsule Take 40 mg by mouth daily.     . potassium chloride SA (KLOR-CON M20) 20 MEQ tablet Take 20 mEq by mouth 3 (three) times daily.     . pregabalin (LYRICA) 100 MG capsule Take 100 mg by mouth See admin instructions. Take 1 capsule (100 mg) by mouth every morning, noon and 5pm (take 200 mg capsule at bedtime)    . pregabalin (LYRICA) 200 MG capsule Take 200 mg by mouth at bedtime.    . rivaroxaban (XARELTO) 20 MG TABS tablet Take 20 mg by mouth daily with supper.     . simvastatin (ZOCOR) 40 MG tablet Take 20 mg by mouth at bedtime.     . sitaGLIPtin-metformin (JANUMET) 50-500 MG per tablet Take 1 tablet by mouth 2 (two) times daily with a meal.     . spironolactone (ALDACTONE) 25 MG tablet Take 12.5 mg by mouth daily.      No current  facility-administered medications on file prior to visit.    Allergies  Allergen Reactions  . Altace [Ramipril] Shortness Of Breath and Rash  . Bee Venom Swelling    Mouth swelling - treated with benadryl  . Hydromorphone Anaphylaxis  . Fentanyl Other (See Comments)    Personality change- cried a lot (reaction to combination of versed and fentanyl)  . Versed [Midazolam] Other (See Comments)    Change in personality (reaction to combination of fentanyl and versed)  . Canagliflozin-Metformin Hcl Other (See Comments)    dehydration  . Exenatide Other (See Comments)    Leg problems  . Nsaids Other (See Comments)    Pt cannot take with xarelto  . Pneumococcal Vaccine Swelling    Swelling at injection site  . Sulfa Antibiotics Rash    No results found for this or any previous visit (from the past 2160 hour(s)).  Objective: General: Patient is awake, alert, and oriented x 3 and in no acute distress. Ambulates  with bilateral braces.  Integument: Skin is warm, dry and supple bilateral. Nails are short and thickened consistent with onychomycosis, 1-5 bilateral. No signs of infection. No open lesions or preulcerative lesions present bilateral. Minimal keratosis and dry blood to medial aspect of left hallux/first metatarsophalangeal joint with no signs of infection. Remaining integument unremarkable.  Vasculature:  Dorsalis Pedis pulse 1/4 bilateral. Posterior Tibial pulse  1/4 bilateral.  Capillary fill time <3 sec 1-5 bilateral. Scant hair growth to the level of the digits. Temperature gradient within normal limits. No varicosities present bilateral. No edema present bilateral.   Neurology: The patient has absent sensation measured with a 5.07/10g Semmes Weinstein Monofilament at all pedal sites bilateral . Vibratory sensation absent bilateral with tuning fork. No Babinski sign present bilateral.   Musculoskeletal: Planus and digital contracture secondary to dropfoot noted bilateral after  spinal surgery. Muscular strength 4/5 in all lower extremity muscular groups bilateral without pain on range of motion . No tenderness with calf compression bilateral.  Assessment and Plan: Problem List Items Addressed This Visit    None    Visit Diagnoses    Pre-ulcerative calluses    -  Primary   Diabetic polyneuropathy associated with type 2 diabetes mellitus (HCC)       Foot drop, bilateral         -Examined patient. -Discussed and educated patient on diabetic foot care, especially with  regards to the vascular, neurological and musculoskeletal systems.  -At no charge mechanically debrided callus at the left medial great toe using a sterile chisel blade and advised patient to get new shoes and to continue with cushioning or padding to prevent rubbing since this is likely a friction callus that the skin gapped open and started to bleed but now is healed. -Patient advised to call the office if any problems or questions arise in the meantime. -Patient to return in 2 months for regular diabetic nail and callus care  Landis Martins, DPM

## 2018-03-17 ENCOUNTER — Ambulatory Visit: Payer: Medicare Other | Admitting: Sports Medicine

## 2018-04-27 ENCOUNTER — Ambulatory Visit (INDEPENDENT_AMBULATORY_CARE_PROVIDER_SITE_OTHER): Payer: Medicare Other | Admitting: Sports Medicine

## 2018-04-27 ENCOUNTER — Encounter: Payer: Self-pay | Admitting: Sports Medicine

## 2018-04-27 VITALS — BP 198/112 | HR 70 | Resp 16

## 2018-04-27 DIAGNOSIS — B351 Tinea unguium: Secondary | ICD-10-CM | POA: Diagnosis not present

## 2018-04-27 DIAGNOSIS — R29898 Other symptoms and signs involving the musculoskeletal system: Secondary | ICD-10-CM

## 2018-04-27 DIAGNOSIS — M79676 Pain in unspecified toe(s): Secondary | ICD-10-CM

## 2018-04-27 DIAGNOSIS — M21372 Foot drop, left foot: Secondary | ICD-10-CM

## 2018-04-27 DIAGNOSIS — M21371 Foot drop, right foot: Secondary | ICD-10-CM

## 2018-04-27 DIAGNOSIS — E1142 Type 2 diabetes mellitus with diabetic polyneuropathy: Secondary | ICD-10-CM

## 2018-04-27 DIAGNOSIS — L84 Corns and callosities: Secondary | ICD-10-CM

## 2018-04-27 NOTE — Progress Notes (Signed)
Patient ID: Phillip Chandler, male   DOB: 1930-11-18, 84 y.o.   MRN: [id number] Subjective: Phillip Chandler is a 84 y.o. male patient with history of diabetes who presents to office today complaining of long, painful nails  while ambulating in shoes; unable to trim. Patient states that the glucose reading this morning was 198. Patient denies any new changes in medication or new problems besides recently being discharged from hospital on Friday due to heart condition and COPD. No other pedal complaints.  Patient Active Problem List   Diagnosis Date Noted  . Spinal stenosis in cervical region 01/27/2016  . Spinal stenosis, thoracic 01/27/2016  . Chronic anticoagulation   . Weakness of left leg 01/25/2016  . Accelerated hypertension 01/25/2016  . Undiagnosed cardiac murmurs 01/25/2016  . CVA (cerebral infarction) 01/25/2016  . Acid reflux 06/04/2015  . Airway hyperreactivity 06/04/2015  . Atrial fibrillation ([place]) 06/04/2015  . Ulcerative colitis ([place]) 06/04/2015  . Diastolic heart failure ([place]) 06/04/2015  . BP (high blood pressure) 06/04/2015  . Pure hypercholesterolemia 06/04/2015  . Cellulitis of extremity 09/12/2014  . Encounter for surgical follow-up care 08/22/2014  . Pain in shoulder 08/02/2014  . Pain in the wrist 08/02/2014  . Encounter for other preprocedural examination 08/02/2014  . Carpal tunnel syndrome 05/30/2014  . Entrapment of ulnar nerve 05/30/2014  . Nerve root pain 01/25/2014  . Absolute anemia 01/18/2014  . Brachial plexus injury 01/18/2014  . Rupture long head biceps tendon 01/18/2014  . Degenerative arthritis of spine 01/18/2014  . Ecchymoses, spontaneous 01/18/2014  . Synovitis or tenosynovitis of wrist 01/18/2014  . Thoracic and lumbosacral neuritis 01/18/2014  . Left sided ulcerative colitis ([place]) 01/18/2014  . Neuropathic ulnar nerve 01/18/2014  . Umbilical hernia without obstruction and without gangrene 01/18/2014  . Hernia of anterior abdominal wall  01/18/2014  . Asthmatic breathing 01/18/2014  . Allergic rhinitis 01/17/2014  . Benign essential HTN 01/17/2014  . Callosity 01/17/2014  . Carotid artery narrowing 01/17/2014  . Chronic cerebral ischemia 01/17/2014  . Chronic obstructive pulmonary disease ([place]) 01/17/2014  . Congestive heart failure ([place]) 01/17/2014  . Cell phone elbow 01/17/2014  . Degeneration of intervertebral disc of lumbar region 01/17/2014  . Dermatophytic onychia 01/17/2014  . Dermatitis, eczematoid 01/17/2014  . Effusion into joint 01/17/2014  . Enthesopathy of hip 01/17/2014  . Asthma, exogenous 01/17/2014  . Adynamia 01/17/2014  . High potassium 01/17/2014  . HLD (hyperlipidemia) 01/17/2014  . Decreased potassium in the blood 01/17/2014  . Anemia, iron deficiency 01/17/2014  . Ache in joint 01/17/2014  . LBP (low back pain) 01/17/2014  . Edema leg 01/17/2014  . Lumbar canal stenosis 01/17/2014  . Morbid obesity ([place]) 01/17/2014  . Breathlessness lying flat 01/17/2014  . Arthritis of knee, degenerative 01/17/2014  . Arthralgia of hand 01/17/2014  . Arthralgia of hip or thigh 01/17/2014  . Arthralgia of shoulder 01/17/2014  . Peripheral vascular disease ([place]) 01/17/2014  . PNA (pneumonia) 01/17/2014  . Chronic restrictive lung disease 01/17/2014  . Breath shortness 01/17/2014  . Acute diastolic heart failure ([place]) 06/21/2012  . Anxiety 06/21/2012  . Stroke ([place]) 06/21/2012  . GERD (gastroesophageal reflux disease) 06/21/2012  . Cerebrovascular accident (CVA) ([place]) 06/21/2012  . Degenerative spondylolisthesis 06/20/2012  . Diabetes mellitus ([place]) 06/20/2012  . HTN (hypertension) 06/20/2012  . Atrial fibrillation with rapid ventricular response ([place]) 06/20/2012   Current Outpatient Medications on File Prior to Visit  Medication Sig Dispense Refill  . albuterol (PROAIR HFA) 108 (90 BASE) MCG/ACT inhaler  Inhale 2 puffs into the lungs every 4 (four) hours as needed for wheezing or shortness of  breath.     Marland Kitchen ammonium lactate (AMLACTIN) 12 % cream Apply topically 3 (three) times daily as needed (spots on arm). Take as directed.    . cloNIDine (CATAPRES) 0.1 MG tablet Take 1 tablet (0.1 mg total) by mouth 2 (two) times daily. 60 tablet 0  . digoxin (LANOXIN) 0.125 MG tablet Take 0.125 mg by mouth daily.     . fluticasone (FLONASE) 50 MCG/ACT nasal spray Place 2 sprays into both nostrils daily.     . furosemide (LASIX) 40 MG tablet Take 40 mg by mouth daily.    Marland Kitchen HYDROcodone-acetaminophen (NORCO/VICODIN) 5-325 MG per tablet Take 1 tablet by mouth every 4 (four) hours as needed (pain).     . Insulin Glargine (LANTUS SOLOSTAR) 100 UNIT/ML Solostar Pen Inject 25 Units into the skin daily at 10 pm.    . labetalol (NORMODYNE) 200 MG tablet Take 100 mg by mouth 2 (two) times daily.     Marland Kitchen loratadine (CLARITIN) 10 MG tablet Take 10 mg by mouth daily.    Marland Kitchen losartan (COZAAR) 100 MG tablet Take 100 mg by mouth daily.     . mesalamine (LIALDA) 1.2 G EC tablet Take 1.2 g by mouth daily with breakfast.     . montelukast (SINGULAIR) 10 MG tablet Take 10 mg by mouth at bedtime.     Marland Kitchen omega-3 acid ethyl esters (LOVAZA) 1 g capsule Take 1 g by mouth 2 (two) times daily.    Marland Kitchen omeprazole (PRILOSEC) 40 MG capsule Take 40 mg by mouth daily.     . potassium chloride SA (KLOR-CON M20) 20 MEQ tablet Take 20 mEq by mouth 3 (three) times daily.     . predniSONE (STERAPRED UNI-PAK 48 TAB) 10 MG (48) TBPK tablet See admin instructions. see package  0  . pregabalin (LYRICA) 100 MG capsule Take 100 mg by mouth See admin instructions. Take 1 capsule (100 mg) by mouth every morning, noon and 5pm (take 200 mg capsule at bedtime)    . pregabalin (LYRICA) 200 MG capsule Take 200 mg by mouth at bedtime.    . rivaroxaban (XARELTO) 20 MG TABS tablet Take 20 mg by mouth daily with supper.     . simvastatin (ZOCOR) 40 MG tablet Take 20 mg by mouth at bedtime.     . sitaGLIPtin-metformin (JANUMET) 50-500 MG per tablet Take 1  tablet by mouth 2 (two) times daily with a meal.     . spironolactone (ALDACTONE) 25 MG tablet Take 12.5 mg by mouth daily.      No current facility-administered medications on file prior to visit.    Allergies  Allergen Reactions  . Altace [Ramipril] Shortness Of Breath and Rash  . Bee Venom Swelling    Mouth swelling - treated with benadryl  . Hydromorphone Anaphylaxis  . Pneumococcal Polysaccharide Vaccine Swelling  . Fentanyl Other (See Comments)    Personality change- cried a lot (reaction to combination of versed and fentanyl)  . Versed [Midazolam] Other (See Comments)    Change in personality (reaction to combination of fentanyl and versed)  . Canagliflozin-Metformin Hcl Other (See Comments)    dehydration  . Exenatide Other (See Comments)    Leg problems  . Nsaids Other (See Comments)    Pt cannot take with xarelto unknown   . Pneumococcal Vaccine Swelling    Swelling at injection site  . Sulfa Antibiotics Rash  and Other (See Comments)    unknown     No results found for this or any previous visit (from the past 2160 hour(s)).  Objective: General: Patient is awake, alert, and oriented x 3 and in no acute distress. Ambulates with bilateral braces.  Integument: Skin is warm, dry and supple bilateral. Nails are short and thickened consistent with onychomycosis, 1-5 bilateral. No signs of infection. No open lesions or preulcerative lesions present bilateral. Minimal keratosis and dry blood to medial aspect of left hallux/first metatarsophalangeal joint with no signs of infection. Remaining integument unremarkable.  Vasculature:  Dorsalis Pedis pulse 1/4 bilateral. Posterior Tibial pulse  1/4 bilateral.  Capillary fill time <3 sec 1-5 bilateral. Scant hair growth to the level of the digits. Temperature gradient within normal limits. No varicosities present bilateral. No edema present bilateral.   Neurology: The patient has absent sensation measured with a 5.07/10g Semmes  Weinstein Monofilament at all pedal sites bilateral . Vibratory sensation absent bilateral with tuning fork. No Babinski sign present bilateral.   Musculoskeletal: Planus and digital contracture secondary to dropfoot noted bilateral after spinal surgery. Muscular strength 4/5 in all lower extremity muscular groups bilateral without pain on range of motion . No tenderness with calf compression bilateral.  Assessment and Plan: Problem List Items Addressed This Visit      Nervous and Auditory   Weakness of left leg    Other Visit Diagnoses    Pain due to onychomycosis of toenail    -  Primary   Pre-ulcerative calluses       Diabetic polyneuropathy associated with type 2 diabetes mellitus (HCC)       Foot drop, bilateral          -Examined patient. -Discussed and educated patient on diabetic foot care, especially with  regards to the vascular, neurological and musculoskeletal systems.  -At no charge mechanically debrided callus at the left medial great toe using a sterile chisel blade -Debrided nails x 10 using sterile nail nipper -Awaiting new diabetic shoes -Patient advised to call the office if any problems or questions arise in the meantime. -Patient to return in 3 months for diabetic foot care and when called for diabetic shoes.  Landis Martins, DPM

## 2018-07-27 ENCOUNTER — Ambulatory Visit (INDEPENDENT_AMBULATORY_CARE_PROVIDER_SITE_OTHER): Payer: Medicare Other | Admitting: Sports Medicine

## 2018-07-27 ENCOUNTER — Encounter: Payer: Self-pay | Admitting: Sports Medicine

## 2018-07-27 VITALS — BP 174/88 | HR 86 | Resp 16

## 2018-07-27 DIAGNOSIS — M21372 Foot drop, left foot: Secondary | ICD-10-CM | POA: Diagnosis not present

## 2018-07-27 DIAGNOSIS — M21371 Foot drop, right foot: Secondary | ICD-10-CM

## 2018-07-27 DIAGNOSIS — L84 Corns and callosities: Secondary | ICD-10-CM

## 2018-07-27 DIAGNOSIS — E1142 Type 2 diabetes mellitus with diabetic polyneuropathy: Secondary | ICD-10-CM

## 2018-07-27 DIAGNOSIS — B351 Tinea unguium: Secondary | ICD-10-CM | POA: Diagnosis not present

## 2018-07-27 DIAGNOSIS — M79676 Pain in unspecified toe(s): Secondary | ICD-10-CM

## 2018-07-27 DIAGNOSIS — R29898 Other symptoms and signs involving the musculoskeletal system: Secondary | ICD-10-CM

## 2018-07-27 NOTE — Progress Notes (Signed)
Patient ID: Phillip Chandler, male   DOB: 1931/04/02, 85 y.o.   MRN: [id number] Subjective: Phillip Chandler is a 85 y.o. male patient with history of diabetes who presents to office today complaining of long, painful nails  while ambulating in shoes; unable to trim. Patient states that the glucose reading this morning was 135. Patient denies any new changes in medication or new problems. No other pedal complaints.  Patient Active Problem List   Diagnosis Date Noted  . Spinal stenosis in cervical region 01/27/2016  . Spinal stenosis, thoracic 01/27/2016  . Chronic anticoagulation   . Weakness of left leg 01/25/2016  . Accelerated hypertension 01/25/2016  . Undiagnosed cardiac murmurs 01/25/2016  . CVA (cerebral infarction) 01/25/2016  . Acid reflux 06/04/2015  . Airway hyperreactivity 06/04/2015  . Atrial fibrillation ([place]) 06/04/2015  . Ulcerative colitis ([place]) 06/04/2015  . Diastolic heart failure ([place]) 06/04/2015  . BP (high blood pressure) 06/04/2015  . Pure hypercholesterolemia 06/04/2015  . Cellulitis of extremity 09/12/2014  . Encounter for surgical follow-up care 08/22/2014  . Pain in shoulder 08/02/2014  . Pain in the wrist 08/02/2014  . Encounter for other preprocedural examination 08/02/2014  . Carpal tunnel syndrome 05/30/2014  . Entrapment of ulnar nerve 05/30/2014  . Nerve root pain 01/25/2014  . Absolute anemia 01/18/2014  . Brachial plexus injury 01/18/2014  . Rupture long head biceps tendon 01/18/2014  . Degenerative arthritis of spine 01/18/2014  . Ecchymoses, spontaneous 01/18/2014  . Synovitis or tenosynovitis of wrist 01/18/2014  . Thoracic and lumbosacral neuritis 01/18/2014  . Left sided ulcerative colitis ([place]) 01/18/2014  . Neuropathic ulnar nerve 01/18/2014  . Umbilical hernia without obstruction and without gangrene 01/18/2014  . Hernia of anterior abdominal wall 01/18/2014  . Asthmatic breathing 01/18/2014  . Allergic rhinitis 01/17/2014  . Benign  essential HTN 01/17/2014  . Callosity 01/17/2014  . Carotid artery narrowing 01/17/2014  . Chronic cerebral ischemia 01/17/2014  . Chronic obstructive pulmonary disease ([place]) 01/17/2014  . Congestive heart failure ([place]) 01/17/2014  . Cell phone elbow 01/17/2014  . Degeneration of intervertebral disc of lumbar region 01/17/2014  . Dermatophytic onychia 01/17/2014  . Dermatitis, eczematoid 01/17/2014  . Effusion into joint 01/17/2014  . Enthesopathy of hip 01/17/2014  . Asthma, exogenous 01/17/2014  . Adynamia 01/17/2014  . High potassium 01/17/2014  . HLD (hyperlipidemia) 01/17/2014  . Decreased potassium in the blood 01/17/2014  . Anemia, iron deficiency 01/17/2014  . Ache in joint 01/17/2014  . LBP (low back pain) 01/17/2014  . Edema leg 01/17/2014  . Lumbar canal stenosis 01/17/2014  . Morbid obesity ([place]) 01/17/2014  . Breathlessness lying flat 01/17/2014  . Arthritis of knee, degenerative 01/17/2014  . Arthralgia of hand 01/17/2014  . Arthralgia of hip or thigh 01/17/2014  . Arthralgia of shoulder 01/17/2014  . Peripheral vascular disease ([place]) 01/17/2014  . PNA (pneumonia) 01/17/2014  . Chronic restrictive lung disease 01/17/2014  . Breath shortness 01/17/2014  . Acute diastolic heart failure ([place]) 06/21/2012  . Anxiety 06/21/2012  . Stroke ([place]) 06/21/2012  . GERD (gastroesophageal reflux disease) 06/21/2012  . Cerebrovascular accident (CVA) ([place]) 06/21/2012  . Degenerative spondylolisthesis 06/20/2012  . Diabetes mellitus ([place]) 06/20/2012  . HTN (hypertension) 06/20/2012  . Atrial fibrillation with rapid ventricular response ([place]) 06/20/2012   Current Outpatient Medications on File Prior to Visit  Medication Sig Dispense Refill  . albuterol (PROAIR HFA) 108 (90 BASE) MCG/ACT inhaler Inhale 2 puffs into the lungs every 4 (four) hours as needed for wheezing  or shortness of breath.     Marland Kitchen ammonium lactate (AMLACTIN) 12 % cream Apply topically 3 (three) times daily as  needed (spots on arm). Take as directed.    . cloNIDine (CATAPRES) 0.1 MG tablet Take 1 tablet (0.1 mg total) by mouth 2 (two) times daily. 60 tablet 0  . digoxin (LANOXIN) 0.125 MG tablet Take 0.125 mg by mouth daily.     . fluticasone (FLONASE) 50 MCG/ACT nasal spray Place 2 sprays into both nostrils daily.     . fluticasone furoate-vilanterol (BREO ELLIPTA) 100-25 MCG/INH AEPB Inhale into the lungs.    . furosemide (LASIX) 40 MG tablet Take 40 mg by mouth daily.    Marland Kitchen HYDROcodone-acetaminophen (NORCO/VICODIN) 5-325 MG per tablet Take 1 tablet by mouth every 4 (four) hours as needed (pain).     . Insulin Glargine (LANTUS SOLOSTAR) 100 UNIT/ML Solostar Pen Inject 25 Units into the skin daily at 10 pm.    . labetalol (NORMODYNE) 200 MG tablet Take 100 mg by mouth 2 (two) times daily.     Marland Kitchen loratadine (CLARITIN) 10 MG tablet Take 10 mg by mouth daily.    Marland Kitchen losartan (COZAAR) 100 MG tablet Take 100 mg by mouth daily.     . mesalamine (LIALDA) 1.2 G EC tablet Take 1.2 g by mouth daily with breakfast.     . montelukast (SINGULAIR) 10 MG tablet Take 10 mg by mouth at bedtime.     Marland Kitchen omega-3 acid ethyl esters (LOVAZA) 1 g capsule Take 1 g by mouth 2 (two) times daily.    Marland Kitchen omeprazole (PRILOSEC) 40 MG capsule Take 40 mg by mouth daily.     . potassium chloride SA (KLOR-CON M20) 20 MEQ tablet Take 20 mEq by mouth 3 (three) times daily.     . predniSONE (STERAPRED UNI-PAK 48 TAB) 10 MG (48) TBPK tablet See admin instructions. see package  0  . pregabalin (LYRICA) 100 MG capsule Take 100 mg by mouth See admin instructions. Take 1 capsule (100 mg) by mouth every morning, noon and 5pm (take 200 mg capsule at bedtime)    . pregabalin (LYRICA) 200 MG capsule Take 200 mg by mouth at bedtime.    . rivaroxaban (XARELTO) 20 MG TABS tablet Take 20 mg by mouth daily with supper.     . simvastatin (ZOCOR) 40 MG tablet Take 20 mg by mouth at bedtime.     . sitaGLIPtin-metformin (JANUMET) 50-500 MG per tablet Take 1  tablet by mouth 2 (two) times daily with a meal.     . spironolactone (ALDACTONE) 25 MG tablet Take 12.5 mg by mouth daily.      No current facility-administered medications on file prior to visit.    Allergies  Allergen Reactions  . Altace [Ramipril] Shortness Of Breath and Rash  . Bee Venom Swelling    Mouth swelling - treated with benadryl  . Hydromorphone Anaphylaxis  . Pneumococcal Polysaccharide Vaccine Swelling  . Fentanyl Other (See Comments)    Personality change- cried a lot (reaction to combination of versed and fentanyl)  . Versed [Midazolam] Other (See Comments)    Change in personality (reaction to combination of fentanyl and versed)  . Canagliflozin-Metformin Hcl Other (See Comments)    dehydration  . Exenatide Other (See Comments)    Leg problems  . Nsaids Other (See Comments)    Pt cannot take with xarelto unknown  Contraindicated due to Xarelto  . Pneumococcal Vaccine Swelling    Swelling at injection site  .  Sulfa Antibiotics Rash and Other (See Comments)    unknown     No results found for this or any previous visit (from the past 2160 hour(s)).  Objective: General: Patient is awake, alert, and oriented x 3 and in no acute distress. Ambulates with bilateral braces.  Integument: Skin is warm, dry and supple bilateral. Nails are short and thickened consistent with onychomycosis, 1-5 bilateral. No signs of infection. No open lesions or preulcerative lesions present bilateral. Minimal keratosis and dry blood to medial aspect of left hallux/first metatarsophalangeal joint with no signs of infection. Remaining integument unremarkable.  Vasculature:  Dorsalis Pedis pulse 1/4 bilateral. Posterior Tibial pulse  1/4 bilateral.  Capillary fill time <3 sec 1-5 bilateral. Scant hair growth to the level of the digits. Temperature gradient within normal limits. No varicosities present bilateral. No edema present bilateral.   Neurology: The patient has absent sensation  measured with a 5.07/10g Semmes Weinstein Monofilament at all pedal sites bilateral . Vibratory sensation absent bilateral with tuning fork. No Babinski sign present bilateral.   Musculoskeletal: Planus and digital contracture secondary to dropfoot noted bilateral after spinal surgery. Muscular strength 4/5 in all lower extremity muscular groups bilateral without pain on range of motion . No tenderness with calf compression bilateral.  Assessment and Plan: Problem List Items Addressed This Visit      Nervous and Auditory   Weakness of left leg    Other Visit Diagnoses    Pain due to onychomycosis of toenail    -  Primary   Pre-ulcerative calluses       Diabetic polyneuropathy associated with type 2 diabetes mellitus (HCC)       Foot drop, bilateral          -Examined patient. -Discussed and educated patient on diabetic foot care, especially with  regards to the vascular, neurological and musculoskeletal systems.  -At no charge mechanically debrided callus at the left medial great toe using a sterile chisel blade and smoothed with bur -Debrided nails x 10 using sterile nail nipper -Patient advised to call the office if any problems or questions arise in the meantime. -Patient to return in 3 months for diabetic foot care and when called for diabetic shoes.  Landis Martins, DPM

## 2018-10-26 ENCOUNTER — Ambulatory Visit: Payer: Medicare Other | Admitting: Sports Medicine

## 2018-11-09 DIAGNOSIS — I509 Heart failure, unspecified: Secondary | ICD-10-CM

## 2018-11-09 DIAGNOSIS — I110 Hypertensive heart disease with heart failure: Secondary | ICD-10-CM

## 2018-11-09 DIAGNOSIS — I160 Hypertensive urgency: Secondary | ICD-10-CM

## 2018-11-09 DIAGNOSIS — R739 Hyperglycemia, unspecified: Secondary | ICD-10-CM | POA: Diagnosis not present

## 2018-11-09 DIAGNOSIS — I10 Essential (primary) hypertension: Secondary | ICD-10-CM | POA: Diagnosis not present

## 2018-11-09 DIAGNOSIS — I361 Nonrheumatic tricuspid (valve) insufficiency: Secondary | ICD-10-CM | POA: Diagnosis not present

## 2018-11-09 DIAGNOSIS — D649 Anemia, unspecified: Secondary | ICD-10-CM

## 2018-11-09 DIAGNOSIS — J189 Pneumonia, unspecified organism: Secondary | ICD-10-CM

## 2018-11-09 DIAGNOSIS — E669 Obesity, unspecified: Secondary | ICD-10-CM | POA: Diagnosis not present

## 2018-11-10 DIAGNOSIS — I10 Essential (primary) hypertension: Secondary | ICD-10-CM | POA: Diagnosis not present

## 2018-11-10 DIAGNOSIS — J9690 Respiratory failure, unspecified, unspecified whether with hypoxia or hypercapnia: Secondary | ICD-10-CM

## 2018-11-10 DIAGNOSIS — J189 Pneumonia, unspecified organism: Secondary | ICD-10-CM | POA: Diagnosis not present

## 2018-11-11 DIAGNOSIS — J9690 Respiratory failure, unspecified, unspecified whether with hypoxia or hypercapnia: Secondary | ICD-10-CM | POA: Diagnosis not present

## 2018-11-11 DIAGNOSIS — J189 Pneumonia, unspecified organism: Secondary | ICD-10-CM | POA: Diagnosis not present

## 2018-11-11 DIAGNOSIS — I10 Essential (primary) hypertension: Secondary | ICD-10-CM | POA: Diagnosis not present

## 2018-11-12 DIAGNOSIS — I10 Essential (primary) hypertension: Secondary | ICD-10-CM | POA: Diagnosis not present

## 2018-11-12 DIAGNOSIS — J9690 Respiratory failure, unspecified, unspecified whether with hypoxia or hypercapnia: Secondary | ICD-10-CM | POA: Diagnosis not present

## 2018-11-12 DIAGNOSIS — J189 Pneumonia, unspecified organism: Secondary | ICD-10-CM | POA: Diagnosis not present

## 2019-01-20 ENCOUNTER — Encounter: Payer: Self-pay | Admitting: Sports Medicine

## 2019-01-20 ENCOUNTER — Other Ambulatory Visit: Payer: Self-pay

## 2019-01-20 ENCOUNTER — Ambulatory Visit (INDEPENDENT_AMBULATORY_CARE_PROVIDER_SITE_OTHER): Payer: Medicare Other | Admitting: Sports Medicine

## 2019-01-20 VITALS — Temp 97.6°F | Resp 16

## 2019-01-20 DIAGNOSIS — M79676 Pain in unspecified toe(s): Secondary | ICD-10-CM

## 2019-01-20 DIAGNOSIS — M21371 Foot drop, right foot: Secondary | ICD-10-CM

## 2019-01-20 DIAGNOSIS — B351 Tinea unguium: Secondary | ICD-10-CM | POA: Diagnosis not present

## 2019-01-20 DIAGNOSIS — E1142 Type 2 diabetes mellitus with diabetic polyneuropathy: Secondary | ICD-10-CM | POA: Diagnosis not present

## 2019-01-20 DIAGNOSIS — M21372 Foot drop, left foot: Secondary | ICD-10-CM

## 2019-01-20 DIAGNOSIS — S90129A Contusion of unspecified lesser toe(s) without damage to nail, initial encounter: Secondary | ICD-10-CM

## 2019-01-20 DIAGNOSIS — R29898 Other symptoms and signs involving the musculoskeletal system: Secondary | ICD-10-CM

## 2019-01-20 DIAGNOSIS — L84 Corns and callosities: Secondary | ICD-10-CM

## 2019-01-20 NOTE — Progress Notes (Signed)
Patient ID: Phillip Chandler, male   DOB: Oct 28, 1930, 85 y.o.   MRN: [id number] Subjective: Phillip Chandler is a 85 y.o. male patient with history of diabetes who presents to office today complaining of long, painful nails  while ambulating in shoes; unable to trim. Patient states that the glucose reading this morning was 218 which is elevated because he just received a cortisone shot in his hip.  Patient reports that since I last saw him he was admitted to the hospital and had to have fluid drawn off because he could not breathe.  Patient reports that he is doing fine now.  Patient denies any recent changes with medications.  No other pedal complaints.  Patient Active Problem List   Diagnosis Date Noted  . Spinal stenosis in cervical region 01/27/2016  . Spinal stenosis, thoracic 01/27/2016  . Chronic anticoagulation   . Weakness of left leg 01/25/2016  . Accelerated hypertension 01/25/2016  . Undiagnosed cardiac murmurs 01/25/2016  . CVA (cerebral infarction) 01/25/2016  . Acid reflux 06/04/2015  . Airway hyperreactivity 06/04/2015  . Atrial fibrillation ([place]) 06/04/2015  . Ulcerative colitis ([place]) 06/04/2015  . Diastolic heart failure ([place]) 06/04/2015  . BP (high blood pressure) 06/04/2015  . Pure hypercholesterolemia 06/04/2015  . Cellulitis of extremity 09/12/2014  . Encounter for surgical follow-up care 08/22/2014  . Pain in shoulder 08/02/2014  . Pain in the wrist 08/02/2014  . Encounter for other preprocedural examination 08/02/2014  . Carpal tunnel syndrome 05/30/2014  . Entrapment of ulnar nerve 05/30/2014  . Nerve root pain 01/25/2014  . Absolute anemia 01/18/2014  . Brachial plexus injury 01/18/2014  . Rupture long head biceps tendon 01/18/2014  . Degenerative arthritis of spine 01/18/2014  . Ecchymoses, spontaneous 01/18/2014  . Synovitis or tenosynovitis of wrist 01/18/2014  . Thoracic and lumbosacral neuritis 01/18/2014  . Left sided ulcerative colitis ([place]) 01/18/2014   . Neuropathic ulnar nerve 01/18/2014  . Umbilical hernia without obstruction and without gangrene 01/18/2014  . Hernia of anterior abdominal wall 01/18/2014  . Asthmatic breathing 01/18/2014  . Allergic rhinitis 01/17/2014  . Benign essential HTN 01/17/2014  . Callosity 01/17/2014  . Carotid artery narrowing 01/17/2014  . Chronic cerebral ischemia 01/17/2014  . Chronic obstructive pulmonary disease ([place]) 01/17/2014  . Congestive heart failure ([place]) 01/17/2014  . Cell phone elbow 01/17/2014  . Degeneration of intervertebral disc of lumbar region 01/17/2014  . Dermatophytic onychia 01/17/2014  . Dermatitis, eczematoid 01/17/2014  . Effusion into joint 01/17/2014  . Enthesopathy of hip 01/17/2014  . Asthma, exogenous 01/17/2014  . Adynamia 01/17/2014  . High potassium 01/17/2014  . HLD (hyperlipidemia) 01/17/2014  . Decreased potassium in the blood 01/17/2014  . Anemia, iron deficiency 01/17/2014  . Ache in joint 01/17/2014  . LBP (low back pain) 01/17/2014  . Edema leg 01/17/2014  . Lumbar canal stenosis 01/17/2014  . Morbid obesity ([place]) 01/17/2014  . Breathlessness lying flat 01/17/2014  . Arthritis of knee, degenerative 01/17/2014  . Arthralgia of hand 01/17/2014  . Arthralgia of hip or thigh 01/17/2014  . Arthralgia of shoulder 01/17/2014  . Peripheral vascular disease ([place]) 01/17/2014  . PNA (pneumonia) 01/17/2014  . Chronic restrictive lung disease 01/17/2014  . Breath shortness 01/17/2014  . Acute diastolic heart failure ([place]) 06/21/2012  . Anxiety 06/21/2012  . Stroke ([place]) 06/21/2012  . GERD (gastroesophageal reflux disease) 06/21/2012  . Cerebrovascular accident (CVA) ([place]) 06/21/2012  . Degenerative spondylolisthesis 06/20/2012  . Diabetes mellitus ([place]) 06/20/2012  . HTN (hypertension) 06/20/2012  .  Atrial fibrillation with rapid ventricular response ([place]) 06/20/2012   Current Outpatient Medications on File Prior to Visit  Medication Sig Dispense Refill  .  metFORMIN (GLUCOPHAGE) 500 MG tablet TAKE 1 TABLET BY MOUTH TWICE DAILY WITH MEALS    . albuterol (PROAIR HFA) 108 (90 BASE) MCG/ACT inhaler Inhale 2 puffs into the lungs every 4 (four) hours as needed for wheezing or shortness of breath.     Marland Kitchen ammonium lactate (AMLACTIN) 12 % cream Apply topically 3 (three) times daily as needed (spots on arm). Take as directed.    . cloNIDine (CATAPRES) 0.1 MG tablet Take 1 tablet (0.1 mg total) by mouth 2 (two) times daily. 60 tablet 0  . digoxin (LANOXIN) 0.125 MG tablet Take 0.125 mg by mouth daily.     Marland Kitchen esomeprazole (NEXIUM) 40 MG capsule TAKE 1 CAPSULE BY MOUTH ONCE DAILY BEFORE BREAKFAST    . ferrous gluconate (FERGON) 324 MG tablet TAKE 1 TABLET BY MOUTH ONCE DAILY WITH BREAKFAST    . fluticasone (FLONASE) 50 MCG/ACT nasal spray Place 2 sprays into both nostrils daily.     . fluticasone furoate-vilanterol (BREO ELLIPTA) 100-25 MCG/INH AEPB Inhale into the lungs.    . furosemide (LASIX) 40 MG tablet Take 40 mg by mouth daily.    Marland Kitchen HYDROcodone-acetaminophen (NORCO/VICODIN) 5-325 MG per tablet Take 1 tablet by mouth every 4 (four) hours as needed (pain).     . Insulin Glargine (LANTUS SOLOSTAR) 100 UNIT/ML Solostar Pen Inject 25 Units into the skin daily at 10 pm.    . labetalol (NORMODYNE) 200 MG tablet Take 100 mg by mouth 2 (two) times daily.     Marland Kitchen loratadine (CLARITIN) 10 MG tablet Take 10 mg by mouth daily.    Marland Kitchen losartan (COZAAR) 100 MG tablet Take 100 mg by mouth daily.     . mesalamine (LIALDA) 1.2 G EC tablet Take 1.2 g by mouth daily with breakfast.     . montelukast (SINGULAIR) 10 MG tablet Take 10 mg by mouth at bedtime.     Marland Kitchen omega-3 acid ethyl esters (LOVAZA) 1 g capsule Take 1 g by mouth 2 (two) times daily.    Marland Kitchen omeprazole (PRILOSEC) 40 MG capsule Take 40 mg by mouth daily.     . potassium chloride SA (KLOR-CON M20) 20 MEQ tablet Take 20 mEq by mouth 3 (three) times daily.     . predniSONE (STERAPRED UNI-PAK 48 TAB) 10 MG (48) TBPK tablet  See admin instructions. see package  0  . pregabalin (LYRICA) 100 MG capsule Take 100 mg by mouth See admin instructions. Take 1 capsule (100 mg) by mouth every morning, noon and 5pm (take 200 mg capsule at bedtime)    . pregabalin (LYRICA) 200 MG capsule Take 200 mg by mouth at bedtime.    Marland Kitchen PRODIGY NO CODING BLOOD GLUC test strip USE 1 STRIP TO CHECK GLUCOSE TWICE DAILY    . rivaroxaban (XARELTO) 20 MG TABS tablet Take 20 mg by mouth daily with supper.     . simvastatin (ZOCOR) 40 MG tablet Take 20 mg by mouth at bedtime.     . sitaGLIPtin-metformin (JANUMET) 50-500 MG per tablet Take 1 tablet by mouth 2 (two) times daily with a meal.     . spironolactone (ALDACTONE) 25 MG tablet Take 12.5 mg by mouth daily.      No current facility-administered medications on file prior to visit.    Allergies  Allergen Reactions  . Altace [Ramipril] Shortness Of Breath  and Rash  . Bee Venom Swelling    Mouth swelling - treated with benadryl  . Hydromorphone Anaphylaxis  . Pneumococcal Polysaccharide Vaccine Swelling  . Fentanyl Other (See Comments)    Personality change- cried a lot (reaction to combination of versed and fentanyl)  . Versed [Midazolam] Other (See Comments)    Change in personality (reaction to combination of fentanyl and versed)  . Canagliflozin-Metformin Hcl Other (See Comments)    dehydration  . Exenatide Other (See Comments)    Leg problems  . Nsaids Other (See Comments)    Pt cannot take with xarelto unknown  Contraindicated due to Xarelto Contraindicated due to Xarelto  . Pneumococcal Vaccine Swelling    Swelling at injection site  . Sulfa Antibiotics Rash and Other (See Comments)    unknown  unknown    No results found for this or any previous visit (from the past 2160 hour(s)).  Objective: General: Patient is awake, alert, and oriented x 3 and in no acute distress. Ambulates with bilateral braces.  Integument: Skin is warm, dry and supple bilateral. Nails are  short and thickened consistent with onychomycosis, 1-5 bilateral. No signs of infection. No open lesions or preulcerative lesions present bilateral. Minimal to right foot pain patient reports that he snagged his left fifth toe when he was trying to put on his shoe on the right foot that caused it to be bruised. Remaining integument unremarkable.  Vasculature:  Dorsalis Pedis pulse 1/4 bilateral. Posterior Tibial pulse  1/4 bilateral.  Capillary fill time <3 sec 1-5 bilateral. Scant hair growth to the level of the digits. Temperature gradient within normal limits. No varicosities present bilateral. No edema present bilateral.   Neurology: The patient has absent sensation measured with a 5.07/10g Semmes Weinstein Monofilament at all pedal sites bilateral . Vibratory sensation absent bilateral with tuning fork. No Babinski sign present bilateral.   Musculoskeletal: Planus and digital contracture secondary to dropfoot noted bilateral after spinal surgery. Muscular strength 4/5 in all lower extremity muscular groups bilateral without pain on range of motion . No tenderness with calf compression bilateral.  Assessment and Plan: Problem List Items Addressed This Visit      Nervous and Auditory   Weakness of left leg    Other Visit Diagnoses    Pain due to onychomycosis of toenail    -  Primary   Pre-ulcerative calluses       Diabetic polyneuropathy associated with type 2 diabetes mellitus (HCC)       Relevant Medications   metFORMIN (GLUCOPHAGE) 500 MG tablet   Foot drop, bilateral       Bruised toe          -Examined patient. -Discussed and educated patient on diabetic foot care, especially with regards to the vascular, neurological and musculoskeletal systems.  -At no charge mechanically debrided callus at the left medial great toe using a sterile chisel blade and smoothed with bur -Debrided nails x 10 using sterile nail nipper -We will closely monitor bruise at right fifth toe if worsens  to return to office sooner -Patient advised to call the office if any problems or questions arise in the meantime. -Patient to return in 3 months for diabetic foot care and when called for diabetic shoes.  Landis Martins, DPM

## 2019-04-26 ENCOUNTER — Ambulatory Visit: Payer: Medicare Other | Admitting: Sports Medicine

## 2019-06-23 DIAGNOSIS — D509 Iron deficiency anemia, unspecified: Secondary | ICD-10-CM

## 2019-10-04 ENCOUNTER — Encounter: Payer: Self-pay | Admitting: Sports Medicine

## 2019-10-04 ENCOUNTER — Other Ambulatory Visit: Payer: Self-pay

## 2019-10-04 ENCOUNTER — Ambulatory Visit (INDEPENDENT_AMBULATORY_CARE_PROVIDER_SITE_OTHER): Payer: Medicare Other | Admitting: Sports Medicine

## 2019-10-04 DIAGNOSIS — E1142 Type 2 diabetes mellitus with diabetic polyneuropathy: Secondary | ICD-10-CM

## 2019-10-04 DIAGNOSIS — R29898 Other symptoms and signs involving the musculoskeletal system: Secondary | ICD-10-CM

## 2019-10-04 DIAGNOSIS — M79676 Pain in unspecified toe(s): Secondary | ICD-10-CM | POA: Diagnosis not present

## 2019-10-04 DIAGNOSIS — L84 Corns and callosities: Secondary | ICD-10-CM

## 2019-10-04 DIAGNOSIS — B351 Tinea unguium: Secondary | ICD-10-CM | POA: Diagnosis not present

## 2019-10-04 DIAGNOSIS — S90212A Contusion of left great toe with damage to nail, initial encounter: Secondary | ICD-10-CM

## 2019-10-04 NOTE — Progress Notes (Signed)
Patient ID: Phillip Chandler, male   DOB: [birth year], 86 y.o.   MRN: [id number] Subjective: Phillip Chandler is a 86 y.o. male patient with history of diabetes who presents to office today complaining of long, painful nails  while ambulating in shoes; unable to trim. Patient states that his left great toenail is black underneath the nail denies any pain redness warmth swelling or drainage but has been this way for the last month or more does not recall any type of injury to this area.  Reports glucose reading this morning was 132 and last visit to PCP Dr. Jenny Reichmann was 1 month ago. No other pedal complaints.  Patient Active Problem List   Diagnosis Date Noted  . Spinal stenosis in cervical region 01/27/2016  . Spinal stenosis, thoracic 01/27/2016  . Chronic anticoagulation   . Weakness of left leg 01/25/2016  . Accelerated hypertension 01/25/2016  . Undiagnosed cardiac murmurs 01/25/2016  . CVA (cerebral infarction) 01/25/2016  . Acid reflux 06/04/2015  . Airway hyperreactivity 06/04/2015  . Atrial fibrillation ([place]) 06/04/2015  . Ulcerative colitis ([place]) 06/04/2015  . Diastolic heart failure ([place]) 06/04/2015  . BP (high blood pressure) 06/04/2015  . Pure hypercholesterolemia 06/04/2015  . Cellulitis of extremity 09/12/2014  . Encounter for surgical follow-up care 08/22/2014  . Pain in shoulder 08/02/2014  . Pain in the wrist 08/02/2014  . Encounter for other preprocedural examination 08/02/2014  . Carpal tunnel syndrome 05/30/2014  . Entrapment of ulnar nerve 05/30/2014  . Nerve root pain 01/25/2014  . Absolute anemia 01/18/2014  . Brachial plexus injury 01/18/2014  . Rupture long head biceps tendon 01/18/2014  . Degenerative arthritis of spine 01/18/2014  . Ecchymoses, spontaneous 01/18/2014  . Synovitis or tenosynovitis of wrist 01/18/2014  . Thoracic and lumbosacral neuritis 01/18/2014  . Left sided ulcerative colitis ([place]) 01/18/2014  . Neuropathic ulnar nerve 01/18/2014  .  Umbilical hernia without obstruction and without gangrene 01/18/2014  . Hernia of anterior abdominal wall 01/18/2014  . Asthmatic breathing 01/18/2014  . Allergic rhinitis 01/17/2014  . Benign essential HTN 01/17/2014  . Callosity 01/17/2014  . Carotid artery narrowing 01/17/2014  . Chronic cerebral ischemia 01/17/2014  . Chronic obstructive pulmonary disease ([place]) 01/17/2014  . Congestive heart failure ([place]) 01/17/2014  . Cell phone elbow 01/17/2014  . Degeneration of intervertebral disc of lumbar region 01/17/2014  . Dermatophytic onychia 01/17/2014  . Dermatitis, eczematoid 01/17/2014  . Effusion into joint 01/17/2014  . Enthesopathy of hip 01/17/2014  . Asthma, exogenous 01/17/2014  . Adynamia 01/17/2014  . High potassium 01/17/2014  . HLD (hyperlipidemia) 01/17/2014  . Decreased potassium in the blood 01/17/2014  . Anemia, iron deficiency 01/17/2014  . Ache in joint 01/17/2014  . LBP (low back pain) 01/17/2014  . Edema leg 01/17/2014  . Lumbar canal stenosis 01/17/2014  . Morbid obesity ([place]) 01/17/2014  . Breathlessness lying flat 01/17/2014  . Arthritis of knee, degenerative 01/17/2014  . Arthralgia of hand 01/17/2014  . Arthralgia of hip or thigh 01/17/2014  . Arthralgia of shoulder 01/17/2014  . Peripheral vascular disease ([place]) 01/17/2014  . PNA (pneumonia) 01/17/2014  . Chronic restrictive lung disease 01/17/2014  . Breath shortness 01/17/2014  . Acute diastolic heart failure ([place]) 06/21/2012  . Anxiety 06/21/2012  . Stroke ([place]) 06/21/2012  . GERD (gastroesophageal reflux disease) 06/21/2012  . Cerebrovascular accident (CVA) ([place]) 06/21/2012  . Degenerative spondylolisthesis 06/20/2012  . Diabetes mellitus ([place]) 06/20/2012  . HTN (hypertension) 06/20/2012  . Atrial fibrillation with rapid ventricular response ([place])  06/20/2012   Current Outpatient Medications on File Prior to Visit  Medication Sig Dispense Refill  . albuterol (PROAIR HFA) 108 (90 BASE)  MCG/ACT inhaler Inhale 2 puffs into the lungs every 4 (four) hours as needed for wheezing or shortness of breath.     Marland Kitchen ammonium lactate (AMLACTIN) 12 % cream Apply topically 3 (three) times daily as needed (spots on arm). Take as directed.    . cloNIDine (CATAPRES) 0.1 MG tablet Take 1 tablet (0.1 mg total) by mouth 2 (two) times daily. 60 tablet 0  . digoxin (LANOXIN) 0.125 MG tablet Take 0.125 mg by mouth daily.     Marland Kitchen esomeprazole (NEXIUM) 40 MG capsule TAKE 1 CAPSULE BY MOUTH ONCE DAILY BEFORE BREAKFAST    . ferrous gluconate (FERGON) 324 MG tablet TAKE 1 TABLET BY MOUTH ONCE DAILY WITH BREAKFAST    . fluticasone (FLONASE) 50 MCG/ACT nasal spray Place 2 sprays into both nostrils daily.     . fluticasone furoate-vilanterol (BREO ELLIPTA) 100-25 MCG/INH AEPB Inhale into the lungs.    . furosemide (LASIX) 40 MG tablet Take 40 mg by mouth daily.    Marland Kitchen HYDROcodone-acetaminophen (NORCO/VICODIN) 5-325 MG per tablet Take 1 tablet by mouth every 4 (four) hours as needed (pain).     . Insulin Glargine (LANTUS SOLOSTAR) 100 UNIT/ML Solostar Pen Inject 25 Units into the skin daily at 10 pm.    . labetalol (NORMODYNE) 200 MG tablet Take 100 mg by mouth 2 (two) times daily.     Marland Kitchen loratadine (CLARITIN) 10 MG tablet Take 10 mg by mouth daily.    Marland Kitchen losartan (COZAAR) 100 MG tablet Take 100 mg by mouth daily.     . mesalamine (LIALDA) 1.2 G EC tablet Take 1.2 g by mouth daily with breakfast.     . metFORMIN (GLUCOPHAGE) 500 MG tablet TAKE 1 TABLET BY MOUTH TWICE DAILY WITH MEALS    . montelukast (SINGULAIR) 10 MG tablet Take 10 mg by mouth at bedtime.     Marland Kitchen omega-3 acid ethyl esters (LOVAZA) 1 g capsule Take 1 g by mouth 2 (two) times daily.    Marland Kitchen omeprazole (PRILOSEC) 40 MG capsule Take 40 mg by mouth daily.     . potassium chloride SA (KLOR-CON M20) 20 MEQ tablet Take 20 mEq by mouth 3 (three) times daily.     . predniSONE (STERAPRED UNI-PAK 48 TAB) 10 MG (48) TBPK tablet See admin instructions. see package  0   . pregabalin (LYRICA) 100 MG capsule Take 100 mg by mouth See admin instructions. Take 1 capsule (100 mg) by mouth every morning, noon and 5pm (take 200 mg capsule at bedtime)    . pregabalin (LYRICA) 200 MG capsule Take 200 mg by mouth at bedtime.    Marland Kitchen PRODIGY NO CODING BLOOD GLUC test strip USE 1 STRIP TO CHECK GLUCOSE TWICE DAILY    . rivaroxaban (XARELTO) 20 MG TABS tablet Take 20 mg by mouth daily with supper.     . simvastatin (ZOCOR) 40 MG tablet Take 20 mg by mouth at bedtime.     . sitaGLIPtin-metformin (JANUMET) 50-500 MG per tablet Take 1 tablet by mouth 2 (two) times daily with a meal.     . spironolactone (ALDACTONE) 25 MG tablet Take 12.5 mg by mouth daily.      No current facility-administered medications on file prior to visit.   Allergies  Allergen Reactions  . Altace [Ramipril] Shortness Of Breath and Rash  . Bee Venom Swelling  Mouth swelling - treated with benadryl  . Hydromorphone Anaphylaxis  . Pneumococcal Polysaccharide Vaccine Swelling  . Fentanyl Other (See Comments)    Personality change- cried a lot (reaction to combination of versed and fentanyl)  . Versed [Midazolam] Other (See Comments)    Change in personality (reaction to combination of fentanyl and versed)  . Canagliflozin-Metformin Hcl Other (See Comments)    dehydration  . Exenatide Other (See Comments)    Leg problems  . Nsaids Other (See Comments)    Pt cannot take with xarelto unknown  Contraindicated due to Xarelto Contraindicated due to Xarelto  . Pneumococcal Vaccine Swelling    Swelling at injection site  . Sulfa Antibiotics Rash and Other (See Comments)    unknown  unknown    No results found for this or any previous visit (from the past 2160 hour(s)).  Objective: General: Patient is awake, alert, and oriented x 3 and in no acute distress. Ambulates with bilateral braces.  Integument: Skin is warm, dry and supple bilateral. Nails are short and thickened consistent with  onychomycosis, 1-5 bilateral. No signs of infection.  Dried blood underneath left hallux nail bed appears to be stable with nail still well attached encompassing 100% of the nailbed with no surrounding signs of infection.  No open lesions or preulcerative lesions present bilateral.  Mild reactive callus medial first MPJs bilateral. Remaining integument unremarkable.  Left leg is bandaged patient is currently going to wound care center.  Vasculature:  Dorsalis Pedis pulse 1/4 bilateral. Posterior Tibial pulse  0/4 bilateral due to trace edema ankles bilateral. Capillary fill time <3 sec 1-5 bilateral. Scant hair growth to the level of the digits.  Temperature within normal limits bilateral.  Neurology: The patient has absent sensation measured with a 5.07/10g Semmes Weinstein Monofilament at all pedal sites bilateral . Vibratory sensation absent bilateral with tuning fork. No Babinski sign present bilateral.   Musculoskeletal: Planus and digital contracture secondary to dropfoot noted bilateral after spinal surgery. Muscular strength 4/5 in all lower extremity muscular groups bilateral without pain on range of motion . No tenderness with calf compression bilateral.  Assessment and Plan: Problem List Items Addressed This Visit      Nervous and Auditory   Weakness of left leg    Other Visit Diagnoses    Pain due to onychomycosis of toenail    -  Primary   Pre-ulcerative calluses       Diabetic polyneuropathy associated with type 2 diabetes mellitus (HCC)       Subungual hematoma of great toe of left foot, initial encounter          -Examined patient. -Discussed and educated patient on diabetic foot care, especially with regards to the vascular, neurological and musculoskeletal systems.  -At no charge mechanically debrided callus at the left and right medial great toe using a sterile chisel blade and smoothed with bur -Dispensed sample of foot miracle cream for patient to use to dry skin  area -Debrided nails x 10 using sterile nail nipper without incident -We will closely monitor bruise dried blood at left hallux toenail at this time appears to be stable -Patient advised to call the office if any problems or questions arise in the meantime. -Patient to return in 3 months for diabetic foot care and when called for diabetic shoes.  Patient to continue with wound care by home nurse and wound care center for leg ulceration on left.  Landis Martins, DPM

## 2020-01-17 ENCOUNTER — Encounter: Payer: Self-pay | Admitting: Sports Medicine

## 2020-01-17 ENCOUNTER — Ambulatory Visit (INDEPENDENT_AMBULATORY_CARE_PROVIDER_SITE_OTHER): Payer: Medicare Other | Admitting: Sports Medicine

## 2020-01-17 ENCOUNTER — Other Ambulatory Visit: Payer: Self-pay

## 2020-01-17 DIAGNOSIS — M21371 Foot drop, right foot: Secondary | ICD-10-CM

## 2020-01-17 DIAGNOSIS — L84 Corns and callosities: Secondary | ICD-10-CM

## 2020-01-17 DIAGNOSIS — E1142 Type 2 diabetes mellitus with diabetic polyneuropathy: Secondary | ICD-10-CM | POA: Diagnosis not present

## 2020-01-17 DIAGNOSIS — B351 Tinea unguium: Secondary | ICD-10-CM | POA: Diagnosis not present

## 2020-01-17 DIAGNOSIS — S90212D Contusion of left great toe with damage to nail, subsequent encounter: Secondary | ICD-10-CM

## 2020-01-17 DIAGNOSIS — M79676 Pain in unspecified toe(s): Secondary | ICD-10-CM

## 2020-01-17 DIAGNOSIS — R29898 Other symptoms and signs involving the musculoskeletal system: Secondary | ICD-10-CM

## 2020-01-17 DIAGNOSIS — M21372 Foot drop, left foot: Secondary | ICD-10-CM

## 2020-01-17 NOTE — Progress Notes (Signed)
Patient ID: Phillip Chandler, male   DOB: March 09, 1931, 86 y.o.   MRN: [id number] Subjective: Phillip Chandler is a 86 y.o. male patient with history of diabetes who presents to office today complaining of long, painful nails  while ambulating in shoes; unable to trim. Patient states that his left great toenail is still black and his daughter wants it checked since it has not fallen off yet.  Reports glucose reading this morning was 130 and last visit to PCP Dr. Jenny Reichmann was 1 month ago. No other pedal complaints.  Patient Active Problem List   Diagnosis Date Noted  . Spinal stenosis in cervical region 01/27/2016  . Spinal stenosis, thoracic 01/27/2016  . Chronic anticoagulation   . Weakness of left leg 01/25/2016  . Accelerated hypertension 01/25/2016  . Undiagnosed cardiac murmurs 01/25/2016  . CVA (cerebral infarction) 01/25/2016  . Acid reflux 06/04/2015  . Airway hyperreactivity 06/04/2015  . Atrial fibrillation ([place]) 06/04/2015  . Ulcerative colitis ([place]) 06/04/2015  . Diastolic heart failure ([place]) 06/04/2015  . BP (high blood pressure) 06/04/2015  . Pure hypercholesterolemia 06/04/2015  . Cellulitis of extremity 09/12/2014  . Encounter for surgical follow-up care 08/22/2014  . Pain in shoulder 08/02/2014  . Pain in the wrist 08/02/2014  . Encounter for other preprocedural examination 08/02/2014  . Carpal tunnel syndrome 05/30/2014  . Entrapment of ulnar nerve 05/30/2014  . Nerve root pain 01/25/2014  . Absolute anemia 01/18/2014  . Brachial plexus injury 01/18/2014  . Rupture long head biceps tendon 01/18/2014  . Degenerative arthritis of spine 01/18/2014  . Ecchymoses, spontaneous 01/18/2014  . Synovitis or tenosynovitis of wrist 01/18/2014  . Thoracic and lumbosacral neuritis 01/18/2014  . Left sided ulcerative colitis ([place]) 01/18/2014  . Neuropathic ulnar nerve 01/18/2014  . Umbilical hernia without obstruction and without gangrene 01/18/2014  . Hernia of anterior  abdominal wall 01/18/2014  . Asthmatic breathing 01/18/2014  . Allergic rhinitis 01/17/2014  . Benign essential HTN 01/17/2014  . Callosity 01/17/2014  . Carotid artery narrowing 01/17/2014  . Chronic cerebral ischemia 01/17/2014  . Chronic obstructive pulmonary disease ([place]) 01/17/2014  . Congestive heart failure ([place]) 01/17/2014  . Cell phone elbow 01/17/2014  . Degeneration of intervertebral disc of lumbar region 01/17/2014  . Dermatophytic onychia 01/17/2014  . Dermatitis, eczematoid 01/17/2014  . Effusion into joint 01/17/2014  . Enthesopathy of hip 01/17/2014  . Asthma, exogenous 01/17/2014  . Adynamia 01/17/2014  . High potassium 01/17/2014  . HLD (hyperlipidemia) 01/17/2014  . Decreased potassium in the blood 01/17/2014  . Anemia, iron deficiency 01/17/2014  . Ache in joint 01/17/2014  . LBP (low back pain) 01/17/2014  . Edema leg 01/17/2014  . Lumbar canal stenosis 01/17/2014  . Morbid obesity ([place]) 01/17/2014  . Breathlessness lying flat 01/17/2014  . Arthritis of knee, degenerative 01/17/2014  . Arthralgia of hand 01/17/2014  . Arthralgia of hip or thigh 01/17/2014  . Arthralgia of shoulder 01/17/2014  . Peripheral vascular disease ([place]) 01/17/2014  . PNA (pneumonia) 01/17/2014  . Chronic restrictive lung disease 01/17/2014  . Breath shortness 01/17/2014  . Acute diastolic heart failure ([place]) 06/21/2012  . Anxiety 06/21/2012  . Stroke ([place]) 06/21/2012  . GERD (gastroesophageal reflux disease) 06/21/2012  . Cerebrovascular accident (CVA) ([place]) 06/21/2012  . Degenerative spondylolisthesis 06/20/2012  . Diabetes mellitus ([place]) 06/20/2012  . HTN (hypertension) 06/20/2012  . Atrial fibrillation with rapid ventricular response ([place]) 06/20/2012   Current Outpatient Medications on File Prior to Visit  Medication Sig Dispense Refill  .  albuterol (PROAIR HFA) 108 (90 BASE) MCG/ACT inhaler Inhale 2 puffs into the lungs every 4 (four) hours as needed for wheezing or  shortness of breath.     Marland Kitchen ammonium lactate (AMLACTIN) 12 % cream Apply topically 3 (three) times daily as needed (spots on arm). Take as directed.    . cloNIDine (CATAPRES) 0.1 MG tablet Take 1 tablet (0.1 mg total) by mouth 2 (two) times daily. 60 tablet 0  . digoxin (LANOXIN) 0.125 MG tablet Take 0.125 mg by mouth daily.     Marland Kitchen esomeprazole (NEXIUM) 40 MG capsule TAKE 1 CAPSULE BY MOUTH ONCE DAILY BEFORE BREAKFAST    . ferrous gluconate (FERGON) 324 MG tablet TAKE 1 TABLET BY MOUTH ONCE DAILY WITH BREAKFAST    . fluticasone (FLONASE) 50 MCG/ACT nasal spray Place 2 sprays into both nostrils daily.     . fluticasone furoate-vilanterol (BREO ELLIPTA) 100-25 MCG/INH AEPB Inhale into the lungs.    . furosemide (LASIX) 40 MG tablet Take 40 mg by mouth daily.    Marland Kitchen HYDROcodone-acetaminophen (NORCO/VICODIN) 5-325 MG per tablet Take 1 tablet by mouth every 4 (four) hours as needed (pain).     . Insulin Glargine (LANTUS SOLOSTAR) 100 UNIT/ML Solostar Pen Inject 25 Units into the skin daily at 10 pm.    . labetalol (NORMODYNE) 200 MG tablet Take 100 mg by mouth 2 (two) times daily.     Marland Kitchen loratadine (CLARITIN) 10 MG tablet Take 10 mg by mouth daily.    Marland Kitchen losartan (COZAAR) 100 MG tablet Take 100 mg by mouth daily.     . mesalamine (LIALDA) 1.2 G EC tablet Take 1.2 g by mouth daily with breakfast.     . metFORMIN (GLUCOPHAGE) 500 MG tablet TAKE 1 TABLET BY MOUTH TWICE DAILY WITH MEALS    . montelukast (SINGULAIR) 10 MG tablet Take 10 mg by mouth at bedtime.     Marland Kitchen omega-3 acid ethyl esters (LOVAZA) 1 g capsule Take 1 g by mouth 2 (two) times daily.    Marland Kitchen omeprazole (PRILOSEC) 40 MG capsule Take 40 mg by mouth daily.     . potassium chloride SA (KLOR-CON M20) 20 MEQ tablet Take 20 mEq by mouth 3 (three) times daily.     . predniSONE (STERAPRED UNI-PAK 48 TAB) 10 MG (48) TBPK tablet See admin instructions. see package  0  . pregabalin (LYRICA) 100 MG capsule Take 100 mg by mouth See admin instructions. Take 1  capsule (100 mg) by mouth every morning, noon and 5pm (take 200 mg capsule at bedtime)    . pregabalin (LYRICA) 200 MG capsule Take 200 mg by mouth at bedtime.    Marland Kitchen PRODIGY NO CODING BLOOD GLUC test strip USE 1 STRIP TO CHECK GLUCOSE TWICE DAILY    . rivaroxaban (XARELTO) 20 MG TABS tablet Take 20 mg by mouth daily with supper.     . simvastatin (ZOCOR) 40 MG tablet Take 20 mg by mouth at bedtime.     . sitaGLIPtin-metformin (JANUMET) 50-500 MG per tablet Take 1 tablet by mouth 2 (two) times daily with a meal.     . spironolactone (ALDACTONE) 25 MG tablet Take 12.5 mg by mouth daily.      No current facility-administered medications on file prior to visit.   Allergies  Allergen Reactions  . Altace [Ramipril] Shortness Of Breath and Rash  . Bee Venom Swelling    Mouth swelling - treated with benadryl  . Hydromorphone Anaphylaxis  . Pneumococcal Polysaccharide Vaccine Swelling  .  Fentanyl Other (See Comments)    Personality change- cried a lot (reaction to combination of versed and fentanyl)  . Versed [Midazolam] Other (See Comments)    Change in personality (reaction to combination of fentanyl and versed)  . Canagliflozin-Metformin Hcl Other (See Comments)    dehydration  . Exenatide Other (See Comments)    Leg problems  . Nsaids Other (See Comments)    Pt cannot take with xarelto unknown  Contraindicated due to Xarelto Contraindicated due to Xarelto  . Pneumococcal Vaccine Swelling    Swelling at injection site  . Sulfa Antibiotics Rash and Other (See Comments)    unknown  unknown    No results found for this or any previous visit (from the past 2160 hour(s)).  Objective: General: Patient is awake, alert, and oriented x 3 and in no acute distress. Ambulates with bilateral braces.  Integument: Skin is warm, dry and supple bilateral. Nails are short and thickened consistent with onychomycosis, 1-5 bilateral. No signs of infection.  Dried blood underneath left hallux nail bed  appears to be stable with the nail partially attached appears to be loosening with no signs of infection.  No open lesions or preulcerative lesions present bilateral.  Mild reactive callus medial first MPJs bilateral. Remaining integument unremarkable.  Left leg is bandaged patient is currently has home nursing caring for blisters to leg from edema.  Vasculature:  Dorsalis Pedis pulse 1/4 bilateral. Posterior Tibial pulse  0/4 bilateral due to trace edema ankles bilateral. Capillary fill time <3 sec 1-5 bilateral. Scant hair growth to the level of the digits.  Temperature within normal limits bilateral.  Neurology: The patient has absent sensation measured with a 5.07/10g Semmes Weinstein Monofilament at all pedal sites bilateral . Vibratory sensation absent bilateral with tuning fork. No Babinski sign present bilateral.   Musculoskeletal: Planus and digital contracture secondary to dropfoot noted bilateral after spinal surgery. Muscular strength 4/5 in all lower extremity muscular groups bilateral without pain on range of motion . No tenderness with calf compression bilateral.  Assessment and Plan: Problem List Items Addressed This Visit      Nervous and Auditory   Weakness of left leg    Other Visit Diagnoses    Pain due to onychomycosis of toenail    -  Primary   Pre-ulcerative calluses       Diabetic polyneuropathy associated with type 2 diabetes mellitus (HCC)       Subungual hematoma of great toe of left foot, subsequent encounter       Foot drop, bilateral          -Examined patient. -Discussed and educated patient on diabetic foot care, especially with regards to the vascular, neurological and musculoskeletal systems.  -At no charge mechanically debrided callus at the left and right medial great toe using a sterile chisel blade and smoothed with bur -Debrided nails x 10 using sterile nail nipper without incident and remove any loose portions of the left hallux nail and advised  patient that the dry blood will slowly resorb and continue to grow out -Patient advised to call the office if any problems or questions arise in the meantime. -Patient to return in 3 months for diabetic foot care or sooner if problems or issues arise.  Landis Martins, DPM

## 2020-02-02 DIAGNOSIS — D509 Iron deficiency anemia, unspecified: Secondary | ICD-10-CM | POA: Diagnosis not present

## 2020-02-09 ENCOUNTER — Ambulatory Visit: Payer: Medicare Other | Admitting: Sports Medicine

## 2020-04-19 ENCOUNTER — Other Ambulatory Visit: Payer: Self-pay

## 2020-04-19 ENCOUNTER — Encounter: Payer: Self-pay | Admitting: Sports Medicine

## 2020-04-19 ENCOUNTER — Ambulatory Visit (INDEPENDENT_AMBULATORY_CARE_PROVIDER_SITE_OTHER): Payer: Medicare Other | Admitting: Sports Medicine

## 2020-04-19 DIAGNOSIS — M21371 Foot drop, right foot: Secondary | ICD-10-CM

## 2020-04-19 DIAGNOSIS — E1142 Type 2 diabetes mellitus with diabetic polyneuropathy: Secondary | ICD-10-CM

## 2020-04-19 DIAGNOSIS — I739 Peripheral vascular disease, unspecified: Secondary | ICD-10-CM | POA: Diagnosis not present

## 2020-04-19 DIAGNOSIS — B351 Tinea unguium: Secondary | ICD-10-CM | POA: Diagnosis not present

## 2020-04-19 DIAGNOSIS — M79676 Pain in unspecified toe(s): Secondary | ICD-10-CM

## 2020-04-19 DIAGNOSIS — M21372 Foot drop, left foot: Secondary | ICD-10-CM

## 2020-04-19 NOTE — Progress Notes (Signed)
Patient ID: Gurman Ashland, male   DOB: 31-Dec-1930, 86 y.o.   MRN: [id number] Subjective: Kristan Votta is a 86 y.o. male patient with history of diabetes who presents to office today complaining of long, painful nails  while ambulating in shoes; unable to trim. Patient states that the home nurses are doing wound care on the left leg and states that he had a Mohs procedure on his head by dermatology currently applying Vaseline to the area.  Fasting blood sugar not recorded.  No other pedal complaints.  Patient is assisted by daughter this visit.  Patient Active Problem List   Diagnosis Date Noted  . Spinal stenosis in cervical region 01/27/2016  . Spinal stenosis, thoracic 01/27/2016  . Chronic anticoagulation   . Weakness of left leg 01/25/2016  . Accelerated hypertension 01/25/2016  . Undiagnosed cardiac murmurs 01/25/2016  . CVA (cerebral infarction) 01/25/2016  . Acid reflux 06/04/2015  . Airway hyperreactivity 06/04/2015  . Atrial fibrillation ([place]) 06/04/2015  . Ulcerative colitis ([place]) 06/04/2015  . Diastolic heart failure ([place]) 06/04/2015  . BP (high blood pressure) 06/04/2015  . Pure hypercholesterolemia 06/04/2015  . Cellulitis of extremity 09/12/2014  . Encounter for surgical follow-up care 08/22/2014  . Pain in shoulder 08/02/2014  . Pain in the wrist 08/02/2014  . Encounter for other preprocedural examination 08/02/2014  . Carpal tunnel syndrome 05/30/2014  . Entrapment of ulnar nerve 05/30/2014  . Nerve root pain 01/25/2014  . Absolute anemia 01/18/2014  . Brachial plexus injury 01/18/2014  . Rupture long head biceps tendon 01/18/2014  . Degenerative arthritis of spine 01/18/2014  . Ecchymoses, spontaneous 01/18/2014  . Synovitis or tenosynovitis of wrist 01/18/2014  . Thoracic and lumbosacral neuritis 01/18/2014  . Left sided ulcerative colitis ([place]) 01/18/2014  . Neuropathic ulnar nerve 01/18/2014  . Umbilical hernia without obstruction and without  gangrene 01/18/2014  . Hernia of anterior abdominal wall 01/18/2014  . Asthmatic breathing 01/18/2014  . Allergic rhinitis 01/17/2014  . Benign essential HTN 01/17/2014  . Callosity 01/17/2014  . Carotid artery narrowing 01/17/2014  . Chronic cerebral ischemia 01/17/2014  . Chronic obstructive pulmonary disease ([place]) 01/17/2014  . Congestive heart failure ([place]) 01/17/2014  . Cell phone elbow 01/17/2014  . Degeneration of intervertebral disc of lumbar region 01/17/2014  . Dermatophytic onychia 01/17/2014  . Dermatitis, eczematoid 01/17/2014  . Effusion into joint 01/17/2014  . Enthesopathy of hip 01/17/2014  . Asthma, exogenous 01/17/2014  . Adynamia 01/17/2014  . High potassium 01/17/2014  . HLD (hyperlipidemia) 01/17/2014  . Decreased potassium in the blood 01/17/2014  . Anemia, iron deficiency 01/17/2014  . Ache in joint 01/17/2014  . LBP (low back pain) 01/17/2014  . Edema leg 01/17/2014  . Lumbar canal stenosis 01/17/2014  . Morbid obesity ([place]) 01/17/2014  . Breathlessness lying flat 01/17/2014  . Arthritis of knee, degenerative 01/17/2014  . Arthralgia of hand 01/17/2014  . Arthralgia of hip or thigh 01/17/2014  . Arthralgia of shoulder 01/17/2014  . Peripheral vascular disease ([place]) 01/17/2014  . PNA (pneumonia) 01/17/2014  . Chronic restrictive lung disease 01/17/2014  . Breath shortness 01/17/2014  . Acute diastolic heart failure ([place]) 06/21/2012  . Anxiety 06/21/2012  . Stroke ([place]) 06/21/2012  . GERD (gastroesophageal reflux disease) 06/21/2012  . Cerebrovascular accident (CVA) ([place]) 06/21/2012  . Degenerative spondylolisthesis 06/20/2012  . Diabetes mellitus ([place]) 06/20/2012  . HTN (hypertension) 06/20/2012  . Atrial fibrillation with rapid ventricular response ([place]) 06/20/2012   Current Outpatient Medications on File Prior to Visit  Medication Sig Dispense Refill  . albuterol (PROAIR HFA) 108 (90 BASE) MCG/ACT inhaler Inhale 2 puffs into the lungs every 4  (four) hours as needed for wheezing or shortness of breath.     Marland Kitchen ammonium lactate (AMLACTIN) 12 % cream Apply topically 3 (three) times daily as needed (spots on arm). Take as directed.    . cloNIDine (CATAPRES) 0.1 MG tablet Take 1 tablet (0.1 mg total) by mouth 2 (two) times daily. 60 tablet 0  . digoxin (LANOXIN) 0.125 MG tablet Take 0.125 mg by mouth daily.     Marland Kitchen esomeprazole (NEXIUM) 40 MG capsule TAKE 1 CAPSULE BY MOUTH ONCE DAILY BEFORE BREAKFAST    . ferrous gluconate (FERGON) 324 MG tablet TAKE 1 TABLET BY MOUTH ONCE DAILY WITH BREAKFAST    . fluticasone (FLONASE) 50 MCG/ACT nasal spray Place 2 sprays into both nostrils daily.     . fluticasone furoate-vilanterol (BREO ELLIPTA) 100-25 MCG/INH AEPB Inhale into the lungs.    . furosemide (LASIX) 40 MG tablet Take 40 mg by mouth daily.    Marland Kitchen HYDROcodone-acetaminophen (NORCO/VICODIN) 5-325 MG per tablet Take 1 tablet by mouth every 4 (four) hours as needed (pain).     . Insulin Glargine (LANTUS SOLOSTAR) 100 UNIT/ML Solostar Pen Inject 25 Units into the skin daily at 10 pm.    . labetalol (NORMODYNE) 200 MG tablet Take 100 mg by mouth 2 (two) times daily.     Marland Kitchen loratadine (CLARITIN) 10 MG tablet Take 10 mg by mouth daily.    Marland Kitchen losartan (COZAAR) 100 MG tablet Take 100 mg by mouth daily.     . mesalamine (LIALDA) 1.2 G EC tablet Take 1.2 g by mouth daily with breakfast.     . metFORMIN (GLUCOPHAGE) 500 MG tablet TAKE 1 TABLET BY MOUTH TWICE DAILY WITH MEALS    . montelukast (SINGULAIR) 10 MG tablet Take 10 mg by mouth at bedtime.     Marland Kitchen omega-3 acid ethyl esters (LOVAZA) 1 g capsule Take 1 g by mouth 2 (two) times daily.    Marland Kitchen omeprazole (PRILOSEC) 40 MG capsule Take 40 mg by mouth daily.     . potassium chloride SA (KLOR-CON M20) 20 MEQ tablet Take 20 mEq by mouth 3 (three) times daily.     . predniSONE (STERAPRED UNI-PAK 48 TAB) 10 MG (48) TBPK tablet See admin instructions. see package  0  . pregabalin (LYRICA) 100 MG capsule Take 100 mg by  mouth See admin instructions. Take 1 capsule (100 mg) by mouth every morning, noon and 5pm (take 200 mg capsule at bedtime)    . pregabalin (LYRICA) 200 MG capsule Take 200 mg by mouth at bedtime.    Marland Kitchen PRODIGY NO CODING BLOOD GLUC test strip USE 1 STRIP TO CHECK GLUCOSE TWICE DAILY    . rivaroxaban (XARELTO) 20 MG TABS tablet Take 20 mg by mouth daily with supper.     . simvastatin (ZOCOR) 40 MG tablet Take 20 mg by mouth at bedtime.     . sitaGLIPtin-metformin (JANUMET) 50-500 MG per tablet Take 1 tablet by mouth 2 (two) times daily with a meal.     . spironolactone (ALDACTONE) 25 MG tablet Take 12.5 mg by mouth daily.      No current facility-administered medications on file prior to visit.   Allergies  Allergen Reactions  . Altace [Ramipril] Shortness Of Breath and Rash  . Bee Venom Swelling    Mouth swelling - treated with benadryl  . Hydromorphone Anaphylaxis  .  Pneumococcal Polysaccharide Vaccine Swelling  . Fentanyl Other (See Comments)    Personality change- cried a lot (reaction to combination of versed and fentanyl)  . Versed [Midazolam] Other (See Comments)    Change in personality (reaction to combination of fentanyl and versed)  . Canagliflozin-Metformin Hcl Other (See Comments)    dehydration  . Exenatide Other (See Comments)    Leg problems  . Nsaids Other (See Comments)    Pt cannot take with xarelto unknown  Contraindicated due to Xarelto Contraindicated due to Xarelto  . Pneumococcal Vaccine Swelling    Swelling at injection site  . Sulfa Antibiotics Rash and Other (See Comments)    unknown  unknown    No results found for this or any previous visit (from the past 2160 hour(s)).  Objective: General: Patient is awake, alert, and oriented x 3 and in no acute distress. Ambulates with bilateral braces.  Integument: Skin is warm, dry and supple bilateral. Nails are short and thickened consistent with onychomycosis, 1-5 bilateral. No signs of infection.  No  open lesions or preulcerative lesions present bilateral.  Minimal reactive callus medial first MPJs bilateral. Remaining integument unremarkable.  Left leg is bandaged patient is currently has home nursing caring for chronic wound.  Vasculature:  Dorsalis Pedis pulse 1/4 bilateral. Posterior Tibial pulse  0/4 bilateral due to trace edema ankles bilateral. Capillary fill time <3 sec 1-5 bilateral. Scant hair growth to the level of the digits.  Temperature within normal limits bilateral.  Neurology: The patient has absent sensation measured with a 5.07/10g Semmes Weinstein Monofilament at all pedal sites bilateral . Vibratory sensation absent bilateral with tuning fork. No Babinski sign present bilateral.   Musculoskeletal: Planus and digital contracture secondary to dropfoot noted bilateral after spinal surgery. Muscular strength 4/5 in all lower extremity muscular groups bilateral without pain on range of motion . No tenderness with calf compression bilateral.  Assessment and Plan: Problem List Items Addressed This Visit    None    Visit Diagnoses    Pain due to onychomycosis of toenail    -  Primary   Diabetic polyneuropathy associated with type 2 diabetes mellitus (HCC)       Foot drop, bilateral       PVD (peripheral vascular disease) ([place])          -Examined patient. -Discussed and educated patient on diabetic foot care, especially with regards to the vascular, neurological and musculoskeletal systems.  -At no charge mechanically debrided/smoothed callus at the left and right medial great toe using a sterile chisel blade and smoothed with bur -Debrided nails x 10 using sterile nail nipper without inciden -Patient advised to call the office if any problems or questions arise in the meantime. -Patient to return in 3 months for diabetic foot care or sooner if problems or issues arise.  Landis Martins, DPM

## 2020-06-20 ENCOUNTER — Other Ambulatory Visit: Payer: Self-pay | Admitting: Hematology and Oncology

## 2020-06-20 ENCOUNTER — Telehealth: Payer: Self-pay | Admitting: Hematology and Oncology

## 2020-06-20 DIAGNOSIS — D509 Iron deficiency anemia, unspecified: Secondary | ICD-10-CM

## 2020-06-20 NOTE — Progress Notes (Signed)
Phillip Chandler  [address] [place],  [place]  [address] [phone]  Clinic Day:  06/21/2020  Referring physician: Houston Siren., MD   CHIEF COMPLAINT:  CC: An 87 year old male with a history of iron deficiency anemia.  Current Treatment:  Surveillance   HISTORY OF PRESENT ILLNESS:  Phillip Chandler is a 87 y.o. male with a history of  iron deficiency anemia.  In the past, IV Feraheme was very effective in replenishing his iron stores. He is unable to tolerate oral iron. He presents to clinic today for increasing fatigue, weakness and shortness of breath. He states this has been going on for a couple of weeks and he feels like he did a year ago when he was first evaluated and treated for iron deficiency. He does wear oxygen at home and has increased the flow and usage. He states his oxygen saturation has dropped to 87% with exertion, but recovers quickly. He denies fever, chills, nausea or vomiting. He denies issue with bowel or bladder. He denies cough or chest pain. CBC today reveals hemoglobin 8.5 and hematocrit 26.7. Iron studies today reveal iron 37, saturation 8.8 and ferritin 25.8. CMP reveals BUN 35, creatinine 1.3 and GFR 52. He is on daily lasix for congestive heart failure.   REVIEW OF SYSTEMS:  Review of Systems  Constitutional: Negative for appetite change, chills, diaphoresis, fatigue, fever and unexpected weight change.  HENT:   Negative for hearing loss, lump/mass, mouth sores, nosebleeds, sore throat, tinnitus, trouble swallowing and voice change.   Eyes: Negative for eye problems and icterus.  Respiratory: Positive for shortness of breath. Negative for chest tightness, cough, hemoptysis and wheezing.   Cardiovascular: Negative for chest pain, leg swelling and palpitations.  Gastrointestinal: Negative for abdominal distention, abdominal pain, blood in stool, constipation, diarrhea, nausea, rectal pain and vomiting.  Endocrine:  Negative for hot flashes.  Genitourinary: Negative for bladder incontinence, difficulty urinating, dyspareunia, dysuria, frequency, hematuria and nocturia.   Musculoskeletal: Negative for arthralgias, back pain, flank pain, gait problem, myalgias, neck pain and neck stiffness.  Skin: Negative for itching, rash and wound.  Neurological: Positive for extremity weakness and light-headedness. Negative for dizziness, gait problem, headaches, numbness, seizures and speech difficulty.  Hematological: Negative for adenopathy. Does not bruise/bleed easily.  Psychiatric/Behavioral: Negative for confusion, decreased concentration, depression, sleep disturbance and suicidal ideas. The patient is not nervous/anxious.      VITALS:  Blood pressure 133/60, pulse 75, temperature 98.4 F (36.9 C), temperature source Oral, resp. rate 20, height 5\' 7"  (1.702 m), SpO2 95 %.  Wt Readings from Last 3 Encounters:  01/25/16 269 lb 6.4 oz (122.2 kg)  06/28/12 289 lb 11 oz (131.4 kg)    Body mass index is 42.19 kg/m.  Performance status (ECOG): 2 - Symptomatic, <50% confined to bed  PHYSICAL EXAM:  Physical Exam Constitutional:      General: He is not in acute distress.    Appearance: Normal appearance. He is normal weight. He is not ill-appearing, toxic-appearing or diaphoretic.  HENT:     Head: Normocephalic and atraumatic.     Right Ear: Tympanic membrane normal.     Left Ear: Tympanic membrane normal.     Nose: Nose normal. No congestion or rhinorrhea.     Mouth/Throat:     Mouth: Mucous membranes are moist.     Pharynx: Oropharynx is clear. No oropharyngeal exudate or posterior oropharyngeal erythema.  Eyes:     General: No scleral icterus.  Right eye: No discharge.        Left eye: No discharge.     Extraocular Movements: Extraocular movements intact.     Conjunctiva/sclera: Conjunctivae normal.     Pupils: Pupils are equal, round, and reactive to light.  Neck:     Vascular: No carotid  bruit.  Cardiovascular:     Rate and Rhythm: Normal rate and regular rhythm.     Heart sounds: No murmur heard. No friction rub. No gallop.   Pulmonary:     Effort: Pulmonary effort is normal. No respiratory distress.     Breath sounds: No stridor. Wheezing present. No rhonchi or rales.  Chest:     Chest wall: No tenderness.  Abdominal:     General: Abdomen is flat. Bowel sounds are normal. There is no distension.     Palpations: There is no mass.     Tenderness: There is no abdominal tenderness. There is no right CVA tenderness, left CVA tenderness, guarding or rebound.     Hernia: No hernia is present.  Musculoskeletal:        General: No swelling, tenderness, deformity or signs of injury. Normal range of motion.     Cervical back: Normal range of motion and neck supple. No rigidity or tenderness.     Right lower leg: No edema.     Left lower leg: No edema.  Lymphadenopathy:     Cervical: No cervical adenopathy.  Skin:    General: Skin is warm and dry.     Capillary Refill: Capillary refill takes less than 2 seconds.     Coloration: Skin is not jaundiced or pale.     Findings: No bruising, erythema, lesion or rash.  Neurological:     General: No focal deficit present.     Mental Status: He is alert and oriented to person, place, and time. Mental status is at baseline.     Cranial Nerves: No cranial nerve deficit.     Sensory: No sensory deficit.     Motor: No weakness.     Coordination: Coordination normal.     Gait: Gait normal.     Deep Tendon Reflexes: Reflexes normal.  Psychiatric:        Mood and Affect: Mood normal.        Behavior: Behavior normal.        Thought Content: Thought content normal.        Judgment: Judgment normal.    Lymph nodes:   There is no cervical, clavicular, axillary or inguinal lymphadenopathy.   LABS:   CBC Latest Ref Rng & Units 06/21/2020 01/26/2016 06/27/2012  WBC - 8.4 10.3 11.3(H)  Hemoglobin 13.5 - 17.5 8.5(A) 12.6(L) 9.3(L)   Hematocrit 41 - 53 27(A) 40.0 29.1(L)  Platelets 150 - 399 224 226 289   CMP Latest Ref Rng & Units 06/21/2020 01/26/2016 06/27/2012  Glucose 65 - 99 mg/dL - 173(H) 135(H)  BUN 4 - 21 35(A) 10 19  Creatinine 0.6 - 1.3 1.3 0.75 0.90  Sodium 137 - 147 138 138 139  Potassium 3.4 - 5.3 5.0 3.6 3.6  Chloride 99 - 108 106 102 102  CO2 13 - 22 22 26 30   Calcium 8.7 - 10.7 9.0 9.3 8.1(L)  Total Protein 6.0 - 8.3 g/dL - - -  Total Bilirubin 0.3 - 1.2 mg/dL - - -  Alkaline Phos 25 - 125 83 - -  AST 14 - 40 24 - -  ALT 10 - 40 15 - -  No results found for: CEA1 / No results found for: CEA1 No results found for: PSA1 No results found for: [id number] No results found for: CAN125  No results found for: TOTALPROTELP, ALBUMINELP, A1GS, A2GS, BETS, BETA2SER, GAMS, MSPIKE, SPEI Lab Results  Component Value Date   TIBC 419 06/21/2020   FERRITIN 25.8 06/21/2020   IRONPCTSAT 8.8 06/21/2020   No results found for: LDH  STUDIES:  No results found.    HISTORY:   Past Medical History:  Diagnosis Date   Anxiety    Arthritis    Cancer ([place])    basal and squamous cell face   CHF (congestive heart failure) (HCC)    Complication of anesthesia    Diabetes mellitus without complication (HCC)    Diverticula of colon    Dysrhythmia    afib hx of   GERD (gastroesophageal reflux disease)    Hypertension    Pneumonia    10/2011   PONV (postoperative nausea and vomiting)    Stroke ([place]) 2005    Past Surgical History:  Procedure Laterality Date   BACK SURGERY  1991   EYE SURGERY     Cartaract bil   HERNIA REPAIR     Umbicilal-2011   JOINT REPLACEMENT     Bil Knee   SHOULDER FUSION      No family history on file.  Social History:  reports that he has quit smoking. He quit after 8.00 years of use. He has never used smokeless tobacco. He reports that he does not drink alcohol and does not use drugs.The patient is accompanied by son today.  Allergies:  Allergies   Allergen Reactions   Altace [Ramipril] Shortness Of Breath and Rash   Bee Venom Swelling    Mouth swelling - treated with benadryl   Hydromorphone Anaphylaxis   Pneumococcal Polysaccharide Vaccine Swelling   Fentanyl Other (See Comments)    Personality change- cried a lot (reaction to combination of versed and fentanyl)   Versed [Midazolam] Other (See Comments)    Change in personality (reaction to combination of fentanyl and versed)   Canagliflozin-Metformin Hcl Other (See Comments)    dehydration   Exenatide Other (See Comments)    Leg problems   Nsaids Other (See Comments)    Pt cannot take with xarelto unknown  Contraindicated due to Xarelto Contraindicated due to Xarelto   Pneumococcal Vaccine Swelling    Swelling at injection site   Sulfa Antibiotics Rash and Other (See Comments)    unknown  unknown    Current Medications: Current Outpatient Medications  Medication Sig Dispense Refill   ammonium lactate (AMLACTIN) 12 % cream Apply topically 3 (three) times daily as needed (spots on arm). Take as directed.     cloNIDine (CATAPRES) 0.1 MG tablet Take 1 tablet (0.1 mg total) by mouth 2 (two) times daily. 60 tablet 0   digoxin (LANOXIN) 0.125 MG tablet Take 0.125 mg by mouth daily.      esomeprazole (NEXIUM) 40 MG capsule TAKE 1 CAPSULE BY MOUTH ONCE DAILY BEFORE BREAKFAST     ferrous gluconate (FERGON) 324 MG tablet TAKE 1 TABLET BY MOUTH ONCE DAILY WITH BREAKFAST     fluticasone (FLONASE) 50 MCG/ACT nasal spray Place 2 sprays into both nostrils daily.      fluticasone furoate-vilanterol (BREO ELLIPTA) 100-25 MCG/INH AEPB Inhale into the lungs.     furosemide (LASIX) 40 MG tablet Take 40 mg by mouth daily.     HYDROcodone-acetaminophen (NORCO/VICODIN) 5-325 MG per tablet Take 1  tablet by mouth every 4 (four) hours as needed (pain).      Insulin Glargine (LANTUS SOLOSTAR) 100 UNIT/ML Solostar Pen Inject 25 Units into the skin daily at 10 pm.      labetalol (NORMODYNE) 200 MG tablet Take 100 mg by mouth 2 (two) times daily.      loratadine (CLARITIN) 10 MG tablet Take 10 mg by mouth daily.     losartan (COZAAR) 100 MG tablet Take 100 mg by mouth daily.      mesalamine (LIALDA) 1.2 G EC tablet Take 1.2 g by mouth daily with breakfast.      metFORMIN (GLUCOPHAGE) 500 MG tablet TAKE 1 TABLET BY MOUTH TWICE DAILY WITH MEALS     montelukast (SINGULAIR) 10 MG tablet Take 10 mg by mouth at bedtime.      omega-3 acid ethyl esters (LOVAZA) 1 g capsule Take 1 g by mouth 2 (two) times daily.     omeprazole (PRILOSEC) 40 MG capsule Take 40 mg by mouth daily.      potassium chloride SA (KLOR-CON M20) 20 MEQ tablet Take 20 mEq by mouth 3 (three) times daily.      predniSONE (STERAPRED UNI-PAK 48 TAB) 10 MG (48) TBPK tablet See admin instructions. see package  0   pregabalin (LYRICA) 100 MG capsule Take 100 mg by mouth See admin instructions. Take 1 capsule (100 mg) by mouth every morning, noon and 5pm (take 200 mg capsule at bedtime)     pregabalin (LYRICA) 200 MG capsule Take 200 mg by mouth at bedtime.     PRODIGY NO CODING BLOOD GLUC test strip USE 1 STRIP TO CHECK GLUCOSE TWICE DAILY     rivaroxaban (XARELTO) 20 MG TABS tablet Take 20 mg by mouth daily with supper.      simvastatin (ZOCOR) 40 MG tablet Take 20 mg by mouth at bedtime.      sitaGLIPtin-metformin (JANUMET) 50-500 MG per tablet Take 1 tablet by mouth 2 (two) times daily with a meal.      spironolactone (ALDACTONE) 25 MG tablet Take 12.5 mg by mouth daily.      No current facility-administered medications for this visit.     ASSESSMENT & PLAN:   Assessment:  Phillip Chandler is a 87 y.o. male with iron deficiency anemia. He has had increasing shortness of breath and fatigue over the last couple of weeks. His hemoglobin and iron studies today are all decreased. Of note, his lab results today are very similar to those obtained when first evaluated one year ago,  almost to the day. This further proves he had a good response to IV Feraheme in the past.   Plan: We will plan for IV Feraheme infusions to begin next week. I will obtain CXR today for full assessment. He has been instructed to report to the ED over the weekend if symptoms worsen due to his history of congestive heart failure. His son was present during this visit and understands the plan. He will return to clinic in 4 weeks to evaluate his hemoglobin and clinical condition after receiving IV iron.   The patient understands the plans discussed today and is in agreement with them.  He knows to contact our office if he develops concerns prior to his next appointment.   I provided 30 minutes of face-to-face time during this this encounter and > 50% was spent counseling as documented under my assessment and plan.    Melodye Ped, NP

## 2020-06-20 NOTE — Telephone Encounter (Signed)
06/20/20 spoke with family and sched appt

## 2020-06-21 ENCOUNTER — Other Ambulatory Visit: Payer: Self-pay

## 2020-06-21 ENCOUNTER — Inpatient Hospital Stay: Payer: Medicare Other

## 2020-06-21 ENCOUNTER — Inpatient Hospital Stay: Payer: Medicare Other | Attending: Hematology and Oncology | Admitting: Hematology and Oncology

## 2020-06-21 VITALS — BP 133/60 | HR 75 | Temp 98.4°F | Resp 20 | Ht 67.0 in

## 2020-06-21 DIAGNOSIS — R5383 Other fatigue: Secondary | ICD-10-CM | POA: Insufficient documentation

## 2020-06-21 DIAGNOSIS — R0602 Shortness of breath: Secondary | ICD-10-CM | POA: Insufficient documentation

## 2020-06-21 DIAGNOSIS — D509 Iron deficiency anemia, unspecified: Secondary | ICD-10-CM | POA: Insufficient documentation

## 2020-06-21 LAB — IRON,TIBC AND FERRITIN PANEL
%SAT: 8.8
Ferritin: 25.8
Iron: 37
TIBC: 419

## 2020-06-21 LAB — BASIC METABOLIC PANEL
BUN: 35 — AB (ref 4–21)
CO2: 22 (ref 13–22)
Chloride: 106 (ref 99–108)
Creatinine: 1.3 (ref 0.6–1.3)
Glucose: 151
Potassium: 5 (ref 3.4–5.3)
Sodium: 138 (ref 137–147)

## 2020-06-21 LAB — HEPATIC FUNCTION PANEL
ALT: 15 (ref 10–40)
AST: 24 (ref 14–40)
Alkaline Phosphatase: 83 (ref 25–125)
Bilirubin, Total: 0.7

## 2020-06-21 LAB — COMPREHENSIVE METABOLIC PANEL
Albumin: 4 (ref 3.5–5.0)
Calcium: 9 (ref 8.7–10.7)

## 2020-06-21 LAB — CBC AND DIFFERENTIAL
HCT: 27 — AB (ref 41–53)
Hemoglobin: 8.5 — AB (ref 13.5–17.5)
Neutrophils Absolute: 6.47
Platelets: 224 (ref 150–399)
WBC: 8.4

## 2020-06-21 LAB — CBC: RBC: 3.03 — AB (ref 3.87–5.11)

## 2020-06-21 NOTE — Addendum Note (Signed)
Addended by: Juanetta Beets on: 06/21/2020 11:52 AM   Modules accepted: Orders

## 2020-06-21 NOTE — Addendum Note (Signed)
Addended by: Juanetta Beets on: 06/21/2020 10:52 AM   Modules accepted: Orders

## 2020-06-25 ENCOUNTER — Inpatient Hospital Stay: Payer: Medicare Other

## 2020-06-25 ENCOUNTER — Other Ambulatory Visit: Payer: Self-pay

## 2020-06-25 DIAGNOSIS — R5383 Other fatigue: Secondary | ICD-10-CM | POA: Diagnosis not present

## 2020-06-25 DIAGNOSIS — D508 Other iron deficiency anemias: Secondary | ICD-10-CM

## 2020-06-25 DIAGNOSIS — D509 Iron deficiency anemia, unspecified: Secondary | ICD-10-CM | POA: Diagnosis not present

## 2020-06-25 DIAGNOSIS — R0602 Shortness of breath: Secondary | ICD-10-CM | POA: Diagnosis not present

## 2020-06-25 MED ORDER — SODIUM CHLORIDE 0.9 % IV SOLN
510.0000 mg | Freq: Once | INTRAVENOUS | Status: AC
  Administered 2020-06-25: 510 mg via INTRAVENOUS
  Filled 2020-06-25: qty 17

## 2020-06-25 MED ORDER — SODIUM CHLORIDE 0.9 % IV SOLN
Freq: Once | INTRAVENOUS | Status: AC
  Filled 2020-06-25: qty 250

## 2020-06-25 NOTE — Patient Instructions (Signed)
Ferumoxytol injection What is this medicine? FERUMOXYTOL is an iron complex. Iron is used to make healthy red blood cells, which carry oxygen and nutrients throughout the body. This medicine is used to treat iron deficiency anemia. This medicine may be used for other purposes; ask your health care provider or pharmacist if you have questions. COMMON BRAND NAME(S): Feraheme What should I tell my health care provider before I take this medicine? They need to know if you have any of these conditions:  anemia not caused by low iron levels  high levels of iron in the blood  magnetic resonance imaging (MRI) test scheduled  an unusual or allergic reaction to iron, other medicines, foods, dyes, or preservatives  pregnant or trying to get pregnant  breast-feeding How should I use this medicine? This medicine is for injection into a vein. It is given by a health care professional in a hospital or clinic setting. Talk to your pediatrician regarding the use of this medicine in children. Special care may be needed. Overdosage: If you think you have taken too much of this medicine contact a poison control center or emergency room at once. NOTE: This medicine is only for you. Do not share this medicine with others. What if I miss a dose? It is important not to miss your dose. Call your doctor or health care professional if you are unable to keep an appointment. What may interact with this medicine? This medicine may interact with the following medications:  other iron products This list may not describe all possible interactions. Give your health care provider a list of all the medicines, herbs, non-prescription drugs, or dietary supplements you use. Also tell them if you smoke, drink alcohol, or use illegal drugs. Some items may interact with your medicine. What should I watch for while using this medicine? Visit your doctor or healthcare professional regularly. Tell your doctor or healthcare  professional if your symptoms do not start to get better or if they get worse. You may need blood work done while you are taking this medicine. You may need to follow a special diet. Talk to your doctor. Foods that contain iron include: whole grains/cereals, dried fruits, beans, or peas, leafy green vegetables, and organ meats (liver, kidney). What side effects may I notice from receiving this medicine? Side effects that you should report to your doctor or health care professional as soon as possible:  allergic reactions like skin rash, itching or hives, swelling of the face, lips, or tongue  breathing problems  changes in blood pressure  feeling faint or lightheaded, falls  fever or chills  flushing, sweating, or hot feelings  swelling of the ankles or feet Side effects that usually do not require medical attention (report to your doctor or health care professional if they continue or are bothersome):  diarrhea  headache  nausea, vomiting  stomach pain This list may not describe all possible side effects. Call your doctor for medical advice about side effects. You may report side effects to FDA at 1-800-FDA-1088. Where should I keep my medicine? This drug is given in a hospital or clinic and will not be stored at home. NOTE: This sheet is a summary. It may not cover all possible information. If you have questions about this medicine, talk to your doctor, pharmacist, or health care provider.  2021 [employer] (2016-07-06 20:21:10)  

## 2020-06-27 ENCOUNTER — Ambulatory Visit: Payer: Medicare Other

## 2020-06-29 ENCOUNTER — Encounter: Payer: Self-pay | Admitting: Hematology and Oncology

## 2020-07-02 ENCOUNTER — Other Ambulatory Visit: Payer: Self-pay

## 2020-07-02 ENCOUNTER — Inpatient Hospital Stay: Payer: Medicare Other | Attending: Oncology

## 2020-07-02 VITALS — BP 128/74 | HR 77 | Temp 98.1°F | Resp 18

## 2020-07-02 DIAGNOSIS — R5383 Other fatigue: Secondary | ICD-10-CM | POA: Diagnosis not present

## 2020-07-02 DIAGNOSIS — R0602 Shortness of breath: Secondary | ICD-10-CM | POA: Insufficient documentation

## 2020-07-02 DIAGNOSIS — D508 Other iron deficiency anemias: Secondary | ICD-10-CM

## 2020-07-02 DIAGNOSIS — D509 Iron deficiency anemia, unspecified: Secondary | ICD-10-CM | POA: Insufficient documentation

## 2020-07-02 MED ORDER — SODIUM CHLORIDE 0.9 % IV SOLN
510.0000 mg | Freq: Once | INTRAVENOUS | Status: AC
  Administered 2020-07-02: 510 mg via INTRAVENOUS
  Filled 2020-07-02: qty 17

## 2020-07-02 MED ORDER — SODIUM CHLORIDE 0.9 % IV SOLN
Freq: Once | INTRAVENOUS | Status: AC
  Filled 2020-07-02: qty 250

## 2020-07-02 NOTE — Patient Instructions (Signed)
Ferumoxytol injection What is this medicine? FERUMOXYTOL is an iron complex. Iron is used to make healthy red blood cells, which carry oxygen and nutrients throughout the body. This medicine is used to treat iron deficiency anemia. This medicine may be used for other purposes; ask your health care provider or pharmacist if you have questions. COMMON BRAND NAME(S): Feraheme What should I tell my health care provider before I take this medicine? They need to know if you have any of these conditions:  anemia not caused by low iron levels  high levels of iron in the blood  magnetic resonance imaging (MRI) test scheduled  an unusual or allergic reaction to iron, other medicines, foods, dyes, or preservatives  pregnant or trying to get pregnant  breast-feeding How should I use this medicine? This medicine is for injection into a vein. It is given by a health care professional in a hospital or clinic setting. Talk to your pediatrician regarding the use of this medicine in children. Special care may be needed. Overdosage: If you think you have taken too much of this medicine contact a poison control center or emergency room at once. NOTE: This medicine is only for you. Do not share this medicine with others. What if I miss a dose? It is important not to miss your dose. Call your doctor or health care professional if you are unable to keep an appointment. What may interact with this medicine? This medicine may interact with the following medications:  other iron products This list may not describe all possible interactions. Give your health care provider a list of all the medicines, herbs, non-prescription drugs, or dietary supplements you use. Also tell them if you smoke, drink alcohol, or use illegal drugs. Some items may interact with your medicine. What should I watch for while using this medicine? Visit your doctor or healthcare professional regularly. Tell your doctor or healthcare  professional if your symptoms do not start to get better or if they get worse. You may need blood work done while you are taking this medicine. You may need to follow a special diet. Talk to your doctor. Foods that contain iron include: whole grains/cereals, dried fruits, beans, or peas, leafy green vegetables, and organ meats (liver, kidney). What side effects may I notice from receiving this medicine? Side effects that you should report to your doctor or health care professional as soon as possible:  allergic reactions like skin rash, itching or hives, swelling of the face, lips, or tongue  breathing problems  changes in blood pressure  feeling faint or lightheaded, falls  fever or chills  flushing, sweating, or hot feelings  swelling of the ankles or feet Side effects that usually do not require medical attention (report to your doctor or health care professional if they continue or are bothersome):  diarrhea  headache  nausea, vomiting  stomach pain This list may not describe all possible side effects. Call your doctor for medical advice about side effects. You may report side effects to FDA at 1-800-FDA-1088. Where should I keep my medicine? This drug is given in a hospital or clinic and will not be stored at home. NOTE: This sheet is a summary. It may not cover all possible information. If you have questions about this medicine, talk to your doctor, pharmacist, or health care provider.  2021 [employer] (2016-07-06 20:21:10)  

## 2020-07-26 ENCOUNTER — Ambulatory Visit: Payer: Medicare Other | Admitting: Sports Medicine

## 2020-08-02 ENCOUNTER — Inpatient Hospital Stay: Payer: Medicare Other

## 2020-08-02 ENCOUNTER — Inpatient Hospital Stay: Payer: Medicare Other | Admitting: Oncology

## 2020-08-14 ENCOUNTER — Telehealth: Payer: Self-pay | Admitting: Oncology

## 2020-08-14 NOTE — Telephone Encounter (Signed)
Per daughter, patient is under [place] - Please cancel all Appt's

## 2020-08-16 ENCOUNTER — Ambulatory Visit: Payer: Medicare Other | Admitting: Sports Medicine

## 2020-08-23 ENCOUNTER — Ambulatory Visit: Payer: Medicare Other | Admitting: Oncology

## 2020-08-23 ENCOUNTER — Other Ambulatory Visit: Payer: Medicare Other

## 2020-08-30 DEATH — deceased
